# Patient Record
Sex: Male | Born: 1950 | Race: White | Hispanic: No | Marital: Married | Smoking: Current every day smoker
Health system: Southern US, Community
[De-identification: ages and names within clinical notes are randomized; demographics above are authoritative.]

## PROBLEM LIST (undated history)

## (undated) DIAGNOSIS — N289 Disorder of kidney and ureter, unspecified: Secondary | ICD-10-CM

## (undated) DIAGNOSIS — E079 Disorder of thyroid, unspecified: Secondary | ICD-10-CM

## (undated) DIAGNOSIS — C229 Malignant neoplasm of liver, not specified as primary or secondary: Secondary | ICD-10-CM

## (undated) DIAGNOSIS — I639 Cerebral infarction, unspecified: Secondary | ICD-10-CM

## (undated) DIAGNOSIS — I10 Essential (primary) hypertension: Secondary | ICD-10-CM

## (undated) DIAGNOSIS — E119 Type 2 diabetes mellitus without complications: Secondary | ICD-10-CM

## (undated) DIAGNOSIS — E785 Hyperlipidemia, unspecified: Secondary | ICD-10-CM

## (undated) HISTORY — PX: HAND TENDON SURGERY: SHX663

## (undated) HISTORY — DX: Cerebral infarction, unspecified: I63.9

## (undated) HISTORY — DX: Malignant neoplasm of liver, not specified as primary or secondary: C22.9

## (undated) HISTORY — DX: Disorder of kidney and ureter, unspecified: N28.9

## (undated) HISTORY — PX: CARDIAC CATHETERIZATION: SHX172

## (undated) HISTORY — PX: CARPAL TUNNEL RELEASE: SHX101

---

## 2001-07-02 DIAGNOSIS — M7710 Lateral epicondylitis, unspecified elbow: Secondary | ICD-10-CM | POA: Insufficient documentation

## 2003-01-09 DIAGNOSIS — I10 Essential (primary) hypertension: Secondary | ICD-10-CM | POA: Insufficient documentation

## 2004-08-03 ENCOUNTER — Emergency Department (HOSPITAL_COMMUNITY): Admission: EM | Admit: 2004-08-03 | Discharge: 2004-08-03 | Payer: Self-pay | Admitting: Emergency Medicine

## 2005-09-22 DIAGNOSIS — I447 Left bundle-branch block, unspecified: Secondary | ICD-10-CM | POA: Insufficient documentation

## 2006-03-14 ENCOUNTER — Emergency Department (HOSPITAL_COMMUNITY): Admission: EM | Admit: 2006-03-14 | Discharge: 2006-03-14 | Payer: Self-pay | Admitting: Emergency Medicine

## 2006-03-14 IMAGING — CR DG KNEE COMPLETE 4+V*L*
4 series · 4 of 4 positions shown · non-contrast
Comparison: none

CLINICAL DATA: Left knee injury, pain, limited range of motion

LEFT KNEE - 4 VIEW

[t knee lat left]
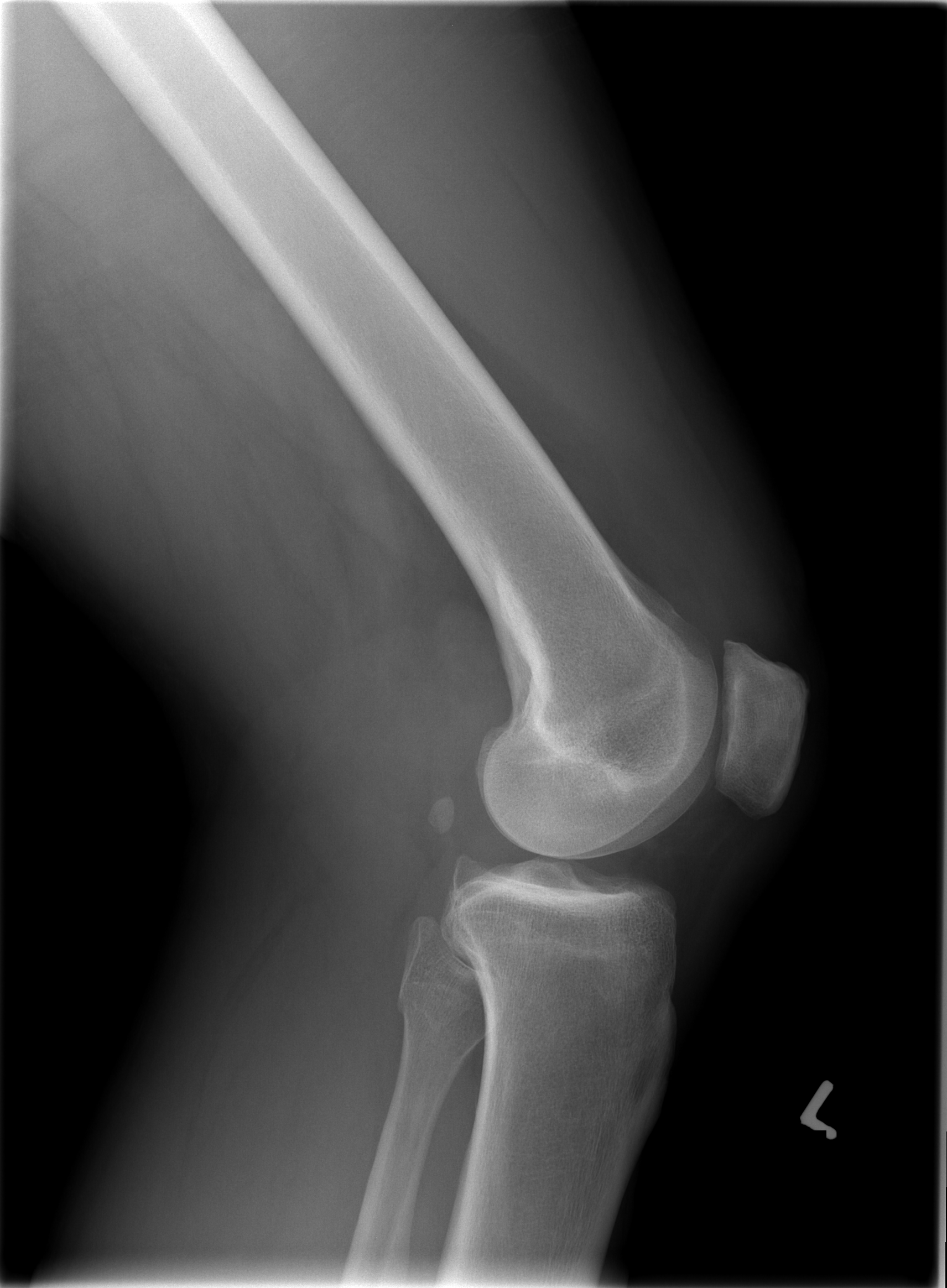

[t knee oblique left (1 of 2)]
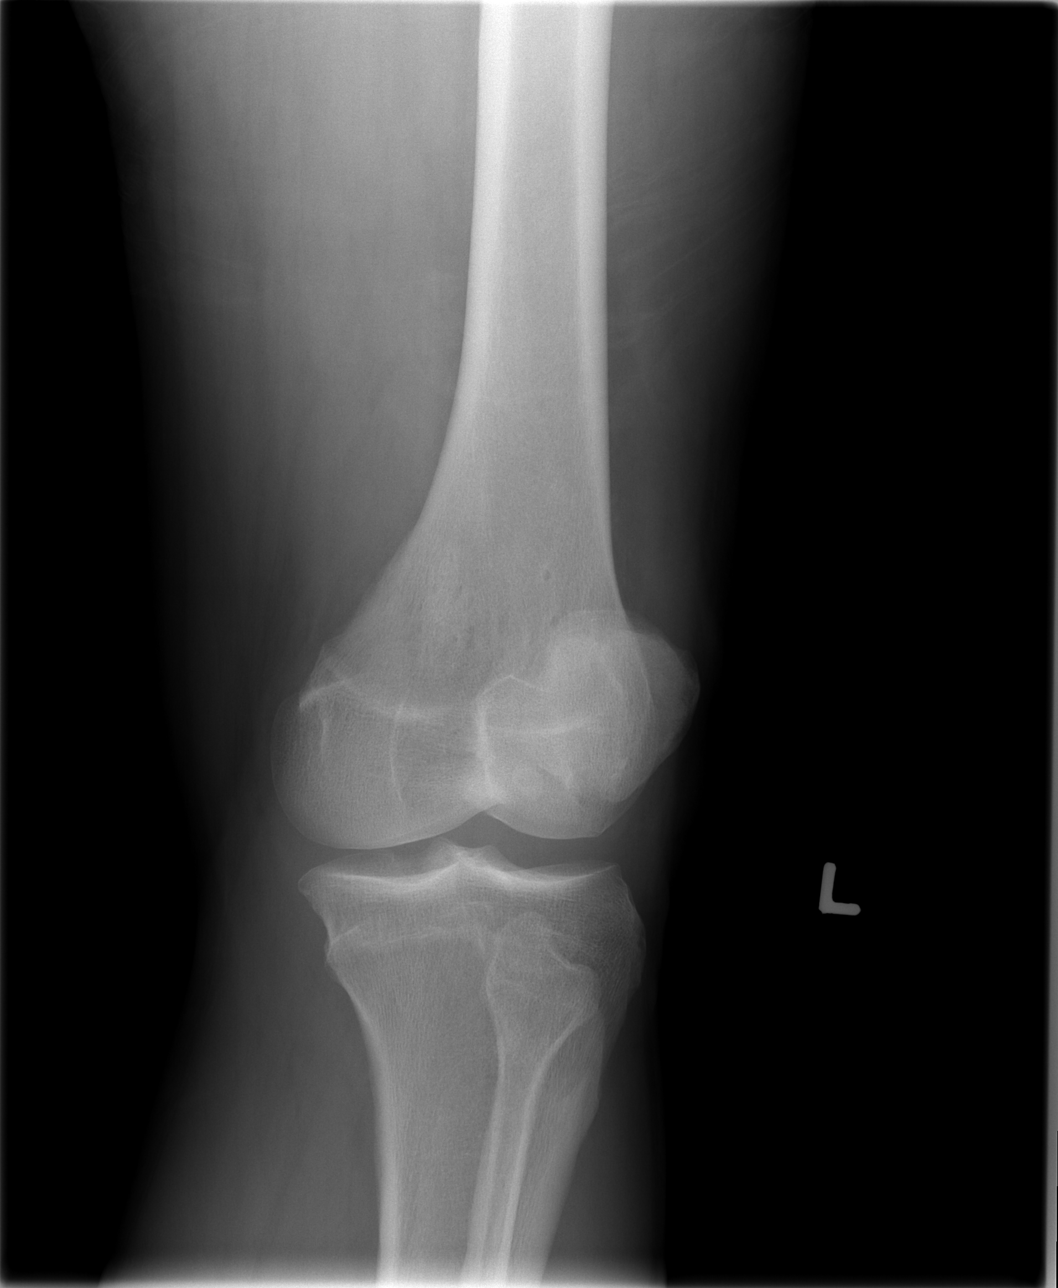

[t knee ap left]
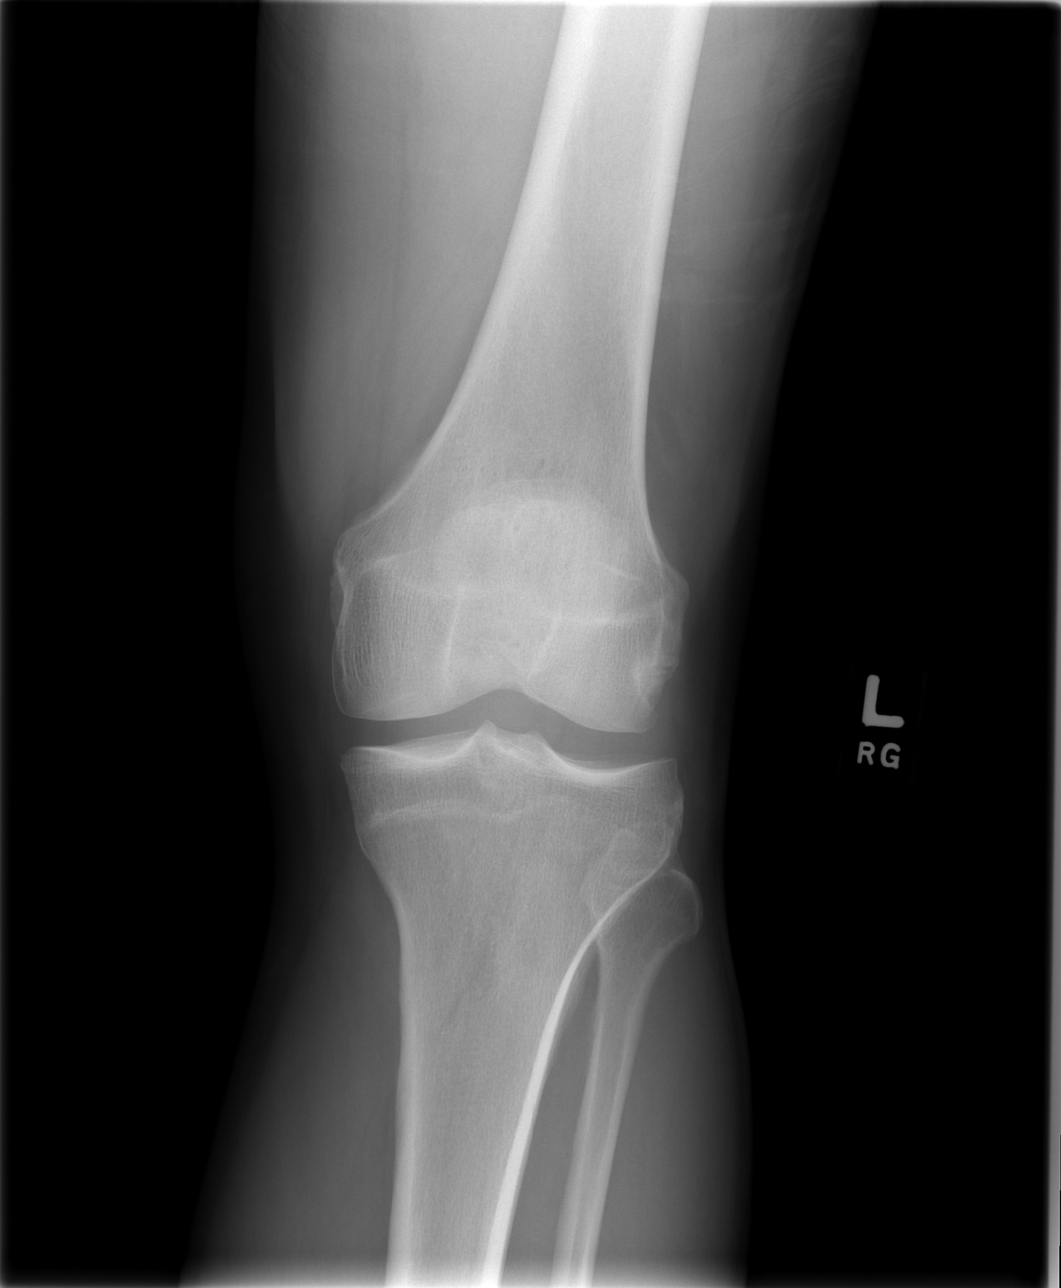

[t knee oblique left (2 of 2)]
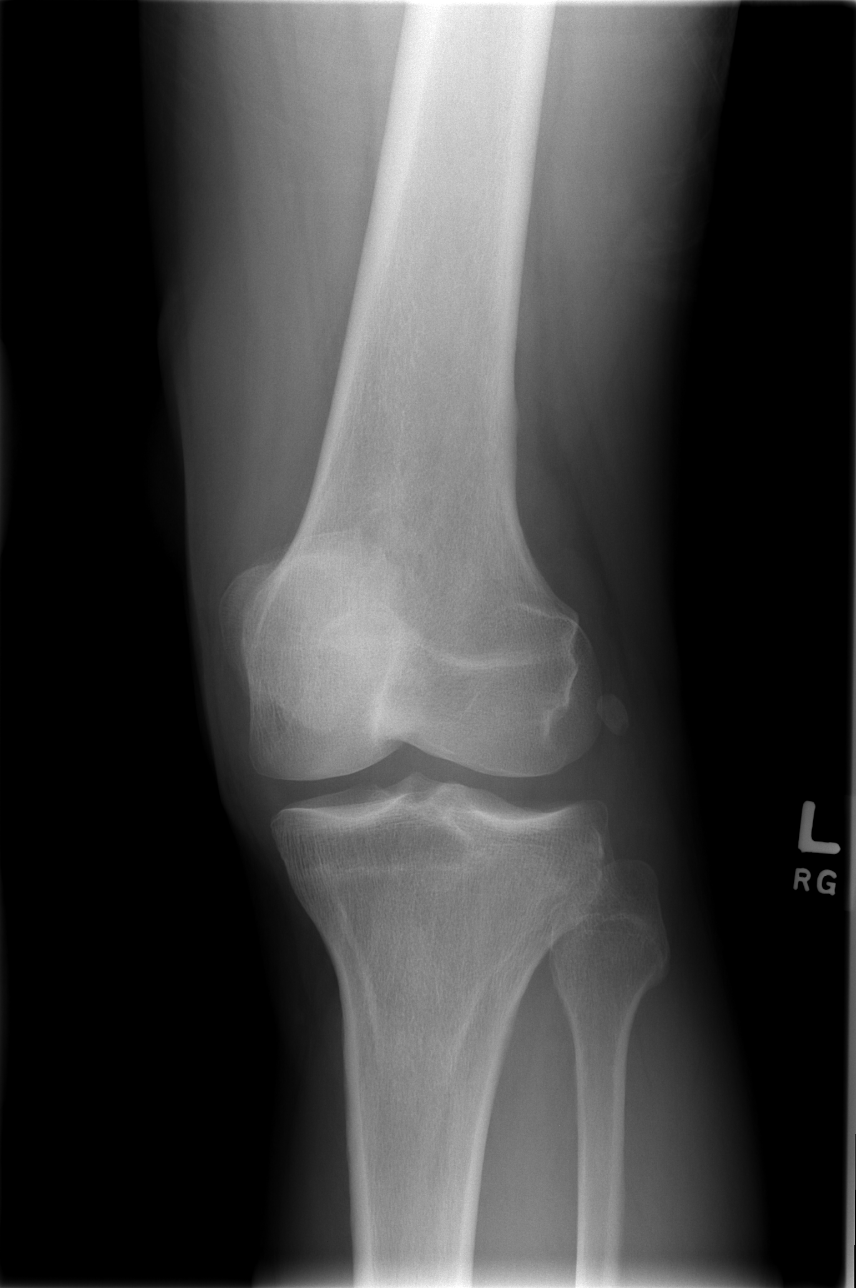

[4 of 4 positions shown; findings below may reference images not displayed]

FINDINGS: No acute bony abnormality. Specifically, no fracture, subluxation, or
dislocation. Soft tissues intact.

IMPRESSION

No acute bony abnormality.

## 2006-12-19 DIAGNOSIS — E669 Obesity, unspecified: Secondary | ICD-10-CM | POA: Insufficient documentation

## 2007-03-23 IMAGING — CR DG CHEST 1V PORT
1 series · 1 of 1 positions shown · non-contrast
Comparison: No priors for comparison.

CLINICAL DATA: Syncope and shortness of breath.
 PORTABLE CHEST - 1 VIEW:

[view not recorded]
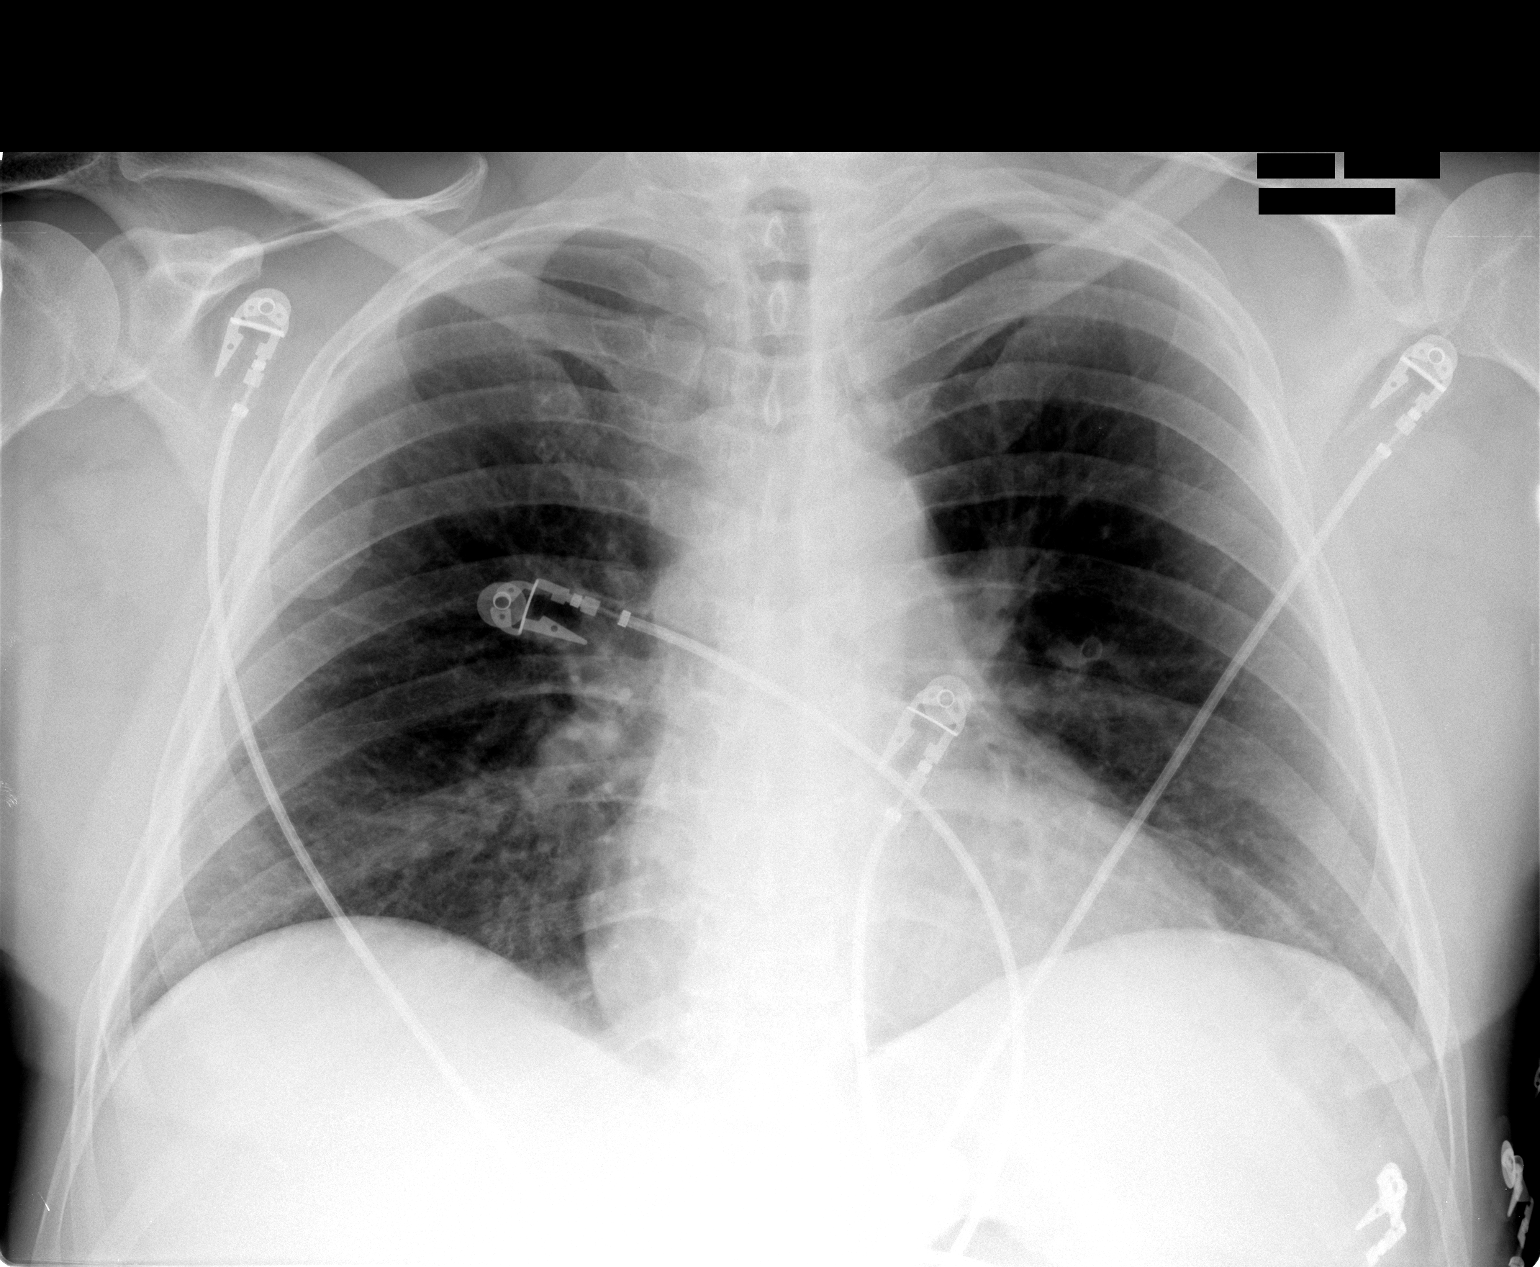

[1 of 1 positions shown; findings below may reference images not displayed]

FINDINGS: The lung volumes are slightly low.  Minimal linear atelectasis in the lingula.  Heart and mediastinal contours are within normal limits for portable technique.  No pneumothorax, focal airspace disease, edema, or effusion.
IMPRESSION: Low lung volumes with minimal atelectasis in the lingula.

## 2007-09-23 ENCOUNTER — Ambulatory Visit: Payer: Self-pay | Admitting: Internal Medicine

## 2007-12-16 ENCOUNTER — Emergency Department (HOSPITAL_COMMUNITY): Admission: EM | Admit: 2007-12-16 | Discharge: 2007-12-16 | Payer: Self-pay | Admitting: Emergency Medicine

## 2008-09-25 ENCOUNTER — Ambulatory Visit: Payer: Self-pay | Admitting: Gastroenterology

## 2008-10-19 ENCOUNTER — Ambulatory Visit: Payer: Self-pay | Admitting: Gastroenterology

## 2008-12-01 ENCOUNTER — Observation Stay (HOSPITAL_COMMUNITY): Admission: AD | Admit: 2008-12-01 | Discharge: 2008-12-02 | Payer: Self-pay | Admitting: Cardiology

## 2008-12-08 DIAGNOSIS — R945 Abnormal results of liver function studies: Secondary | ICD-10-CM | POA: Insufficient documentation

## 2008-12-10 DIAGNOSIS — E559 Vitamin D deficiency, unspecified: Secondary | ICD-10-CM | POA: Insufficient documentation

## 2009-04-16 ENCOUNTER — Encounter: Admission: RE | Admit: 2009-04-16 | Discharge: 2009-04-16 | Payer: Self-pay | Admitting: Orthopedic Surgery

## 2009-08-20 ENCOUNTER — Emergency Department (HOSPITAL_COMMUNITY): Admission: EM | Admit: 2009-08-20 | Discharge: 2009-08-20 | Payer: Self-pay | Admitting: Emergency Medicine

## 2009-08-20 IMAGING — CR DG CHEST 2V
2 series · 2 of 2 positions shown · non-contrast
Comparison: [DATE]

CLINICAL DATA: Hypertensive.  Diaphoretic.  Smoker.

CHEST - 2 VIEW

[w chest pa]
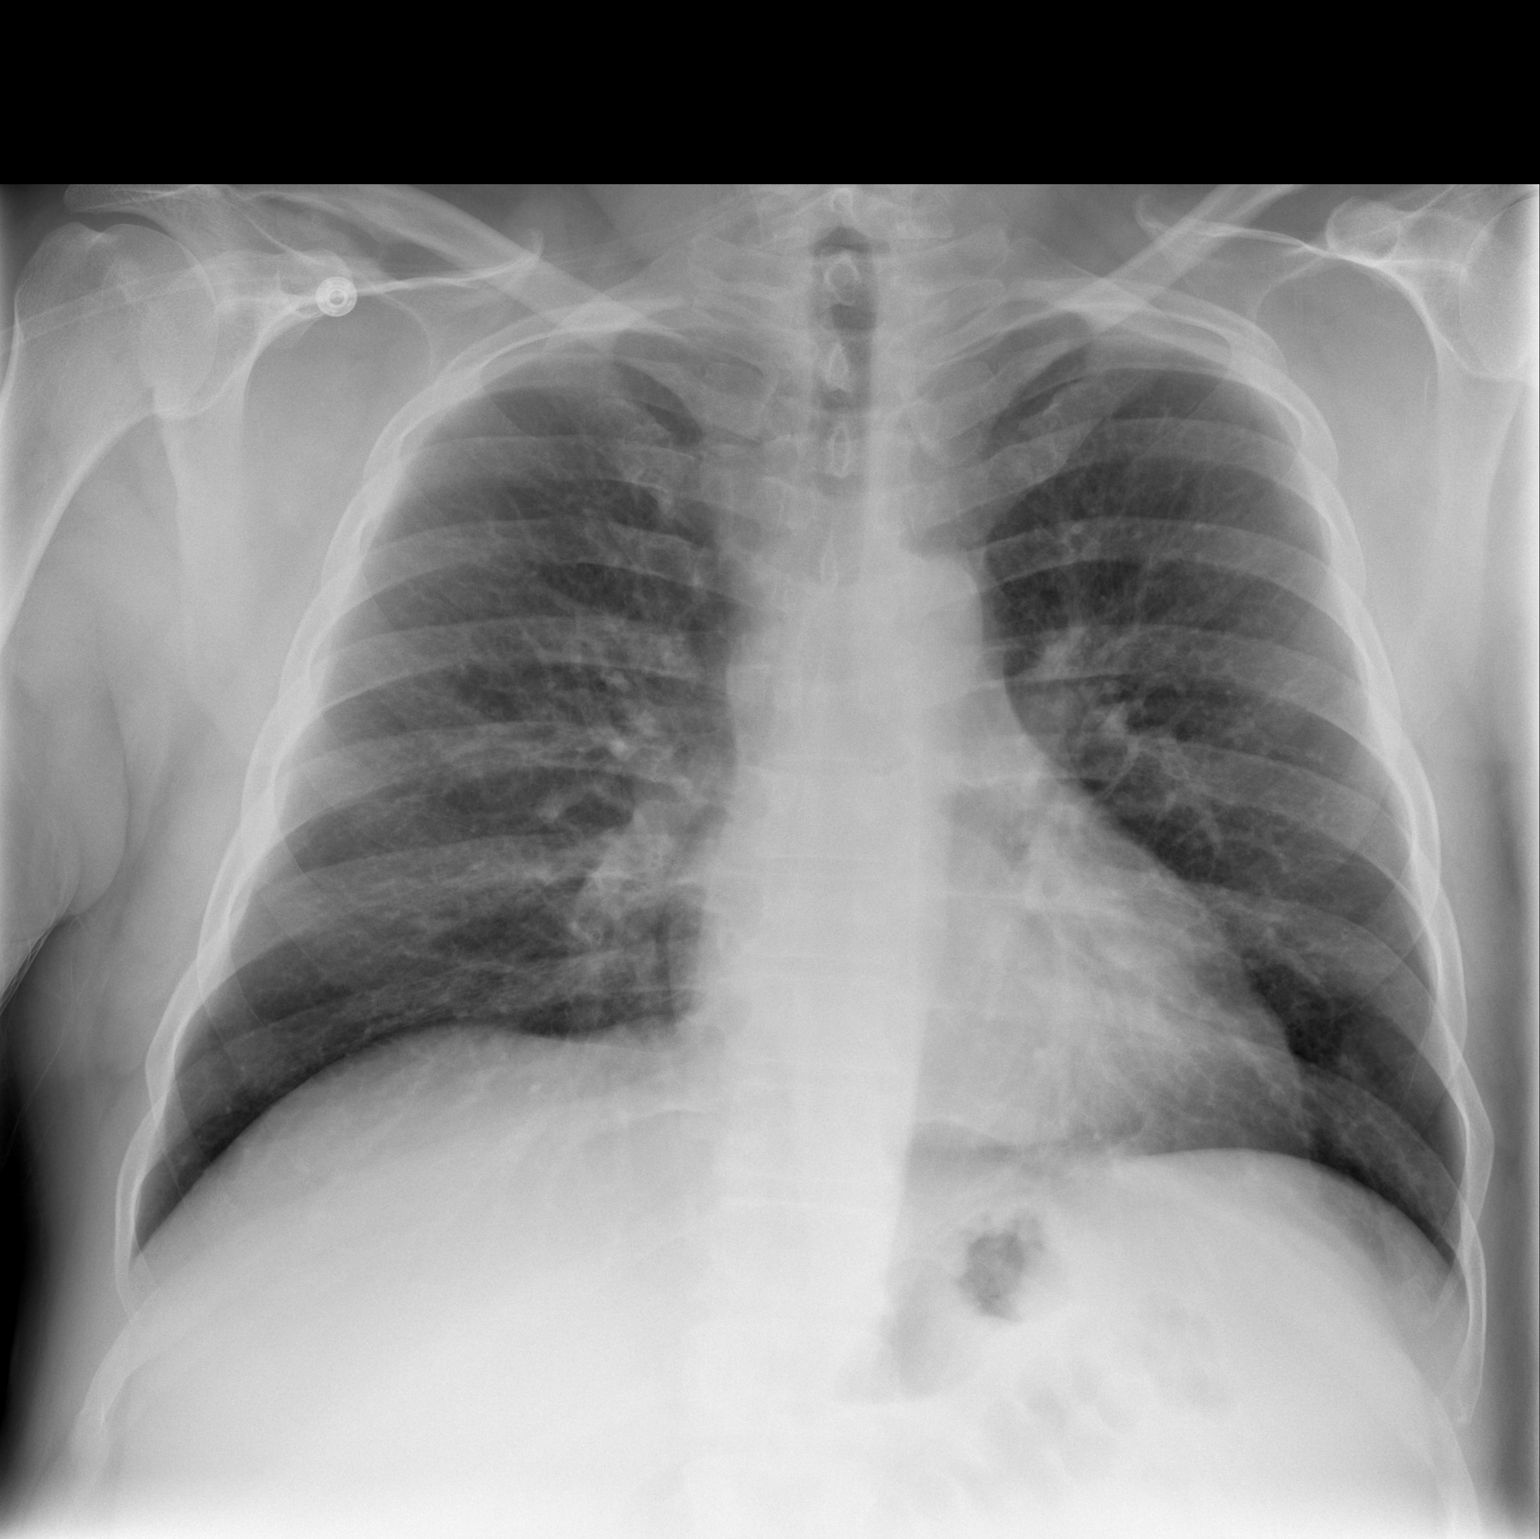

[w chest lat]
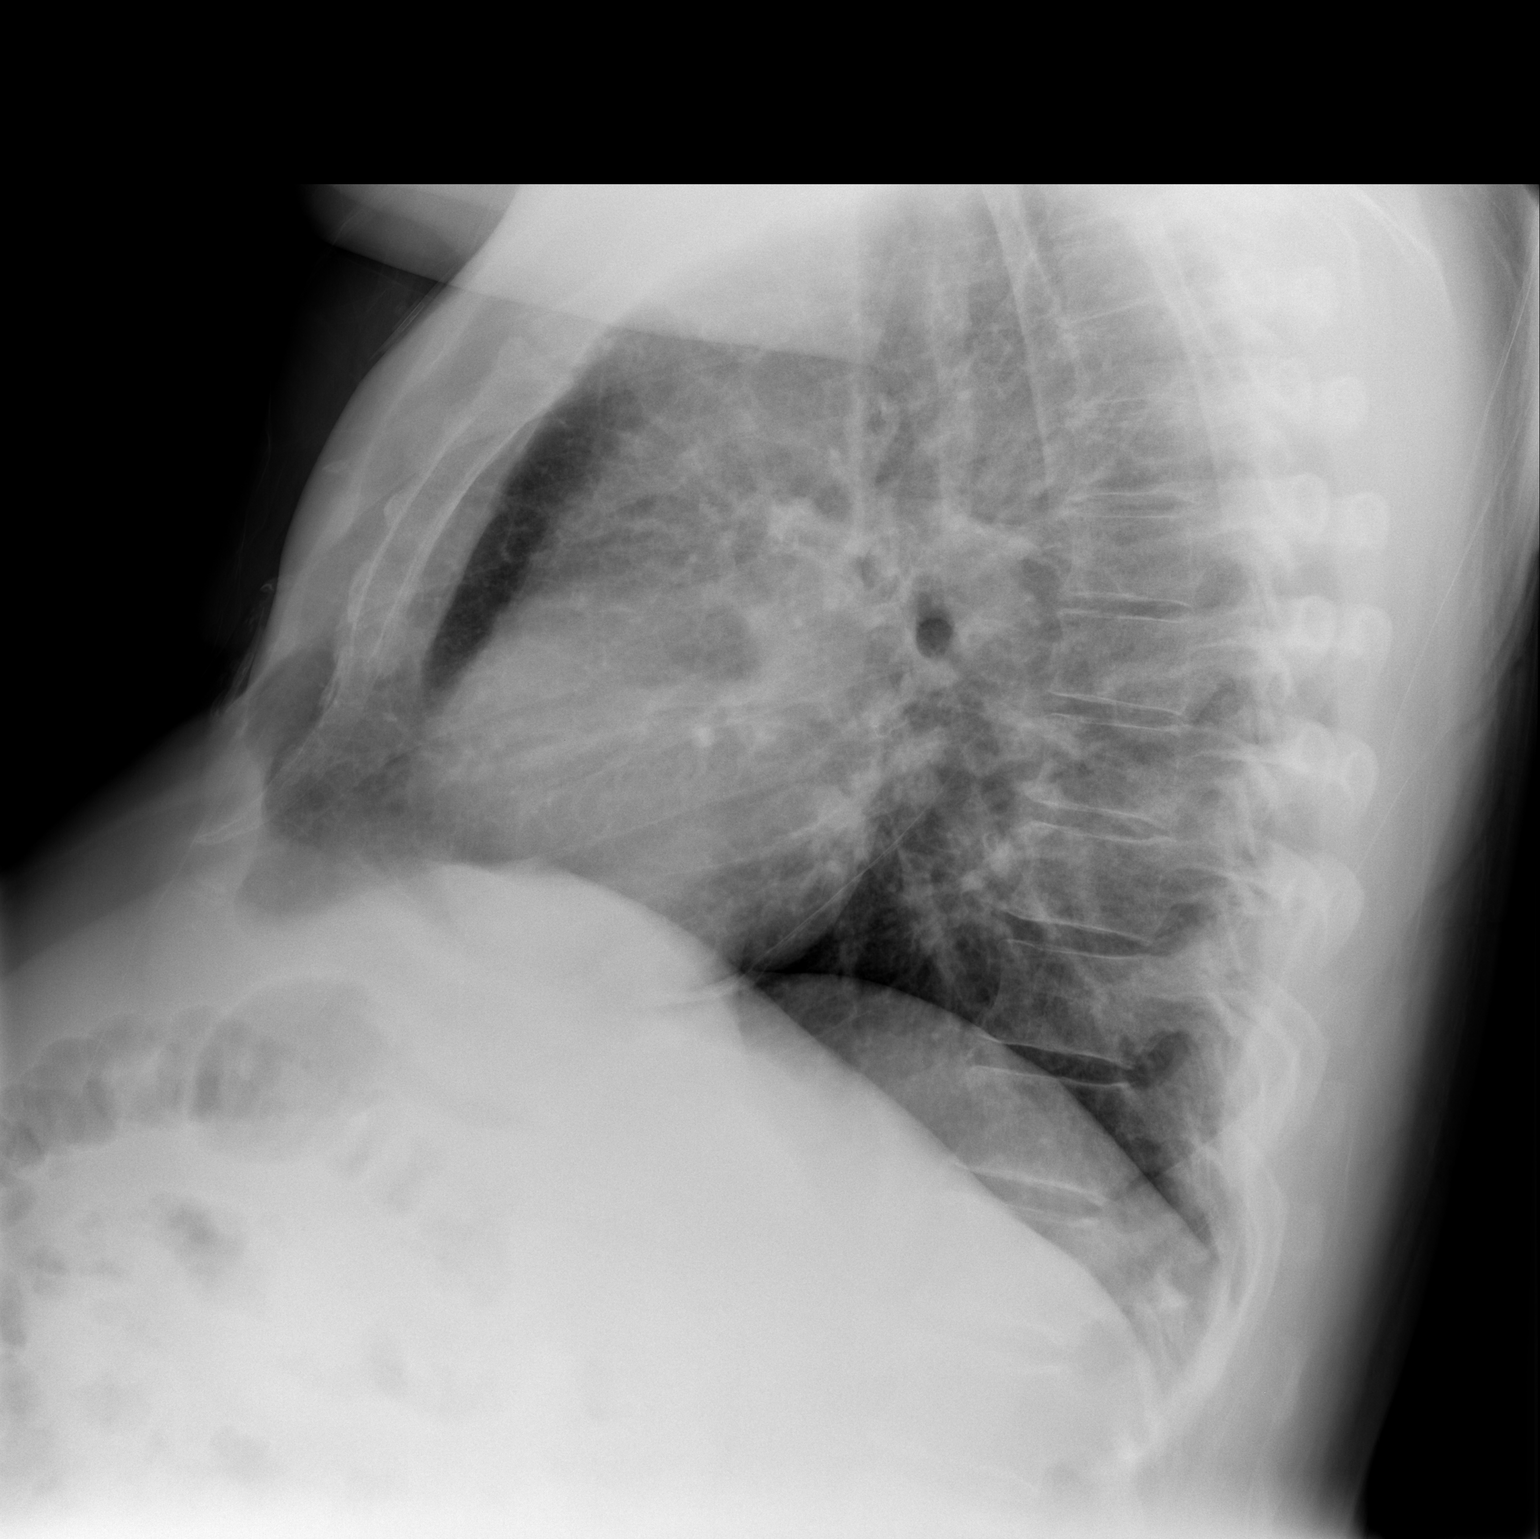

[2 of 2 positions shown; findings below may reference images not displayed]

FINDINGS: The heart size and mediastinal contours are within
normal limits.  Both lungs are clear.  The visualized skeletal
structures are unremarkable.
IMPRESSION: No active cardiopulmonary disease.

## 2011-03-03 LAB — POCT CARDIAC MARKERS
CKMB, poc: 1.4 ng/mL (ref 1.0–8.0)
Troponin i, poc: <0.05 ng/mL (ref 0.00–0.09)

## 2011-03-03 LAB — POCT I-STAT, CHEM 8
HCT: 39 % (ref 39.0–52.0)
Hemoglobin: 13.3 g/dL (ref 13.0–17.0)

## 2011-03-03 LAB — GLUCOSE, CAPILLARY: Glucose-Capillary: 118 mg/dL — ABNORMAL HIGH (ref 70–99)

## 2011-03-13 LAB — COMPREHENSIVE METABOLIC PANEL
AST: 45 U/L — ABNORMAL HIGH (ref 0–37)
AST: 53 U/L — ABNORMAL HIGH (ref 0–37)
Albumin: 3.8 g/dL (ref 3.5–5.2)
Alkaline Phosphatase: 47 U/L (ref 39–117)
Alkaline Phosphatase: 52 U/L (ref 39–117)
BUN: 14 mg/dL (ref 6–23)
BUN: 17 mg/dL (ref 6–23)
CO2: 24 mEq/L (ref 19–32)
CO2: 27 mEq/L (ref 19–32)
Calcium: 9.1 mg/dL (ref 8.4–10.5)
Chloride: 102 mEq/L (ref 96–112)
Creatinine, Ser: 0.82 mg/dL (ref 0.4–1.5)
GFR calc Af Amer: >60 mL/min (ref >60)
GFR calc non Af Amer: >60 mL/min (ref >60)
Glucose, Bld: 162 mg/dL — ABNORMAL HIGH (ref 70–99)
Potassium: 4.3 mEq/L (ref 3.5–5.1)
Sodium: 136 mEq/L (ref 135–145)
Total Protein: 6.5 g/dL (ref 6.0–8.3)
Total Protein: 6.6 g/dL (ref 6.0–8.3)

## 2011-03-13 LAB — DIFFERENTIAL
Basophils Absolute: 0 10*3/uL (ref 0.0–0.1)
Basophils Relative: 0 % (ref 0–1)
Lymphocytes Relative: 11 % — ABNORMAL LOW (ref 12–46)
Monocytes Absolute: 0.7 10*3/uL (ref 0.1–1.0)
Monocytes Relative: 7 % (ref 3–12)
Neutrophils Relative %: 82 % — ABNORMAL HIGH (ref 43–77)

## 2011-03-13 LAB — TSH: TSH: 1.606 u[IU]/mL (ref 0.350–4.500)

## 2011-03-13 LAB — GLUCOSE, CAPILLARY
Glucose-Capillary: 128 mg/dL — ABNORMAL HIGH (ref 70–99)
Glucose-Capillary: 161 mg/dL — ABNORMAL HIGH (ref 70–99)
Glucose-Capillary: 173 mg/dL — ABNORMAL HIGH (ref 70–99)

## 2011-03-13 LAB — URINALYSIS, ROUTINE W REFLEX MICROSCOPIC
Glucose, UA: NEGATIVE mg/dL
Ketones, ur: NEGATIVE mg/dL
Protein, ur: NEGATIVE mg/dL
Specific Gravity, Urine: >1.046 — ABNORMAL HIGH (ref 1.005–1.030)
pH: 5 (ref 5.0–8.0)

## 2011-03-13 LAB — CBC
MCHC: 35.1 g/dL (ref 30.0–36.0)
MCV: 91 fL (ref 78.0–100.0)
Platelets: 216 10*3/uL (ref 150–400)
RBC: 4.34 MIL/uL (ref 4.22–5.81)
RDW: 13 % (ref 11.5–15.5)

## 2011-03-13 LAB — MAGNESIUM: Magnesium: 1.6 mg/dL (ref 1.5–2.5)

## 2011-03-13 LAB — LIPID PANEL
HDL: 30 mg/dL — ABNORMAL LOW (ref >39)
Total CHOL/HDL Ratio: 5.3 RATIO

## 2011-04-11 NOTE — H&P (Signed)
NAMESHAE, AUGELLO                 ACCOUNT NO.:  [id number]   MEDICAL RECORD NO.:  [id number]          PATIENT TYPE:  OIB   LOCATION:  2028                         FACILITY:  [facility]   PHYSICIAN:  Brentyn Seehafer. Jacinto Halim, MD       DATE OF BIRTH:  27-Apr-1951   DATE OF ADMISSION:  12/01/2008  DATE OF DISCHARGE:                              HISTORY & PHYSICAL   [name] is a 60 year old who came in as an acute MI.  He  apparently had a lot of alcohol to drink last night including 6 Corona's  and whiskey.  At 6 a.m. he woke up and he felt nauseated.  He tried to  throw up, he felt weak like he might pass out.  He was diaphoretic.  He  had some chest discomfort.  He called EMS.  EMS did a 12-lead and a code  STEMI was called.  He got 81 mg of aspirin x4 and 3 sublingual  nitroglycerin.   PRIOR MEDICAL HISTORY:  1. Hypertension.  2. NIDDM.  3. Anxiety.  4. Hyperlipidemia.   MEDICATIONS:  1. Lipitor 20 mg everyday.  2. Glimepiride 2 mg b.i.d.  3. Lovaza 1 g two b.i.d.  4. Hydrochlorothiazide 25 mg everyday.  5. Metformin 1000 mg b.i.d.  6. Actos 30 mg everyday.  7. Diovan 160 mg everyday.  8. Aspirin 81 mg everyday.  9. Multivitamin everyday.   ALLERGIES:  NKDA.   FAMILY HISTORY:  Mother died at age 45 of colon cancer.  Father had an  MI at age 33 and died at age 51, one brother died of electrocution, one  brother  is alive and one sister alive with no CAD.   SOCIAL HISTORY:  He quit smoking 5 months ago.  He is married, has 3  children, 5 grandchildren.  He is starting a new job on Thursday.  He is  a truck Sales promotion account executive.  He has been walking everyday one to one  and a half miles everyday.  He moved to [place] about 4 years ago  from [place], [place].  He still sees his primary care doctor, Dr.  Sondra Barges in [place].   REVIEW OF SYSTEMS:  He denies any recent chest pain, shortness of  breath.  No syncope, no presyncope, no dizziness, no lightheadedness, no  indigestion or heartburn.  No black tarry stools.  No lower extremity  edema.  No unilateral weakness.  Other systems are negative.   PHYSICAL EXAMINATION:  VITAL SIGNS:  Blood pressure 130/70, heart rate  76.  GENERAL:  He is an obese male in no acute distress.  He was rather  nauseous.  HEENT:  Pupils equal and round.  NECK:  No thyromegaly, no adenopathy.  LUNGS:  Clear to auscultation with fair inspiratory effort.  CARDIAC:  Heart sounds regular, S1-S2 present.  No murmur or gallop is  noted.  GI:  Bowel sounds present x4.  No mass or tenderness.  LOWER EXTREMITIES:  No edema.  NEURO:  Alert and oriented x3.  MUSCULOSKELETAL:  Moves all extremities x4.  Lower extremities, no  edema.   EKG shows left bundle branch block.   ASSESSMENT:  1. New left bundle branch block.  2. Nausea, weakness and presyncope.  3. Hypertension.  4. Non-insulin-dependent diabetes mellitus.  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   PLAN:  He was seen and evaluated by Dr. Jacinto Halim prior to emergent cardiac  catheterization.  The risks and benefits explained including death, MI,  stroke or need for urgent cardiac catheterization.      [name], N.P.      Cristy Hilts. Jacinto Halim, MD  Electronically Signed    BB/MEDQ  D:  12/01/2008  T:  12/01/2008  Job:  [id number]   cc:   Sondra Barges, MD

## 2011-04-11 NOTE — Discharge Summary (Signed)
NAME:  Bryce Adams, Bryce Adams                 ACCOUNT NO.:  [id number]   MEDICAL RECORD NO.:  [id number]          PATIENT TYPE:  OIB   LOCATION:  2028                         FACILITY:  [facility]   PHYSICIAN:  Thereasa Solo. Little, M.D. DATE OF BIRTH:  05/09/1951   DATE OF ADMISSION:  12/01/2008  DATE OF DISCHARGE:  12/02/2008                               DISCHARGE SUMMARY   DISCHARGE DIAGNOSES:  1. Chest pain, not coronary artery ischemia with cardiac      catheterization done on December 01, 2008 with normal coronary      arteries, normal left ventricular function, ejection fraction of 60-      65%.  2. Hypertension.  3. Non-insulin-dependent diabetes mellitus.  4. Familial history of coronary artery disease.   LABORATORY DATA:  Sodium 136, potassium 4.3, chloride 102, CO2 27,  glucose 179, BUN 14, and creatinine 0.80.  Hemoglobin 13.6, hematocrit  38.7, WBCs 9.2, and platelets 234.  TSH 1.606.  Hemoglobin A1c 7.3.  Total cholesterol was 159, triglycerides 258, HDL 30, and LDL was 77.  Magnesium 1.6.  Alcohol level less than 5.  UA was negative.  Chest x-  ray not in computer at the time of this dictation.   HOSPITAL COURSE:  [name] is a 60 year old male patient who came  in by EMS as a code STEMI.  He had been doing well at home in good  health until he drank a lot of alcohol and in the morning he had some  chest discomfort with diaphoresis and a near syncopal episode.  Thus,  EMS was called to his house and he apparently had an EKG that showed  left bundle-branch block and because of his multiple cardiac risk  factors, a code STEMI was called.  He was brought to the Cardiac  Catheterization Lab, where he was seen by Dr. Jacinto Halim and a cardiac  catheterization was performed.  This showed normal coronary arteries and  EF of 60-65%.  He was hydrated overnight and ready for discharge on  December 02, 2008.  His blood pressure was 143/85, heart rate 86,  respirations 20, and temperature was 97.2.   His renal function was  normal and he was considered stable for discharge home.  He was seen by  Dr. Julieanne Manson on the day of discharge.   DISCHARGE MEDICATIONS:  1. Hydrochlorothiazide 25 mg daily.  2. Lovaza 1 gram twice a day.  3. Lipitor 20 mg daily.  4. Multivitamin 1 tablet daily.  5. Glyburide, he takes on a p.r.n. basis for his blood sugar.  6. Metformin 1000 mg twice a day.  He was told to hold this until      Thursday night.  7. Actos 30 mg a day.  8. Diovan 160 mg.  9. Aspirin 81 mg a day.   He was told he can take Tylenol for any groin discomfort.  If he has any  problems with his groin, he may call his office and number was given to  him.  He should follow up with his physician, Dr. Kathlee Nations in  [place],  [place].  He should do no strenuous activity until Sunday and he should  do no driving today.  He is to start a new job on Thursday, however, he  will be in orientation until Sunday.  He had a [employer] procedure and  he should not sit in a tub of water for 7 days.      [name], N.P.    ______________________________  Thereasa Solo. Little, M.D.    BB/MEDQ  D:  12/02/2008  T:  12/02/2008  Job:  [id number]   cc:   Kathlee Nations

## 2011-04-11 NOTE — Cardiovascular Report (Signed)
NAMEAVIV, LENGACHER                 ACCOUNT NO.:  [id number]   MEDICAL RECORD NO.:  [id number]          PATIENT TYPE:  OIB   LOCATION:  2028                         FACILITY:  [facility]   PHYSICIAN:  Keyondre Hepburn. Jacinto Halim, MD       DATE OF BIRTH:  1951/02/19   DATE OF PROCEDURE:  DATE OF DISCHARGE:                            CARDIAC CATHETERIZATION   PROCEDURE DATE:  December 01, 2008.   PROCEDURES PERFORMED:  1. Left ventriculography.  2. Selective right and left coronary arteriography.  3. Ascending aortogram.  4. Abdominal aortogram.   INDICATIONS:  Mr. Bala is a 60 year old obese white male, who lives  in [place], [place], with a history of hypertension, diabetes,  hyperlipidemia.  He was doing well until this morning when he woke up  feeling hung over from last night binge party.  He also had some mild  chest discomfort associated with diaphoresis and had near syncopal  episode.  He had activated the EMS, and the EMS found that he had a left  bundle-branch block.  Given his multiple cardiovascular risk factors, a  STEMI code was activated, and he was brought directly to the cardiac  catheterization lab as a STEMI.   A left heart catheterization is now being performed for definite  diagnosis of coronary disease.  Ascending aortogram was performed to  evaluate for ascending aortic aneurysm and aortic dissection.  Abdominal  aortogram for abdominal atherosclerosis.  He was also hypertensive on  admission with a diastolic blood pressure of 98 mmHg.   HEMODYNAMIC DATA:  The left ventricular pressure was 125/-2 with an end-  diastolic pressure of 4 mmHg.  Aortic pressure was 123/69 with a mean of  90 mmHg.  There was no pressure gradient across the aortic valve.   ANGIOGRAPHIC DATA:  Left ventricle:  Left ventricular systolic function  was normal with ejection fraction of 60-65%.  There was no significant  mitral regurgitation.   Right coronary artery:  Right coronary artery is a  large-caliber vessel  and dominant vessel.  It is smooth and normal.   Left main coronary artery:  Left main coronary artery is large-caliber  vessel.  Smooth and normal.   LAD:  LAD is a large-caliber vessel.  Gives origin to large diagonal 1.  It is smooth and normal and ends at the apex.   Circumflex coronary artery:  Circumflex coronary artery is a large-  caliber vessel.  Gives origin to moderate-sized obtuse marginal 1 and  large obtuse marginal 2.  The circumflex coronary artery is smooth and  normal.   Ascending aortogram:  Ascending aortogram revealed presence of 3 aortic  valve cusps.  There was no evidence of aortic regurgitation, no evidence  of aortic dissection.   Abdominal aortogram:  Abdominal aortogram revealed presence of 2 renal  arteries, one on either sides.  They were widely patent.  There was no  evidence of abdominal aortic aneurysm.  Aortoiliac bifurcation was  widely patent.   IMPRESSION/PLAN:  Mr. Dail Lerew is a 60 year old obese, diabetic,  hypertensive, hyperlipidemic male who presents with abnormal EKG with  a  left bundle-branch block.  I do not see any significant coronary artery  disease.  I suspect his chest discomfort, nausea, diaphoresis are  related to excessive binge alcohol intake that occurred last night.  This is not usual for him.   RECOMMENDATIONS:  The patient will be admitted to the hospital for an  overnight observation.  He will be hydrated and he probably will be  discharged home in the morning, and he will follow up with his primary  care physician, Dr. Kathlee Nations.   Right femoral arteriography was performed.  Arterial access was closed  with [employer] with excellent hemostasis.   A total of 110 mL of contrast was utilized for diagnostic angiography.   TECHNIQUE OF PROCEDURE:  Under usual sterile precautions, using a 6-  French right femoral arterial access, 6-French multipurpose B2 catheter  was advanced into the ascending  aorta, then into the left ventricle.  Left ventriculography was performed both in LAO and RAO projections.  Catheter pulled into the ascending aorta.  Right coronary artery was  selectively engaged and an angiography was performed.  Then left main  coronary artery was selectively engaged and angiography was performed.  Then ascending aortogram was performed in the LAO projection.  Abdominal  aortogram in AP projection.  Catheter pulled out of the body.  Right  femoral arteriography was performed through the arterial access sheath  and access was closed with [employer] with excellent hemostasis.  The  patient tolerated the procedure well.  There were no immediate  complications.      Cristy Hilts. Jacinto Halim, MD  Electronically Signed     JRG/MEDQ  D:  12/01/2008  T:  12/01/2008  Job:  [id number]   cc:   Kathlee Nations

## 2011-08-18 LAB — CBC
MCHC: 35.1
Platelets: 221
RDW: 12.8

## 2011-08-18 LAB — DIFFERENTIAL
Basophils Absolute: 0
Basophils Relative: 0
Eosinophils Relative: 2
Lymphocytes Relative: 16
Monocytes Absolute: 0.6
Neutro Abs: 7.6

## 2011-08-18 LAB — URINALYSIS, ROUTINE W REFLEX MICROSCOPIC
Hgb urine dipstick: NEGATIVE
Nitrite: NEGATIVE
Protein, ur: 100 — AB
Specific Gravity, Urine: 1.023
Urobilinogen, UA: 1
pH: 7

## 2011-08-18 LAB — URINE MICROSCOPIC-ADD ON

## 2011-08-18 LAB — POCT CARDIAC MARKERS
CKMB, poc: <1 — ABNORMAL LOW
Operator id: 285841
Troponin i, poc: <0.05

## 2011-08-18 LAB — I-STAT 8, (EC8 V) (CONVERTED LAB)
Acid-Base Excess: 1
Chloride: 101
Glucose, Bld: 145 — ABNORMAL HIGH
Potassium: 4.5
pCO2, Ven: 46.4
pH, Ven: 7.37 — ABNORMAL HIGH

## 2011-08-18 LAB — POCT I-STAT CREATININE: Operator id: 285841

## 2011-08-29 LAB — GLUCOSE, CAPILLARY
Glucose-Capillary: 149 — ABNORMAL HIGH
Glucose-Capillary: 150 — ABNORMAL HIGH

## 2012-02-02 ENCOUNTER — Ambulatory Visit (INDEPENDENT_AMBULATORY_CARE_PROVIDER_SITE_OTHER): Payer: BC Managed Care – PPO | Admitting: Ophthalmology

## 2012-02-02 DIAGNOSIS — E11319 Type 2 diabetes mellitus with unspecified diabetic retinopathy without macular edema: Secondary | ICD-10-CM

## 2012-02-02 DIAGNOSIS — E1139 Type 2 diabetes mellitus with other diabetic ophthalmic complication: Secondary | ICD-10-CM

## 2012-02-02 DIAGNOSIS — H3530 Unspecified macular degeneration: Secondary | ICD-10-CM

## 2012-02-02 DIAGNOSIS — H2510 Age-related nuclear cataract, unspecified eye: Secondary | ICD-10-CM

## 2012-02-02 DIAGNOSIS — H43819 Vitreous degeneration, unspecified eye: Secondary | ICD-10-CM

## 2013-02-07 ENCOUNTER — Ambulatory Visit (INDEPENDENT_AMBULATORY_CARE_PROVIDER_SITE_OTHER): Payer: BC Managed Care – PPO | Admitting: Ophthalmology

## 2013-02-13 ENCOUNTER — Emergency Department (HOSPITAL_COMMUNITY)
Admission: EM | Admit: 2013-02-13 | Discharge: 2013-02-13 | Disposition: A | Discharge status: Home / Self Care | Payer: BC Managed Care – PPO | Attending: Emergency Medicine | Admitting: Emergency Medicine

## 2013-02-13 ENCOUNTER — Emergency Department (HOSPITAL_COMMUNITY): Payer: BC Managed Care – PPO

## 2013-02-13 ENCOUNTER — Other Ambulatory Visit: Payer: Self-pay

## 2013-02-13 ENCOUNTER — Encounter (HOSPITAL_COMMUNITY): Payer: Self-pay | Admitting: Vascular Surgery

## 2013-02-13 DIAGNOSIS — E119 Type 2 diabetes mellitus without complications: Secondary | ICD-10-CM | POA: Insufficient documentation

## 2013-02-13 DIAGNOSIS — E079 Disorder of thyroid, unspecified: Secondary | ICD-10-CM | POA: Insufficient documentation

## 2013-02-13 DIAGNOSIS — I10 Essential (primary) hypertension: Secondary | ICD-10-CM | POA: Insufficient documentation

## 2013-02-13 DIAGNOSIS — Z7982 Long term (current) use of aspirin: Secondary | ICD-10-CM | POA: Insufficient documentation

## 2013-02-13 DIAGNOSIS — R55 Syncope and collapse: Secondary | ICD-10-CM

## 2013-02-13 DIAGNOSIS — E785 Hyperlipidemia, unspecified: Secondary | ICD-10-CM | POA: Insufficient documentation

## 2013-02-13 DIAGNOSIS — R42 Dizziness and giddiness: Secondary | ICD-10-CM | POA: Insufficient documentation

## 2013-02-13 DIAGNOSIS — F17200 Nicotine dependence, unspecified, uncomplicated: Secondary | ICD-10-CM | POA: Insufficient documentation

## 2013-02-13 DIAGNOSIS — Z79899 Other long term (current) drug therapy: Secondary | ICD-10-CM | POA: Insufficient documentation

## 2013-02-13 HISTORY — DX: Disorder of thyroid, unspecified: E07.9

## 2013-02-13 HISTORY — DX: Type 2 diabetes mellitus without complications: E11.9

## 2013-02-13 HISTORY — DX: Essential (primary) hypertension: I10

## 2013-02-13 HISTORY — DX: Hyperlipidemia, unspecified: E78.5

## 2013-02-13 LAB — POCT I-STAT, CHEM 8
Chloride: 103 mEq/L (ref 96–112)
Glucose, Bld: 159 mg/dL — ABNORMAL HIGH (ref 70–99)
HCT: 39 % (ref 39.0–52.0)
Hemoglobin: 13.3 g/dL (ref 13.0–17.0)
Potassium: 4.5 mEq/L (ref 3.5–5.1)
Sodium: 137 mEq/L (ref 135–145)

## 2013-02-13 LAB — POCT I-STAT TROPONIN I: Troponin i, poc: 0 ng/mL (ref 0.00–0.08)

## 2013-02-13 LAB — CBC WITH DIFFERENTIAL/PLATELET
Basophils Absolute: 0.1 10*3/uL (ref 0.0–0.1)
Basophils Relative: 0 % (ref 0–1)
Eosinophils Absolute: 0.1 10*3/uL (ref 0.0–0.7)
MCH: 32.2 pg (ref 26.0–34.0)
MCHC: 35.8 g/dL (ref 30.0–36.0)
Neutro Abs: 14 10*3/uL — ABNORMAL HIGH (ref 1.7–7.7)
Neutrophils Relative %: 83 % — ABNORMAL HIGH (ref 43–77)
Platelets: 191 10*3/uL (ref 150–400)
RDW: 12.7 % (ref 11.5–15.5)

## 2013-02-13 LAB — GLUCOSE, CAPILLARY: Glucose-Capillary: 167 mg/dL — ABNORMAL HIGH (ref 70–99)

## 2013-02-13 IMAGING — CR DG CHEST 2V
2 series · 2 of 2 positions shown · non-contrast
Comparison: [DATE]

CLINICAL DATA: Syncope

CHEST - 2 VIEW

[w chest pa]
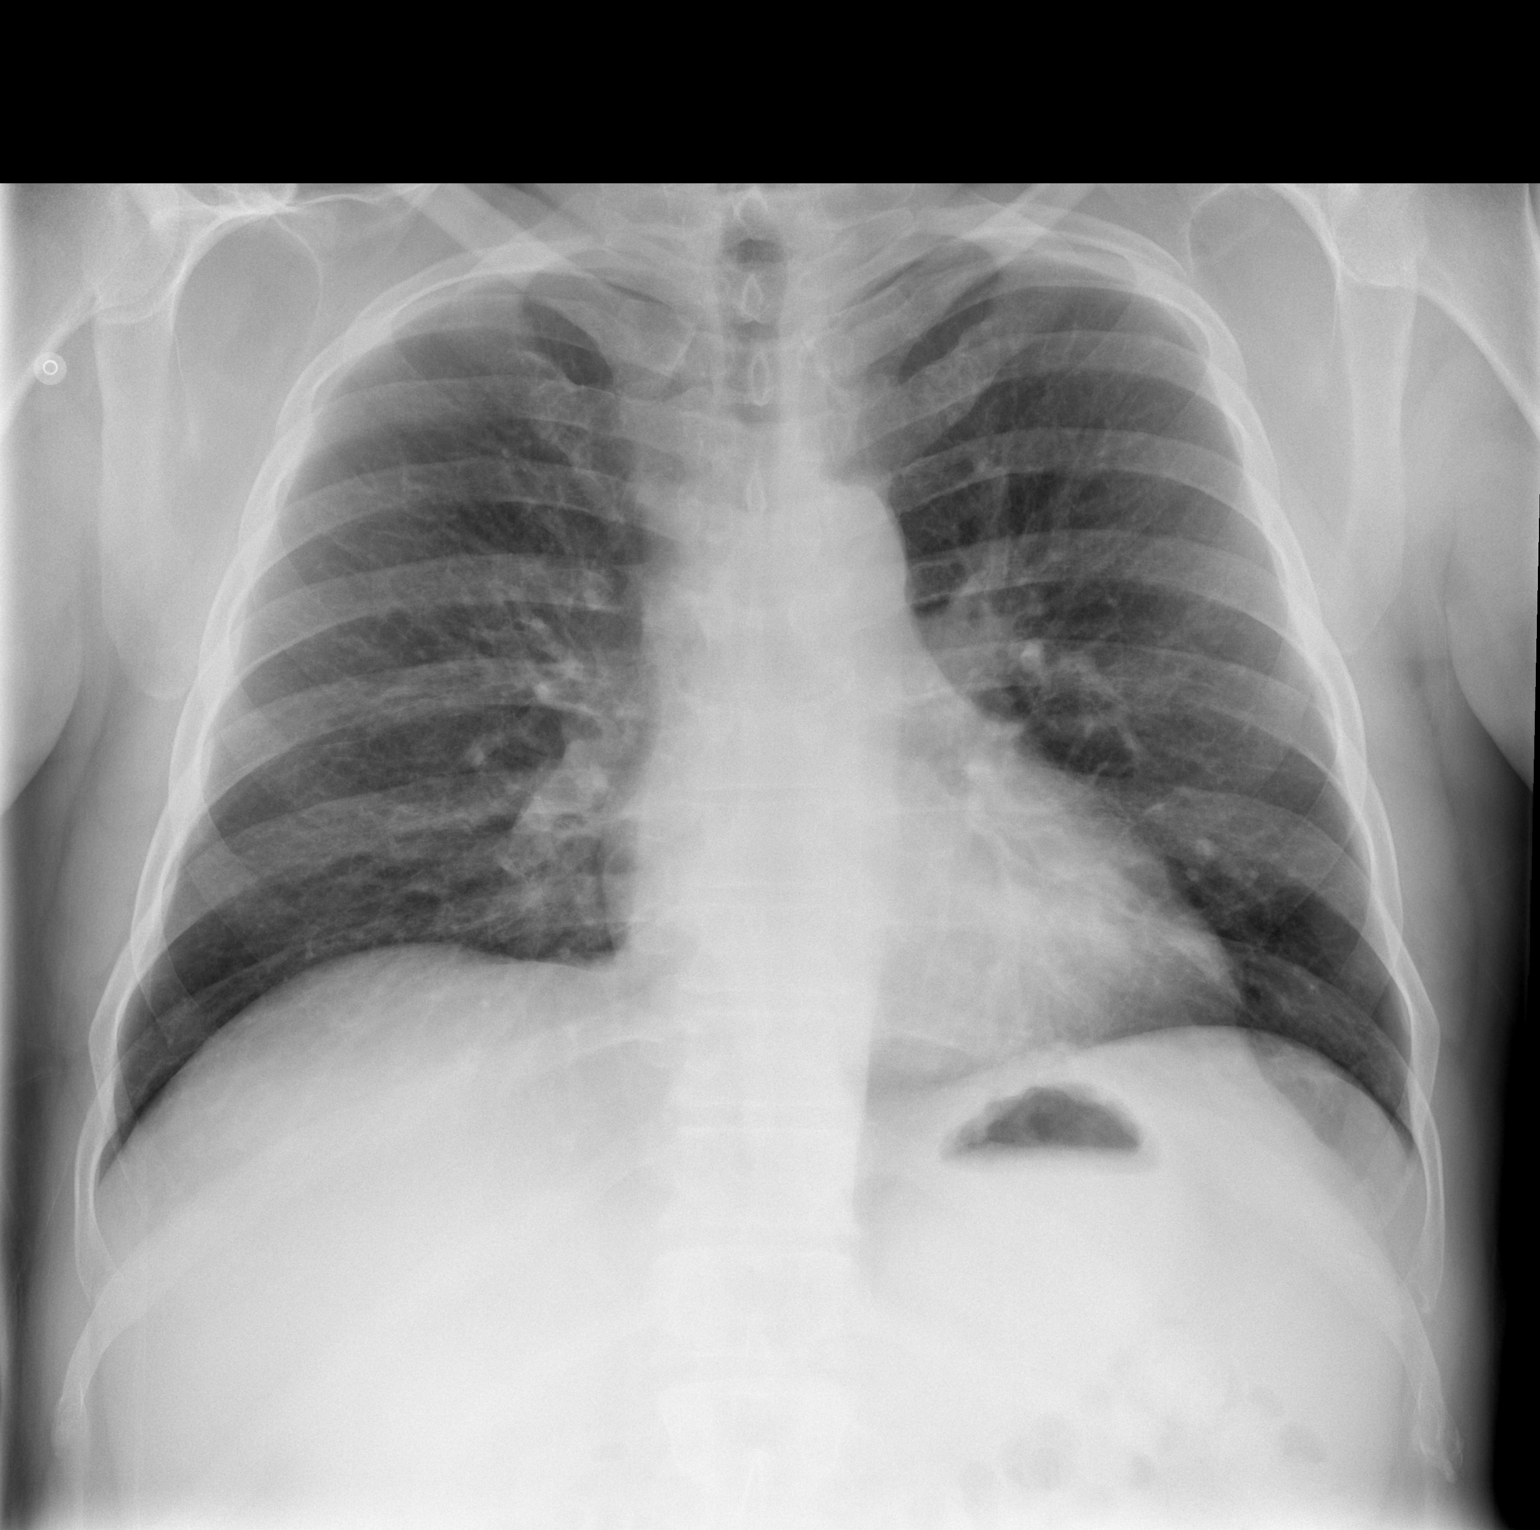

[w chest lat]
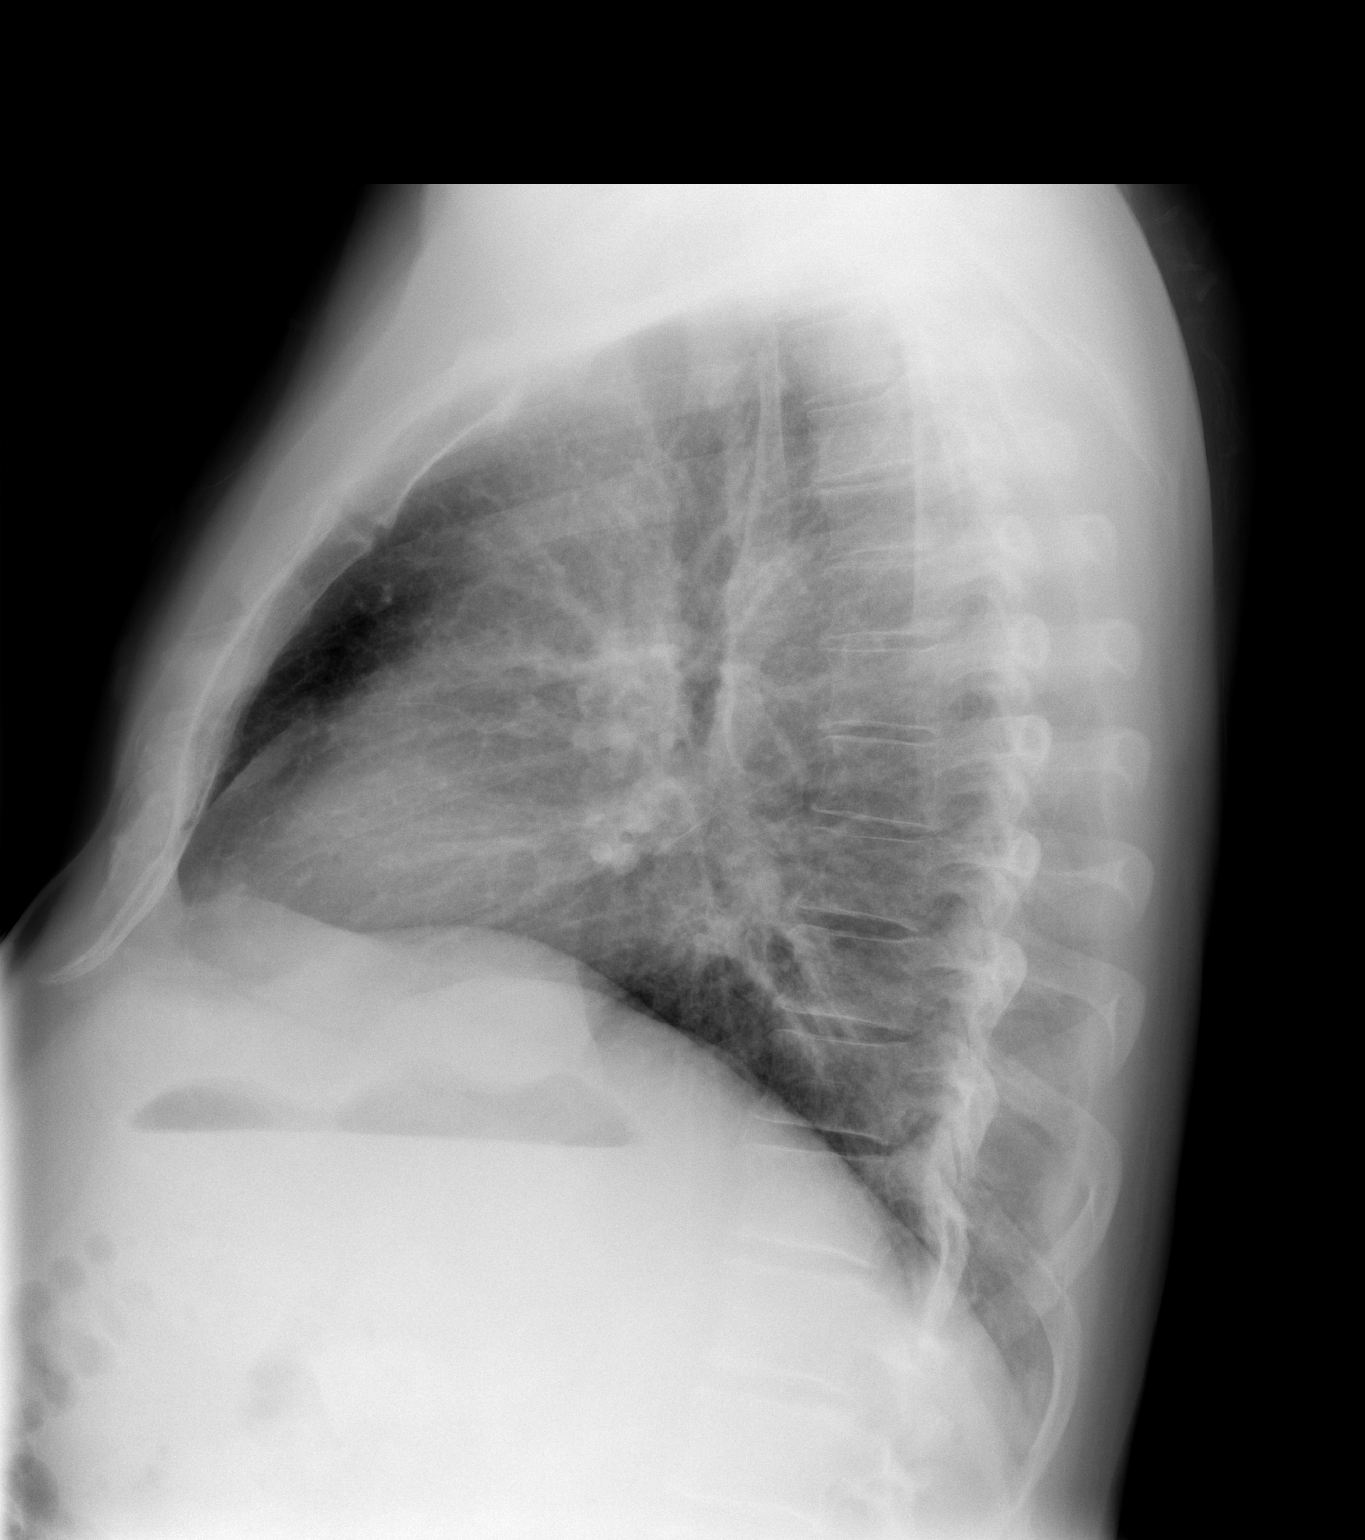

[2 of 2 positions shown; findings below may reference images not displayed]

FINDINGS: The heart size and pulmonary vascularity are normal. The
lungs appear clear and expanded without focal air space disease or
consolidation. No blunting of the costophrenic angles.  No
pneumothorax.  Mediastinal contours appear intact.  Mild
degenerative changes in the spine.  No significant change since
previous study.
IMPRESSION: No evidence of active pulmonary disease.

## 2013-02-13 MED ORDER — SODIUM CHLORIDE 0.9 % IV BOLUS (SEPSIS)
1000.0000 mL | Freq: Once | INTRAVENOUS | Status: AC
  Administered 2013-02-13: 1000 mL via INTRAVENOUS

## 2013-02-13 NOTE — ED Notes (Signed)
Pt reports to the ED via [facility] from [facility]. Pt did not eat and had ordered a beer but did not get to drink it. Bystanders report his eyes rolled back in his head.Pt has hx of dehydration problems in the past where he was orthostatic. Pt was no orthostatic en route but complains of being thirsty. Pt has hx of DM. CBG obtained en route reveals pt is not hypoglycemic. Pt denies any CP, SOB, and is A&Ox 4 at this time. Pt denies any lightheadedness or dizziness at this time but does report some shaking.

## 2013-02-13 NOTE — ED Provider Notes (Signed)
History     CSN: [id number]  Arrival date & time 02/13/13  2009   First MD Initiated Contact with Patient 02/13/13 2116      Chief Complaint  Patient presents with  . Near Syncope    (Consider location/radiation/quality/duration/timing/severity/associated sxs/prior treatment) The history is provided by the patient.   Patient presents with an episode of near-syncope. He has not eaten much today. He states he feels dehydrated. He states he's had numerous episodes of similar symptoms when he gets dehydrated. He was at the restaurant and ordered the urine his eyes rolled back in her head and he felt like passing out. He feels somewhat better now. His pressures have been low. No chest pain. No abdominal pain. No headache. No confusion. Past Medical History  Diagnosis Date  . Diabetes mellitus without complication   . Hypertension   . Hyperlipemia   . Thyroid disease     Past Surgical History  Procedure Laterality Date  . Cardiac catheterization    . Carpal tunnel release    . Hand tendon surgery      History reviewed. No pertinent family history.  History  Substance Use Topics  . Smoking status: Current Every Day Smoker -- 2.00 packs/day  . Smokeless tobacco: Not on file  . Alcohol Use: 0.6 oz/week    1 Cans of beer per week     Comment: occasionally       Review of Systems  Constitutional: Negative for activity change and appetite change.  HENT: Negative for neck stiffness.   Eyes: Negative for pain.  Respiratory: Negative for chest tightness and shortness of breath.   Cardiovascular: Negative for chest pain and leg swelling.  Gastrointestinal: Negative for nausea, vomiting, abdominal pain and diarrhea.  Genitourinary: Negative for flank pain.  Musculoskeletal: Negative for back pain.  Skin: Negative for rash.  Neurological: Positive for light-headedness. Negative for weakness, numbness and headaches.  Psychiatric/Behavioral: Negative for behavioral problems.     Allergies  Review of patient's allergies indicates no known allergies.  Home Medications   Current Outpatient Rx  Name  Route  Sig  Dispense  Refill  . aspirin EC 81 MG tablet   Oral   Take 81 mg by mouth daily.         Marland Kitchen atorvastatin (LIPITOR) 20 MG tablet   Oral   Take 20 mg by mouth every morning.         Marland Kitchen glimepiride (AMARYL) 4 MG tablet   Oral   Take 4 mg by mouth 2 (two) times daily.         . hydrochlorothiazide (HYDRODIURIL) 25 MG tablet   Oral   Take 25 mg by mouth every morning.         Marland Kitchen levothyroxine (SYNTHROID, LEVOTHROID) 50 MCG tablet   Oral   Take 50 mcg by mouth every morning.         . metFORMIN (GLUCOPHAGE) 1000 MG tablet   Oral   Take 1,000 mg by mouth 2 (two) times daily with a meal.         . Multiple Vitamin (MULTIVITAMIN WITH MINERALS) TABS   Oral   Take 1 tablet by mouth daily.         Marland Kitchen omega-3 acid ethyl esters (LOVAZA) 1 G capsule   Oral   Take 1 g by mouth 2 (two) times daily.         . sitaGLIPtin (JANUVIA) 100 MG tablet   Oral   Take 100 mg  by mouth daily.         . valsartan (DIOVAN) 160 MG tablet   Oral   Take 160 mg by mouth daily.         . Vitamin D, Ergocalciferol, (DRISDOL) 50000 UNITS CAPS   Oral   Take 50,000 Units by mouth every 7 (seven) days. fridays           BP 136/94  Pulse 83  Temp(Src) 97.7 F (36.5 C) (Oral)  Resp 18  SpO2 98%  Physical Exam  Nursing note and vitals reviewed. Constitutional: He is oriented to person, place, and time. He appears well-developed and well-nourished.  HENT:  Head: Normocephalic and atraumatic.  Eyes: EOM are normal. Pupils are equal, round, and reactive to light.  Neck: Normal range of motion. Neck supple.  Cardiovascular: Normal rate, regular rhythm and normal heart sounds.   No murmur heard. Pulmonary/Chest: Effort normal and breath sounds normal.  Abdominal: Soft. Bowel sounds are normal. He exhibits no distension and no mass. There is no  tenderness. There is no rebound and no guarding.  Musculoskeletal: Normal range of motion. He exhibits no edema.  Neurological: He is alert and oriented to person, place, and time. No cranial nerve deficit.  Skin: Skin is warm and dry.  Psychiatric: He has a normal mood and affect.    ED Course  Procedures (including critical care time)  Labs Reviewed  GLUCOSE, CAPILLARY - Abnormal; Notable for the following:    Glucose-Capillary 167 (*)    All other components within normal limits  CBC WITH DIFFERENTIAL - Abnormal; Notable for the following:    WBC 17.0 (*)    HCT 38.8 (*)    Neutrophils Relative 83 (*)    Neutro Abs 14.0 (*)    Lymphocytes Relative 10 (*)    Monocytes Absolute 1.2 (*)    All other components within normal limits  POCT I-STAT, CHEM 8 - Abnormal; Notable for the following:    Glucose, Bld 159 (*)    All other components within normal limits  POCT I-STAT TROPONIN I   Dg Chest 2 View  02/13/2013  *RADIOLOGY REPORT*  Clinical Data: Syncope  CHEST - 2 VIEW  Comparison: 08/20/2009  Findings: The heart size and pulmonary vascularity are normal. The lungs appear clear and expanded without focal air space disease or consolidation. No blunting of the costophrenic angles.  No pneumothorax.  Mediastinal contours appear intact.  Mild degenerative changes in the spine.  No significant change since previous study.  IMPRESSION: No evidence of active pulmonary disease.   Original Report Authenticated By: Burman Nieves, M.D.      1. Near syncope      Date: 02/14/2013  Rate: 82  Rhythm: normal sinus rhythm  QRS Axis: normal  Intervals: normal  ST/T Wave abnormalities: nonspecific ST/T changes  Conduction Disutrbances:left bundle branch block  Narrative Interpretation: Unable to see old EKG, however report from previous heart cath states that he has a known left bundle-branch block.  Old EKG Reviewed: unchanged    MDM  Patient with near syncope. Previous episodes and  dehydration. Patient feels much better after treatment. EKG showed an old left bundle branch block. He had a clean cath 4 years ago. He will be discharged home        Juliet Rude. Rubin Payor, MD 02/14/13 [place] [facility]

## 2013-02-14 ENCOUNTER — Ambulatory Visit (INDEPENDENT_AMBULATORY_CARE_PROVIDER_SITE_OTHER): Payer: BC Managed Care – PPO | Admitting: Ophthalmology

## 2013-03-07 ENCOUNTER — Ambulatory Visit (INDEPENDENT_AMBULATORY_CARE_PROVIDER_SITE_OTHER): Payer: Self-pay | Admitting: Ophthalmology

## 2013-04-04 ENCOUNTER — Ambulatory Visit (INDEPENDENT_AMBULATORY_CARE_PROVIDER_SITE_OTHER): Payer: Self-pay | Admitting: Ophthalmology

## 2013-07-18 ENCOUNTER — Encounter: Payer: Self-pay | Admitting: Gastroenterology

## 2014-06-15 DIAGNOSIS — Z79899 Other long term (current) drug therapy: Secondary | ICD-10-CM | POA: Insufficient documentation

## 2015-01-01 DIAGNOSIS — E039 Hypothyroidism, unspecified: Secondary | ICD-10-CM | POA: Diagnosis present

## 2015-01-01 DIAGNOSIS — E119 Type 2 diabetes mellitus without complications: Secondary | ICD-10-CM

## 2017-01-15 DIAGNOSIS — Z800 Family history of malignant neoplasm of digestive organs: Secondary | ICD-10-CM | POA: Insufficient documentation

## 2017-01-16 ENCOUNTER — Encounter: Payer: Self-pay | Admitting: Gastroenterology

## 2017-03-02 ENCOUNTER — Ambulatory Visit: Payer: Self-pay | Admitting: Gastroenterology

## 2017-03-02 ENCOUNTER — Telehealth: Payer: Self-pay

## 2017-03-02 NOTE — Telephone Encounter (Signed)
No, don't charge him. Did she not want to r/s?

## 2017-03-08 NOTE — Telephone Encounter (Signed)
Bryce Adams will call back to reschedule later.

## 2017-03-17 ENCOUNTER — Ambulatory Visit (INDEPENDENT_AMBULATORY_CARE_PROVIDER_SITE_OTHER): Payer: Self-pay | Admitting: Family Medicine

## 2017-03-17 VITALS — BP 138/68 | HR 87 | Temp 98.3°F | Resp 16 | Ht 69.0 in | Wt 194.0 lb

## 2017-03-17 DIAGNOSIS — Z024 Encounter for examination for driving license: Secondary | ICD-10-CM

## 2017-03-17 NOTE — Progress Notes (Signed)
Subjective:  By signing my name below, I, Bryce Adams, attest that this documentation has been prepared under the direction and in the presence of Bryce Ray, MD. Electronically Signed: Moises Adams, [place]. 03/17/2017 , 2:06 PM .  Patient was seen in Room 10 .   Patient ID: Bryce Adams, male    DOB: Jun 23, 1951, 66 y.o.   MRN: [id number] Chief Complaint  Patient presents with  . Employment Physical    dot   HPI Bryce Adams is a 66 y.o. male Here for DOT physical. He has a history of hyperlipidemia, HTN, DM and thyroid disease. He denies any chronic pain. He requires glasses to drive.   DM He is followed by Dr. Brynda Greathouse, last visit on Feb 19th. His most recent A1C was on Feb 2nd, measured at 5.9, improved from 11.3 back in Nov 2017. He informs that he's lost about 25 lbs. He's taking oral medications and injectable Victoza; he denies being on insulin. He denies any hypoglycemia. He denies history of heart disease.   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 He denies pain or loss of feeling in his feet.   HTN He takes oral medications without complications. He denies missing any doses of his medication. He informs his BP usually doesn't run this high. He denies history of sleep apnea, daytime somnolence, or snoring. He is a smoker.   Hyperlipidemia He takes lipitor 20mg  QD without complications.   There are no active problems to display for this patient.  Past Medical History:  Diagnosis Date  . Diabetes mellitus without complication ([employer])   . Hyperlipemia   . Hypertension   . Thyroid disease    Past Surgical History:  Procedure Laterality Date  . CARDIAC CATHETERIZATION    . CARPAL TUNNEL RELEASE    . HAND TENDON SURGERY     No Known Allergies Prior to Admission medications   Medication Sig Start Date End Date Taking? Authorizing Provider  aspirin EC 81 MG tablet Take 81 mg by mouth daily.   Yes Historical Provider, MD  atorvastatin  (LIPITOR) 20 MG tablet Take 20 mg by mouth every morning.   Yes Historical Provider, MD  gabapentin (NEURONTIN) 300 MG capsule TK 1 C PO BID 01/15/17  Yes Historical Provider, MD  glimepiride (AMARYL) 4 MG tablet Take 4 mg by mouth 2 (two) times daily.   Yes Historical Provider, MD  hydrochlorothiazide (HYDRODIURIL) 25 MG tablet Take 25 mg by mouth every morning.   Yes Historical Provider, MD  levothyroxine (SYNTHROID, LEVOTHROID) 50 MCG tablet Take 50 mcg by mouth every morning.   Yes Historical Provider, MD  metFORMIN (GLUCOPHAGE) 1000 MG tablet Take 1,000 mg by mouth 2 (two) times daily with a meal.   Yes Historical Provider, MD  Multiple Vitamin (MULTIVITAMIN WITH MINERALS) TABS Take 1 tablet by mouth daily.   Yes Historical Provider, MD  omega-3 acid ethyl esters (LOVAZA) 1 G capsule Take 1 g by mouth 2 (two) times daily.   Yes Historical Provider, MD  sitaGLIPtin (JANUVIA) 100 MG tablet Take 100 mg by mouth daily.   Yes Historical Provider, MD  valsartan (DIOVAN) 160 MG tablet Take 160 mg by mouth daily.   Yes Historical Provider, MD  VICTOZA 18 MG/3ML SOPN  02/27/17  Yes Historical Provider, MD  Vitamin D, Ergocalciferol, (DRISDOL) 50000 UNITS CAPS Take 50,000 Units by mouth every 7 (seven) days. fridays   Yes Historical Provider, MD   Social History   Social  History  . Marital status: Married    Spouse name: N/A  . Number of children: N/A  . Years of education: N/A   Occupational History  . Not on file.   Social History Main Topics  . Smoking status: Current Every Day Smoker    Packs/day: 2.00  . Smokeless tobacco: Never Used  . Alcohol use 0.6 oz/week    1 Cans of beer per week     Comment: occasionally   . Drug use: No  . Sexual activity: Not on file   Other Topics Concern  . Not on file   Social History Narrative  . No narrative on file   Review of Systems  All other systems reviewed and are negative.      Objective:   Physical Exam  Constitutional: He is  oriented to person, place, and time. He appears well-developed and well-nourished.  HENT:  Head: Normocephalic and atraumatic.  Right Ear: External ear normal.  Left Ear: External ear normal.  Mouth/Throat: Oropharynx is clear and moist.  Eyes: Conjunctivae and EOM are normal. Pupils are equal, round, and reactive to light.  Neck: Normal range of motion. Neck supple. No thyromegaly present.  Cardiovascular: Normal rate, regular rhythm, normal heart sounds and intact distal pulses.   Pulmonary/Chest: Effort normal and breath sounds normal. No respiratory distress. He has no wheezes.  Abdominal: Soft. He exhibits no distension. There is no tenderness. Hernia confirmed negative in the right inguinal area and confirmed negative in the left inguinal area.  Musculoskeletal: Normal range of motion. He exhibits no edema or tenderness.  Lymphadenopathy:    He has no cervical adenopathy.  Neurological: He is alert and oriented to person, place, and time. He has normal reflexes.  Skin: Skin is warm and dry.  Psychiatric: He has a normal mood and affect. His behavior is normal.  Vitals reviewed.   Vitals:   03/17/17 1340 03/17/17 1407  BP: (!) 148/76 138/68  Pulse: 87   Resp: 16   Temp: 98.3 F (36.8 C)   TempSrc: Oral   SpO2: 98%   Weight: 194 lb (88 kg)   Height: 5\' 9"  (1.753 m)     Visual Acuity Screening   Right eye Left eye Both eyes  Without correction:     With correction: 20/15 20/13 -2 20/15 -1  Comments: Titmus 85% Colors 6/6  Hearing Screening Comments: Whisper 9 ft      Assessment & Plan:    [name] is a 66 y.o. male Encounter for commercial driver medical examination (CDME)  -History of diabetes with diabetic neuropathy, recent A1c control. Sensation and strength intact at lower extremities, no concerning findings on exam. One year card provided, see DOT paperwork   Patient Instructions    One year card given for DOT with history of high Adams pressure and  diabetes. Follow-up with your primary care provider as planned.     IF you received an x-Adams today, you will receive an invoice from [facility]. Please contact [facility] at [phone] with questions or concerns regarding your invoice.   IF you received labwork today, you will receive an invoice from [place]. Please contact [facility] at [phone] with questions or concerns regarding your invoice.   Our billing staff will not be able to assist you with questions regarding bills from these companies.  You will be contacted with the lab results as soon as they are available. The fastest way to get your results is to activate your My  Chart account. Instructions are located on the last page of this paperwork. If you have not heard from [place] regarding the results in 2 weeks, please contact this office.      I personally performed the services described in this documentation, which was scribed in my presence. The recorded information has been reviewed and considered for accuracy and completeness, addended by me as needed, and agree with information above.  Signed,   Bryce Ray, MD [facility] at [place].  03/18/17 10:35 PM

## 2017-03-17 NOTE — Patient Instructions (Addendum)
  One year card given for DOT with history of high blood pressure and diabetes. Follow-up with your primary care provider as planned.     IF you received an x-ray today, you will receive an invoice from [facility]. Please contact [facility] at [phone] with questions or concerns regarding your invoice.   IF you received labwork today, you will receive an invoice from [place]. Please contact [facility] at [phone] with questions or concerns regarding your invoice.   Our billing staff will not be able to assist you with questions regarding bills from these companies.  You will be contacted with the lab results as soon as they are available. The fastest way to get your results is to activate your My Chart account. Instructions are located on the last page of this paperwork. If you have not heard from [place] regarding the results in 2 weeks, please contact this office.

## 2018-02-13 ENCOUNTER — Encounter: Payer: Self-pay | Admitting: Physician Assistant

## 2018-02-15 ENCOUNTER — Encounter: Payer: Self-pay | Admitting: Physician Assistant

## 2018-02-28 ENCOUNTER — Other Ambulatory Visit: Payer: Self-pay

## 2018-02-28 ENCOUNTER — Encounter: Payer: Self-pay | Admitting: Family Medicine

## 2018-02-28 ENCOUNTER — Ambulatory Visit: Payer: Self-pay | Admitting: Family Medicine

## 2018-02-28 VITALS — BP 120/70 | HR 88 | Temp 97.6°F | Ht 69.0 in | Wt 198.0 lb

## 2018-02-28 DIAGNOSIS — Z024 Encounter for examination for driving license: Secondary | ICD-10-CM

## 2018-02-28 NOTE — Progress Notes (Signed)
By signing my name below, I, Theresia Bough, attest that this documentation has been prepared under the direction and in the presence of Carlota Raspberry Ranell Patrick, MD.  Electronically Signed: Theresia Bough, Medical Scribe 02/28/18 at 4:46 PM  Subjective:    Patient ID: Bryce Adams, male    DOB: November 09, 1951, 67 y.o.   MRN: [id number] Chief Complaint  Patient presents with  . Employment Physical    DOT    HPI Bryce Adams is a 67 y.o. male who presents to Primary Care at [facility] for his DOT physical. He has a Hx of DM, HTN, HLD and Hypothyroidism. His last DOT physical was by me on 03/17/17.   DM A1c 6.9 in Oct 2018. He states he does not have problems with his medications or low blood sugar. He has intermittent pain in his feet that he takes Gabapentin for. He denies weakness, numbness or tingling in his feet.   HTN BP is controlled today on medication. He denies CP with activity, exertion or exercise. He denies any hx of CAD and is unsure if he has had a stress test.   HLD He takes Lipitor 20 mg daily and Omega 3 supplement.   Hypothyroidism  He takes Synthroid 50 mcg daily. He had a normal TSH Oct 2018.  Tobacco use He smokes 0.5-0.75 packs per day. He denies wheezing or cough syncope.   Cancer Screening Colon CA: He had an Endoscopy with Dr. Sharlett Iles in 2009 that was normal. He is due in Dec 2019  Vision  Visual Acuity Screening   Right eye Left eye Both eyes  Without correction:     With correction: 20/20 20/20 20/20   Comments: Peripheral Vision: Right eye 85 degrees. Left eye 85 degrees.  The patient can  distinguish the colors red, amber and green.  Hearing Screening Comments: The patient was able to hear a forced whisper from 10 feet.   There are no active problems to display for this patient.  Past Medical History:  Diagnosis Date  . Diabetes mellitus without complication ([place])   . Hyperlipemia   . Hypertension   . Thyroid disease    Past Surgical History:    Procedure Laterality Date  . CARDIAC CATHETERIZATION    . CARPAL TUNNEL RELEASE    . HAND TENDON SURGERY     No Known Allergies Prior to Admission medications   Medication Sig Start Date End Date Taking? Authorizing Provider  aspirin EC 81 MG tablet Take 81 mg by mouth daily.    [provider]  atorvastatin (LIPITOR) 20 MG tablet Take 20 mg by mouth every morning.    [provider]  gabapentin (NEURONTIN) 300 MG capsule TK 1 C PO BID 01/15/17   [provider]  glimepiride (AMARYL) 4 MG tablet Take 4 mg by mouth 2 (two) times daily.    [provider]  hydrochlorothiazide (HYDRODIURIL) 25 MG tablet Take 25 mg by mouth every morning.    [provider]  levothyroxine (SYNTHROID, LEVOTHROID) 50 MCG tablet Take 50 mcg by mouth every morning.    [provider]  metFORMIN (GLUCOPHAGE) 1000 MG tablet Take 1,000 mg by mouth 2 (two) times daily with a meal.    [provider]  Multiple Vitamin (MULTIVITAMIN WITH MINERALS) TABS Take 1 tablet by mouth daily.    [provider]  omega-3 acid ethyl esters (LOVAZA) 1 G capsule Take 1 g by mouth 2 (two) times daily.    [provider]  sitaGLIPtin Celesta Gentile)  100 MG tablet Take 100 mg by mouth daily.    [provider]  valsartan (DIOVAN) 160 MG tablet Take 160 mg by mouth daily.    [provider]  VICTOZA 18 MG/3ML SOPN  02/27/17   [provider]  Vitamin D, Ergocalciferol, (DRISDOL) 50000 UNITS CAPS Take 50,000 Units by mouth every 7 (seven) days. fridays    [provider]   Social History   Socioeconomic History  . Marital status: Married    Spouse name: Not on file  . Number of children: Not on file  . Years of education: Not on file  . Highest education level: Not on file  Occupational History  . Not on file  Social Needs  . Financial resource strain: Not on file  . Food insecurity:    Worry: Not on file    Inability: Not  on file  . Transportation needs:    Medical: Not on file    Non-medical: Not on file  Tobacco Use  . Smoking status: Current Every Day Smoker    Packs/day: 2.00  . Smokeless tobacco: Never Used  Substance and Sexual Activity  . Alcohol use: Yes    Alcohol/week: 0.6 oz    Types: 1 Cans of beer per week    Comment: occasionally   . Drug use: No  . Sexual activity: Not on file  Lifestyle  . Physical activity:    Days per week: Not on file    Minutes per session: Not on file  . Stress: Not on file  Relationships  . Social connections:    Talks on phone: Not on file    Gets together: Not on file    Attends religious service: Not on file    Active member of club or organization: Not on file    Attends meetings of clubs or organizations: Not on file    Relationship status: Not on file  . Intimate partner violence:    Fear of current or ex partner: Not on file    Emotionally abused: Not on file    Physically abused: Not on file    Forced sexual activity: Not on file  Other Topics Concern  . Not on file  Social History Narrative  . Not on file    Review of Systems  ROS by driver survey.     Objective:   Physical Exam  Constitutional: He is oriented to person, place, and time. He appears well-developed and well-nourished.  HENT:  Head: Normocephalic and atraumatic.  Right Ear: External ear normal.  Left Ear: External ear normal.  Mouth/Throat: Oropharynx is clear and moist.  Eyes: Pupils are equal, round, and reactive to light. Conjunctivae and EOM are normal.  Neck: Normal range of motion. Neck supple. No thyromegaly present.  Cardiovascular: Normal rate, regular rhythm, normal heart sounds and intact distal pulses.  Pulmonary/Chest: Effort normal and breath sounds normal. No respiratory distress. He has no wheezes.  Abdominal: Soft. He exhibits no distension. There is no tenderness. Hernia confirmed negative in the right inguinal area and confirmed negative in the left  inguinal area.  Musculoskeletal: Normal range of motion. He exhibits no edema or tenderness.  Strength intact feet bilaterally  Lymphadenopathy:    He has no cervical adenopathy.  Neurological: He is alert and oriented to person, place, and time. He has normal reflexes.  Romberg negative. NVI distally  Skin: Skin is warm and dry.  Psychiatric: He has a normal mood and affect. His behavior is  normal.  Vitals reviewed. sensation and strength intact into hands and feet bilaterally.   Vitals:   02/28/18 1558  BP: 120/70  Pulse: 88  Temp: 97.6 F (36.4 C)  TempSrc: Oral  SpO2: 97%  Weight: 198 lb (89.8 kg)  Height: 5\' 9"  (1.753 m)       Assessment & Plan:  Bryce Adams is a 67 y.o. 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  Overall fair control on previous testing.  Denies side effects of medications, hypoglycemia.  Sensation and strength intact into extremities.  Taking gabapentin for pain in the past, tolerating without any side effects.  No concerns on exam or history, one year card given with history of diabetes - see paperwork.  Proteinuria noted on urinalysis, plan to follow-up with primary care provider.  No orders of the defined types were placed in this encounter.  Patient Instructions    1 year dot card. Keep follow up with primary care provider. Protein noted on urine test today - that can be checked with primary provider. Thanks for coming in today.    IF you received an x-ray today, you will receive an invoice from [facility]. Please contact [facility] at [phone] with questions or concerns regarding your invoice.   IF you received labwork today, you will receive an invoice from [place]. Please contact [facility] at [phone] with questions or concerns regarding your invoice.   Our billing staff will not be able to assist you with questions regarding bills from these  companies.  You will be contacted with the lab results as soon as they are available. The fastest way to get your results is to activate your My Chart account. Instructions are located on the last page of this paperwork. If you have not heard from [place] regarding the results in 2 weeks, please contact this office.       I personally performed the services described in this documentation, which was scribed in my presence. The recorded information has been reviewed and considered for accuracy and completeness, addended by me as needed, and agree with information above.  Signed,   [name], MD Primary [facility] at [place].  03/02/18 10:43 PM

## 2018-02-28 NOTE — Patient Instructions (Addendum)
  1 year dot card. Keep follow up with primary care provider. Protein noted on urine test today - that can be checked with primary provider. Thanks for coming in today.    IF you received an x-ray today, you will receive an invoice from [facility]. Please contact [facility] at [phone] with questions or concerns regarding your invoice.   IF you received labwork today, you will receive an invoice from [place]. Please contact [facility] at [phone] with questions or concerns regarding your invoice.   Our billing staff will not be able to assist you with questions regarding bills from these companies.  You will be contacted with the lab results as soon as they are available. The fastest way to get your results is to activate your My Chart account. Instructions are located on the last page of this paperwork. If you have not heard from [place] regarding the results in 2 weeks, please contact this office.

## 2018-11-17 ENCOUNTER — Encounter: Payer: Self-pay | Admitting: Gastroenterology

## 2019-03-27 DIAGNOSIS — M25561 Pain in right knee: Secondary | ICD-10-CM | POA: Insufficient documentation

## 2019-04-18 DIAGNOSIS — N1831 Chronic kidney disease, stage 3a: Secondary | ICD-10-CM | POA: Insufficient documentation

## 2019-08-19 ENCOUNTER — Encounter: Payer: Self-pay | Admitting: Gastroenterology

## 2019-10-03 ENCOUNTER — Ambulatory Visit (AMBULATORY_SURGERY_CENTER): Payer: Self-pay

## 2019-10-03 ENCOUNTER — Encounter: Payer: Self-pay | Admitting: Gastroenterology

## 2019-10-03 ENCOUNTER — Other Ambulatory Visit: Payer: Self-pay

## 2019-10-03 VITALS — Ht 69.0 in | Wt 184.0 lb

## 2019-10-03 DIAGNOSIS — Z1211 Encounter for screening for malignant neoplasm of colon: Secondary | ICD-10-CM

## 2019-10-03 DIAGNOSIS — Z800 Family history of malignant neoplasm of digestive organs: Secondary | ICD-10-CM

## 2019-10-03 MED ORDER — NA SULFATE-K SULFATE-MG SULF 17.5-3.13-1.6 GM/177ML PO SOLN: 1.0000 | Freq: Once | ORAL | 0 refills | Status: AC

## 2019-10-03 NOTE — Progress Notes (Signed)
Denies allergies to eggs or soy products. Denies complication of anesthesia or sedation. Denies use of weight loss medication. Denies use of O2.   Emmi instructions given for colonoscopy.  Pre-Visit was conducted by phone due to the patients work schedule. Instructions were reviewed and mailed to patients confirmed home address. Patient was encouraged to call if he had any questions regarding instructions.   Covid screening is scheduled for Wednesday 10/15/19 @ 3:35 Pm.

## 2019-10-13 ENCOUNTER — Telehealth: Payer: Self-pay | Admitting: Gastroenterology

## 2019-10-14 NOTE — Telephone Encounter (Signed)
Spoke with pt- he states he never received his prep instructions and his procedure is on Friday, 10-17-19.  He has picked up his Suprep.  Reviewed instructions over the phone and instructed him regarding his diabetic medicine.  Understanding voiced

## 2019-10-15 ENCOUNTER — Ambulatory Visit (INDEPENDENT_AMBULATORY_CARE_PROVIDER_SITE_OTHER): Payer: BC Managed Care – PPO

## 2019-10-15 ENCOUNTER — Other Ambulatory Visit: Payer: Self-pay | Admitting: Gastroenterology

## 2019-10-15 DIAGNOSIS — Z1159 Encounter for screening for other viral diseases: Secondary | ICD-10-CM

## 2019-10-16 LAB — SARS CORONAVIRUS 2 (TAT 6-24 HRS): SARS Coronavirus 2: NEGATIVE

## 2019-10-17 ENCOUNTER — Encounter: Payer: Self-pay | Admitting: Gastroenterology

## 2019-10-17 ENCOUNTER — Other Ambulatory Visit: Payer: Self-pay

## 2019-10-17 ENCOUNTER — Ambulatory Visit (AMBULATORY_SURGERY_CENTER): Payer: BC Managed Care – PPO | Admitting: Gastroenterology

## 2019-10-17 VITALS — BP 117/75 | HR 70 | Temp 97.9°F | Resp 17 | Ht 69.0 in | Wt 184.0 lb

## 2019-10-17 DIAGNOSIS — D123 Benign neoplasm of transverse colon: Secondary | ICD-10-CM

## 2019-10-17 DIAGNOSIS — D127 Benign neoplasm of rectosigmoid junction: Secondary | ICD-10-CM | POA: Diagnosis not present

## 2019-10-17 DIAGNOSIS — Z1211 Encounter for screening for malignant neoplasm of colon: Secondary | ICD-10-CM | POA: Diagnosis present

## 2019-10-17 DIAGNOSIS — D122 Benign neoplasm of ascending colon: Secondary | ICD-10-CM

## 2019-10-17 MED ORDER — SODIUM CHLORIDE 0.9 % IV SOLN: 500.0000 mL | INTRAVENOUS | Status: DC

## 2019-10-17 NOTE — Progress Notes (Signed)
Temp JB  v/s cw

## 2019-10-17 NOTE — Op Note (Addendum)
[facility] Patient Name: Bryce Adams Procedure Date: 10/17/2019 9:45 AM MRN: [id number] Endoscopist: Remo Lipps P. Havery Moros , MD Age: 68 Referring MD:  Date of Birth: Jun 12, 1951 Gender: Male Account #: [id number] Procedure:                Colonoscopy Indications:              Screening for colorectal malignant neoplasm Medicines:                Monitored Anesthesia Care Procedure:                Pre-Anesthesia Assessment:                           - Prior to the procedure, a History and Physical                            was performed, and patient medications and                            allergies were reviewed. The patient's tolerance of                            previous anesthesia was also reviewed. The risks                            and benefits of the procedure and the sedation                            options and risks were discussed with the patient.                            All questions were answered, and informed consent                            was obtained. Prior Anticoagulants: The patient has                            taken no previous anticoagulant or antiplatelet                            agents. ASA Grade Assessment: II - A patient with                            mild systemic disease. After reviewing the risks                            and benefits, the patient was deemed in                            satisfactory condition to undergo the procedure.                           After obtaining informed consent, the colonoscope  was passed under direct vision. Throughout the                            procedure, the patient's blood pressure, pulse, and                            oxygen saturations were monitored continuously. The                            Colonoscope was introduced through the anus and                            advanced to the the cecum, identified by                            appendiceal orifice and  ileocecal valve. The                            colonoscopy was performed without difficulty. The                            patient tolerated the procedure well. The quality                            of the bowel preparation was adequate. The                            ileocecal valve, appendiceal orifice, and rectum                            were photographed. Scope In: [id number] Scope Out: 10:13:29 AM Scope Withdrawal Time: 0 hours 23 minutes 23 seconds  Total Procedure Duration: 0 hours 26 minutes 30 seconds  Findings:                 The perianal and digital rectal examinations were                            normal.                           A 4 mm polyp was found in the ascending colon. The                            polyp was sessile. The polyp was removed with a                            cold snare. Resection and retrieval were complete.                           Two sessile polyps were found in the transverse                            colon. The polyps were 3 to 4 mm in size. These  polyps were removed with a cold snare. Resection                            and retrieval were complete.                           Three sessile polyps were found in the                            recto-sigmoid colon. The polyps were 3 to 4 mm in                            size. These polyps were removed with a cold snare.                            Resection and retrieval were complete.                           A few small-mouthed diverticula were found in the                            sigmoid colon.                           Internal hemorrhoids were found during retroflexion.                           The exam was otherwise without abnormality. Complications:            No immediate complications. Estimated blood loss:                            Minimal. Estimated Blood Loss:     Estimated blood loss was minimal. Impression:               - One 4 mm polyp in the  ascending colon, removed                            with a cold snare. Resected and retrieved.                           - Two 3 to 4 mm polyps in the transverse colon,                            removed with a cold snare. Resected and retrieved.                           - Three 3 to 4 mm polyps at the recto-sigmoid                            colon, removed with a cold snare. Resected and                            retrieved.                           -  Diverticulosis in the sigmoid colon.                           - Internal hemorrhoids.                           - The examination was otherwise normal. Recommendation:           - Patient has a contact number available for                            emergencies. The signs and symptoms of potential                            delayed complications were discussed with the                            patient. Return to normal activities tomorrow.                            Written discharge instructions were provided to the                            patient.                           - Resume previous diet.                           - Continue present medications.                           - Await pathology results. Remo Lipps P. Armbruster, MD 10/17/2019 10:17:26 AM This report has been signed electronically.

## 2019-10-17 NOTE — Patient Instructions (Signed)
HANDOUTS INCLUDE: POLYPS, DIVERTICULOSIS AND HEMORRHOIDS    YOU HAD AN ENDOSCOPIC PROCEDURE TODAY AT [place]:   Refer to the procedure report that was given to you for any specific questions about what was found during the examination.  If the procedure report does not answer your questions, please call your gastroenterologist to clarify.  If you requested that your care partner not be given the details of your procedure findings, then the procedure report has been included in a sealed envelope for you to review at your convenience later.  YOU SHOULD EXPECT: Some feelings of bloating in the abdomen. Passage of more gas than usual.  Walking can help get rid of the air that was put into your GI tract during the procedure and reduce the bloating. If you had a lower endoscopy (such as a colonoscopy or flexible sigmoidoscopy) you may notice spotting of blood in your stool or on the toilet paper. If you underwent a bowel prep for your procedure, you may not have a normal bowel movement for a few days.  Please Note:  You might notice some irritation and congestion in your nose or some drainage.  This is from the oxygen used during your procedure.  There is no need for concern and it should clear up in a day or so.  SYMPTOMS TO REPORT IMMEDIATELY:   Following lower endoscopy (colonoscopy or flexible sigmoidoscopy):  Excessive amounts of blood in the stool  Significant tenderness or worsening of abdominal pains  Swelling of the abdomen that is new, acute  Fever of 100F or higher   For urgent or emergent issues, a gastroenterologist can be reached at any hour by calling [phone].   DIET:  We do recommend a small meal at first, but then you may proceed to your regular diet.  Drink plenty of fluids but you should avoid alcoholic beverages for 24 hours.  ACTIVITY:  You should plan to take it easy for the rest of today and you should NOT DRIVE or use heavy machinery until  tomorrow (because of the sedation medicines used during the test).    FOLLOW UP: Our staff will call the number listed on your records 48-72 hours following your procedure to check on you and address any questions or concerns that you may have regarding the information given to you following your procedure. If we do not reach you, we will leave a message.  We will attempt to reach you two times.  During this call, we will ask if you have developed any symptoms of COVID 19. If you develop any symptoms (ie: fever, flu-like symptoms, shortness of breath, cough etc.) before then, please call [phone].  If you test positive for Covid 19 in the 2 weeks post procedure, please call and report this information to [place].    If any biopsies were taken you will be contacted by phone or by letter within the next 1-3 weeks.  Please call us at [phone] if you have not heard about the biopsies in 3 weeks.    SIGNATURES/CONFIDENTIALITY: You and/or your care partner have signed paperwork which will be entered into your electronic medical record.  These signatures attest to the fact that that the information above on your After Visit Summary has been reviewed and is understood.  Full responsibility of the confidentiality of this discharge information lies with you and/or your care-partner.

## 2019-10-17 NOTE — Progress Notes (Signed)
Called to room to assist during endoscopic procedure.  Patient ID and intended procedure confirmed with present staff. Received instructions for my participation in the procedure from the performing physician.  

## 2019-10-17 NOTE — Progress Notes (Signed)
PT taken to PACU. Monitors in place. VSS. Report given to RN. 

## 2019-10-20 DIAGNOSIS — D123 Benign neoplasm of transverse colon: Secondary | ICD-10-CM | POA: Insufficient documentation

## 2019-10-20 DIAGNOSIS — K5730 Diverticulosis of large intestine without perforation or abscess without bleeding: Secondary | ICD-10-CM | POA: Insufficient documentation

## 2019-10-21 ENCOUNTER — Telehealth: Payer: Self-pay

## 2019-10-21 NOTE — Telephone Encounter (Signed)
  Follow up Call-  Call back number 10/17/2019  Post procedure Call Back phone  # [phone]  Permission to leave phone message Yes  Some recent data might be hidden     Patient questions:  Do you have a fever, pain , or abdominal swelling? No. Pain Score  0 *  Have you tolerated food without any problems? Yes.    Have you been able to return to your normal activities? Yes.    Do you have any questions about your discharge instructions: Diet   No. Medications  No. Follow up visit  No.  Do you have questions or concerns about your Care? No.  Actions: * If pain score is 4 or above: No action needed, pain <4.  1. Have you developed a fever since your procedure? no  2.   Have you had an respiratory symptoms (SOB or cough) since your procedure? no  3.   Have you tested positive for COVID 19 since your procedure no  4.   Have you had any family members/close contacts diagnosed with the COVID 19 since your procedure?  no   If yes to any of these questions please route to Joylene John, RN and Alphonsa Gin, Therapist, sports.

## 2019-10-25 ENCOUNTER — Encounter: Payer: Self-pay | Admitting: Gastroenterology

## 2021-09-09 DIAGNOSIS — D539 Nutritional anemia, unspecified: Secondary | ICD-10-CM | POA: Insufficient documentation

## 2021-09-09 DIAGNOSIS — R790 Abnormal level of blood mineral: Secondary | ICD-10-CM | POA: Insufficient documentation

## 2021-09-12 ENCOUNTER — Observation Stay (HOSPITAL_COMMUNITY)
Admission: EM | Admit: 2021-09-12 | Discharge: 2021-09-14 | Disposition: A | Discharge status: Home / Self Care | Payer: No Typology Code available for payment source | Attending: Internal Medicine | Admitting: Internal Medicine

## 2021-09-12 ENCOUNTER — Emergency Department (HOSPITAL_COMMUNITY): Payer: No Typology Code available for payment source

## 2021-09-12 ENCOUNTER — Other Ambulatory Visit: Payer: Self-pay

## 2021-09-12 ENCOUNTER — Encounter (HOSPITAL_COMMUNITY): Payer: Self-pay | Admitting: *Deleted

## 2021-09-12 DIAGNOSIS — Z23 Encounter for immunization: Secondary | ICD-10-CM | POA: Diagnosis not present

## 2021-09-12 DIAGNOSIS — Z7982 Long term (current) use of aspirin: Secondary | ICD-10-CM | POA: Diagnosis not present

## 2021-09-12 DIAGNOSIS — E119 Type 2 diabetes mellitus without complications: Secondary | ICD-10-CM | POA: Diagnosis not present

## 2021-09-12 DIAGNOSIS — E039 Hypothyroidism, unspecified: Secondary | ICD-10-CM | POA: Diagnosis not present

## 2021-09-12 DIAGNOSIS — I6350 Cerebral infarction due to unspecified occlusion or stenosis of unspecified cerebral artery: Secondary | ICD-10-CM | POA: Diagnosis present

## 2021-09-12 DIAGNOSIS — Z20822 Contact with and (suspected) exposure to covid-19: Secondary | ICD-10-CM | POA: Diagnosis not present

## 2021-09-12 DIAGNOSIS — E782 Mixed hyperlipidemia: Secondary | ICD-10-CM | POA: Insufficient documentation

## 2021-09-12 DIAGNOSIS — J342 Deviated nasal septum: Secondary | ICD-10-CM | POA: Insufficient documentation

## 2021-09-12 DIAGNOSIS — I639 Cerebral infarction, unspecified: Principal | ICD-10-CM

## 2021-09-12 DIAGNOSIS — I10 Essential (primary) hypertension: Secondary | ICD-10-CM | POA: Diagnosis not present

## 2021-09-12 DIAGNOSIS — F17210 Nicotine dependence, cigarettes, uncomplicated: Secondary | ICD-10-CM | POA: Diagnosis not present

## 2021-09-12 DIAGNOSIS — Z79899 Other long term (current) drug therapy: Secondary | ICD-10-CM | POA: Diagnosis not present

## 2021-09-12 DIAGNOSIS — F17200 Nicotine dependence, unspecified, uncomplicated: Secondary | ICD-10-CM | POA: Diagnosis present

## 2021-09-12 DIAGNOSIS — Z7984 Long term (current) use of oral hypoglycemic drugs: Secondary | ICD-10-CM | POA: Insufficient documentation

## 2021-09-12 DIAGNOSIS — R531 Weakness: Secondary | ICD-10-CM | POA: Diagnosis present

## 2021-09-12 HISTORY — DX: Disorder of kidney and ureter, unspecified: N28.9

## 2021-09-12 LAB — DIFFERENTIAL
Abs Immature Granulocytes: 0.15 10*3/uL — ABNORMAL HIGH (ref 0.00–0.07)
Basophils Absolute: 0.1 10*3/uL (ref 0.0–0.1)
Basophils Relative: 1 %
Eosinophils Absolute: 0.3 10*3/uL (ref 0.0–0.5)
Eosinophils Relative: 3 %
Immature Granulocytes: 1 %
Lymphocytes Relative: 16 %
Lymphs Abs: 1.8 10*3/uL (ref 0.7–4.0)
Monocytes Absolute: 0.9 10*3/uL (ref 0.1–1.0)
Monocytes Relative: 9 %
Neutro Abs: 7.7 10*3/uL (ref 1.7–7.7)
Neutrophils Relative %: 70 %

## 2021-09-12 LAB — URINALYSIS, ROUTINE W REFLEX MICROSCOPIC
Bacteria, UA: NONE SEEN
Bilirubin Urine: NEGATIVE
Glucose, UA: >=500 mg/dL — AB
Hgb urine dipstick: NEGATIVE
Ketones, ur: 5 mg/dL — AB
Leukocytes,Ua: NEGATIVE
Nitrite: NEGATIVE
Protein, ur: 100 mg/dL — AB
Specific Gravity, Urine: 1.018 (ref 1.005–1.030)
pH: 7 (ref 5.0–8.0)

## 2021-09-12 LAB — CBC
HCT: 39.8 % (ref 39.0–52.0)
Hemoglobin: 13.8 g/dL (ref 13.0–17.0)
MCH: 32.7 pg (ref 26.0–34.0)
MCHC: 34.7 g/dL (ref 30.0–36.0)
MCV: 94.3 fL (ref 80.0–100.0)
Platelets: 246 10*3/uL (ref 150–400)
RBC: 4.22 MIL/uL (ref 4.22–5.81)
RDW: 12.6 % (ref 11.5–15.5)
WBC: 11 10*3/uL — ABNORMAL HIGH (ref 4.0–10.5)
nRBC: 0 % (ref 0.0–0.2)

## 2021-09-12 LAB — RESP PANEL BY RT-PCR (FLU A&B, COVID) ARPGX2
Influenza A by PCR: NEGATIVE
Influenza B by PCR: NEGATIVE
SARS Coronavirus 2 by RT PCR: NEGATIVE

## 2021-09-12 LAB — COMPREHENSIVE METABOLIC PANEL
ALT: 44 U/L (ref 0–44)
AST: 34 U/L (ref 15–41)
Albumin: 3.9 g/dL (ref 3.5–5.0)
Alkaline Phosphatase: 88 U/L (ref 38–126)
Anion gap: 13 (ref 5–15)
BUN: 33 mg/dL — ABNORMAL HIGH (ref 8–23)
CO2: 20 mmol/L — ABNORMAL LOW (ref 22–32)
Calcium: 9.2 mg/dL (ref 8.9–10.3)
Chloride: 100 mmol/L (ref 98–111)
Creatinine, Ser: 1.46 mg/dL — ABNORMAL HIGH (ref 0.61–1.24)
GFR, Estimated: 52 mL/min — ABNORMAL LOW (ref >60)
Glucose, Bld: 186 mg/dL — ABNORMAL HIGH (ref 70–99)
Potassium: 4.8 mmol/L (ref 3.5–5.1)
Sodium: 133 mmol/L — ABNORMAL LOW (ref 135–145)
Total Bilirubin: 0.2 mg/dL — ABNORMAL LOW (ref 0.3–1.2)
Total Protein: 7.1 g/dL (ref 6.5–8.1)

## 2021-09-12 LAB — CBG MONITORING, ED: Glucose-Capillary: 186 mg/dL — ABNORMAL HIGH (ref 70–99)

## 2021-09-12 LAB — PROTIME-INR
INR: 0.9 (ref 0.8–1.2)
Prothrombin Time: 12.5 seconds (ref 11.4–15.2)

## 2021-09-12 LAB — APTT: aPTT: 27 seconds (ref 24–36)

## 2021-09-12 MED ORDER — ATORVASTATIN CALCIUM 20 MG PO TABS
20.0000 mg | ORAL_TABLET | Freq: Every morning | ORAL | Status: DC
  Administered 2021-09-13 – 2021-09-14 (×2): 20 mg via ORAL
  Filled 2021-09-12 (×2): qty 1
  Filled 2021-09-12: qty 2

## 2021-09-12 MED ORDER — LEVOTHYROXINE SODIUM 50 MCG PO TABS: 50.0000 ug | ORAL_TABLET | Freq: Every morning | ORAL | Status: DC

## 2021-09-12 MED ORDER — LEVOTHYROXINE SODIUM 50 MCG PO TABS
50.0000 ug | ORAL_TABLET | Freq: Every morning | ORAL | Status: DC
  Administered 2021-09-13 – 2021-09-14 (×2): 50 ug via ORAL
  Filled 2021-09-12 (×2): qty 1

## 2021-09-12 MED ORDER — ACETAMINOPHEN 160 MG/5ML PO SOLN: 650.0000 mg | ORAL | Status: DC | PRN

## 2021-09-12 MED ORDER — ACETAMINOPHEN 650 MG RE SUPP: 650.0000 mg | RECTAL | Status: DC | PRN

## 2021-09-12 MED ORDER — IRBESARTAN 150 MG PO TABS: 300.0000 mg | ORAL_TABLET | Freq: Every day | ORAL | Status: DC

## 2021-09-12 MED ORDER — FAMOTIDINE 20 MG PO TABS
20.0000 mg | ORAL_TABLET | Freq: Every day | ORAL | Status: DC
  Administered 2021-09-14: 20 mg via ORAL
  Filled 2021-09-12 (×3): qty 1

## 2021-09-12 MED ORDER — LORAZEPAM 2 MG/ML IJ SOLN
1.0000 mg | Freq: Once | INTRAMUSCULAR | Status: DC
  Filled 2021-09-12: qty 1

## 2021-09-12 MED ORDER — HYDROCHLOROTHIAZIDE 25 MG PO TABS: 25.0000 mg | ORAL_TABLET | Freq: Every morning | ORAL | Status: DC

## 2021-09-12 MED ORDER — ACETAMINOPHEN 325 MG PO TABS: 650.0000 mg | ORAL_TABLET | ORAL | Status: DC | PRN

## 2021-09-12 MED ORDER — HEPARIN SODIUM (PORCINE) 5000 UNIT/ML IJ SOLN
5000.0000 [IU] | Freq: Three times a day (TID) | INTRAMUSCULAR | Status: DC
  Administered 2021-09-12 – 2021-09-14 (×5): 5000 [IU] via SUBCUTANEOUS
  Filled 2021-09-12 (×5): qty 1

## 2021-09-12 MED ORDER — ASPIRIN EC 81 MG PO TBEC
81.0000 mg | DELAYED_RELEASE_TABLET | Freq: Every day | ORAL | Status: DC
  Administered 2021-09-13 – 2021-09-14 (×2): 81 mg via ORAL
  Filled 2021-09-12 (×3): qty 1

## 2021-09-12 MED ORDER — SODIUM CHLORIDE 0.9 % IV BOLUS
1000.0000 mL | Freq: Once | INTRAVENOUS | Status: AC
  Administered 2021-09-12: 1000 mL via INTRAVENOUS

## 2021-09-12 MED ORDER — ASPIRIN EC 81 MG PO TBEC: 81.0000 mg | DELAYED_RELEASE_TABLET | Freq: Every day | ORAL | Status: DC

## 2021-09-12 MED ORDER — INSULIN ASPART 100 UNIT/ML IJ SOLN
0.0000 [IU] | Freq: Three times a day (TID) | INTRAMUSCULAR | Status: DC
  Administered 2021-09-13: 5 [IU] via SUBCUTANEOUS
  Administered 2021-09-13: 15 [IU] via SUBCUTANEOUS
  Administered 2021-09-13 – 2021-09-14 (×2): 8 [IU] via SUBCUTANEOUS

## 2021-09-12 MED ORDER — INSULIN ASPART 100 UNIT/ML IJ SOLN
0.0000 [IU] | Freq: Every day | INTRAMUSCULAR | Status: DC
  Administered 2021-09-13: 2 [IU] via SUBCUTANEOUS

## 2021-09-12 MED ORDER — STROKE: EARLY STAGES OF RECOVERY BOOK
Freq: Once | Status: AC
  Filled 2021-09-12: qty 1

## 2021-09-12 NOTE — ED Triage Notes (Signed)
Weakness in left side onset 09/05/21 worse the pat 3 days. Recently started taking Jardience. Advised to come in by  PCP

## 2021-09-12 NOTE — ED Provider Notes (Signed)
[facility] EMERGENCY DEPARTMENT Provider Note   CSN: [id number] Arrival date & time: 09/12/21  1554     History Chief Complaint  Patient presents with   Extremity Weakness    Bryce Adams is a 70 y.o. male.  HPI    70 year old male comes in with chief complaint of weakness.  Patient has history of diabetes, hypertension, hyperlipidemia and smokes about a pack and a half a day.  Patient states that last week he started having some left-sided weakness.  The weakness was primarily in his upper and lower extremities.  Although the weakness is improved, he still not back to baseline.  Patient subsequently saw his PCP.  An MRI has been ordered and patient was started on Jardiance and Keflex for diabetes and UTI.  Today, the wife reports that patient had an episode of slurred speech and his face appeared droopy.  Past Medical History:  Diagnosis Date   Diabetes mellitus without complication ([place])    Hyperlipemia    Hypertension    Renal disorder    Thyroid disease     There are no problems to display for this patient.   Past Surgical History:  Procedure Laterality Date   CARDIAC CATHETERIZATION     CARPAL TUNNEL RELEASE     HAND TENDON SURGERY         Family History  Problem Relation Age of Onset   Colon polyps Mother    Colon cancer Mother    Esophageal cancer Neg Hx    Rectal cancer Neg Hx    Stomach cancer Neg Hx     Social History   Tobacco Use   Smoking status: Every Day    Packs/day: 2.00    Types: Cigarettes   Smokeless tobacco: Never  Substance Use Topics   Alcohol use: Yes    Alcohol/week: 1.0 standard drink    Types: 1 Cans of beer per week    Comment: occasionally    Drug use: No    Home Medications Prior to Admission medications   Medication Sig Start Date End Date Taking? Authorizing Provider  aspirin EC 81 MG tablet Take 81 mg by mouth daily.    [provider]  atorvastatin (LIPITOR) 20 MG tablet Take 20 mg by mouth every  morning.    [provider]  gabapentin (NEURONTIN) 300 MG capsule TK 1 C PO BID 01/15/17   [provider]  hydrochlorothiazide (HYDRODIURIL) 25 MG tablet Take 25 mg by mouth every morning.    [provider]  levothyroxine (SYNTHROID, LEVOTHROID) 50 MCG tablet Take 50 mcg by mouth every morning.    [provider]  metFORMIN (GLUCOPHAGE) 1000 MG tablet Take 1,000 mg by mouth 2 (two) times daily with a meal.    [provider]  Multiple Vitamin (MULTIVITAMIN WITH MINERALS) TABS Take 1 tablet by mouth daily.    [provider]  omega-3 acid ethyl esters (LOVAZA) 1 G capsule Take 1 g by mouth 2 (two) times daily.    [provider]  sitaGLIPtin (JANUVIA) 100 MG tablet Take 100 mg by mouth daily.    [provider]  valsartan (DIOVAN) 160 MG tablet Take 160 mg by mouth daily.    [provider]  VICTOZA 18 MG/3ML SOPN  02/27/17   [provider]  Vitamin D, Ergocalciferol, (DRISDOL) 50000 UNITS CAPS Take 50,000 Units by mouth every 7 (seven) days. fridays    [provider]    Allergies    Patient  has no known allergies.  Review of Systems   Review of Systems  Constitutional:  Positive for activity change.  Eyes:  Negative for visual disturbance.  Respiratory:  Negative for shortness of breath.   Cardiovascular:  Negative for chest pain.  Neurological:  Positive for speech difficulty and weakness.  All other systems reviewed and are negative.  Physical Exam Updated Vital Signs BP (!) 148/79 (BP Location: Right Arm)   Pulse 85   Temp 98.6 F (37 C) (Oral)   Resp 19   Ht 5\' 9"  (1.753 m)   Wt 87.1 kg   SpO2 94%   BMI 28.35 kg/m   Physical Exam Vitals reviewed.  Constitutional:      Appearance: He is well-developed.  HENT:     Head: Normocephalic and atraumatic.  Eyes:     Pupils: Pupils are equal, round, and reactive to light.  Neck:     Vascular: No JVD.  Cardiovascular:      Rate and Rhythm: Normal rate and regular rhythm.  Pulmonary:     Effort: Pulmonary effort is normal.     Breath sounds: Normal breath sounds.  Abdominal:     General: Bowel sounds are normal.     Palpations: Abdomen is soft.     Tenderness: There is no abdominal tenderness.  Musculoskeletal:     Cervical back: Normal range of motion and neck supple.  Skin:    General: Skin is warm and dry.  Neurological:     Mental Status: He is alert and oriented to person, place, and time.     Cranial Nerves: No cranial nerve deficit.     Sensory: No sensory deficit.     Motor: Weakness present.     Coordination: Coordination normal.     Comments: Left upper extremity has a drift, left lower extremity has 4 out of 5 strength compared to right side which is 4+ out of 5.    ED Results / Procedures / Treatments   Labs (all labs ordered are listed, but only abnormal results are displayed) Labs Reviewed  CBC - Abnormal; Notable for the following components:      Result Value   WBC 11.0 (*)    All other components within normal limits  DIFFERENTIAL - Abnormal; Notable for the following components:   Abs Immature Granulocytes 0.15 (*)    All other components within normal limits  RESP PANEL BY RT-PCR (FLU A&B, COVID) ARPGX2  PROTIME-INR  APTT  COMPREHENSIVE METABOLIC PANEL  URINALYSIS, ROUTINE W REFLEX MICROSCOPIC    EKG EKG Interpretation  Date/Time:  Monday September 12 2021 18:16:51 EDT Ventricular Rate:  81 PR Interval:  163 QRS Duration: 147 QT Interval:  422 QTC Calculation: 490 R Axis:   56 Text Interpretation: Sinus rhythm IVCD, consider atypical LBBB No acute changes TWI in lateral lead is not new No significant change since last tracing Confirmed by Varney Biles [phone]) on 09/12/2021 6:59:45 PM  Radiology No results found.  Procedures Procedures   Medications Ordered in ED Medications - No data to display  ED Course  I have reviewed the triage vital signs and the  nursing notes.  Pertinent labs & imaging results that were available during my care of the patient were reviewed by me and considered in my medical decision making (see chart for details).    MDM Rules/Calculators/A&P  70 year old comes in with chief complaint of left-sided weakness that has been present for a week. Today he had an episode of slurred speech and facial droop.  Clinically, it appears that he likely had a subacute stroke.  Unsure what to make of the waxing and waning symptoms.  Possibly an embolic stroke?  Vasospasm?  I think will be best to admit the patient to the hospital for neuro has been consulted.  Final Clinical Impression(s) / ED Diagnoses Final diagnoses:  Acute ischemic stroke [facility])    Rx / DC Orders ED Discharge Orders     None        Varney Biles, MD 09/12/21 1901

## 2021-09-12 NOTE — ED Notes (Signed)
Pt transported to CT ?

## 2021-09-12 NOTE — H&P (Signed)
TRH H&P    Patient Demographics:    Bryce Adams, is a 70 y.o. male  MRN: [id number]  DOB - 1951-10-21  Admit Date - 09/12/2021  Referring MD/NP/PA: Kathrynn Humble  Outpatient Primary MD for the patient is Eber Hong, MD  Patient coming from: Home  Chief complaint- left sided weakness   HPI:    Bryce Adams  is a 70 y.o. male, with history of DMII, HLD, HTN, Thyroid disease, tobacco use disorder, renal disorder and more presents to the ED with a chief complaint of left sided weakness.  Patient is quite irritable during my exam.  Wife reports that that is due to him nicotine cravings as he is a very heavy smoker. Answer when asked, he smokes.  I offered a nicotine patch to him but advised that I would have to know how many packs a day he smokes to calculate the correct amount of nicotine.  Patient abruptly declined with some involvement of profanity as well.  He refused to give any history.  He told me to take a history from his wife.  He did report that he is not in any pain right now  Wife reports that patient has had left-sided weakness for about a week.  They went to the PCP and got blood work done.  He had hypomagnesemia, and then was found to have CKD, CHF, UTI according to the wife.  He was started on Jardiance and antibiotic as well as magnesium supplement.  3 days ago his weakness started worsening.  Wife thought it might be due to the new medication so she held them, but it did not give any relief.  Patient complained of near syncope today, slurred speech, and wife thinks she saw left facial droop as well.  That started between 1130 and 12 PM, just before lunch.  They called PCP office who advised him to come into the ED.  Patient reports he has nothing to add to this history.  He still will not say, she smokes.  Patient threatening to leave AMA 5 continue asking questions, so the medical interview portion of the exam  was abruptly cut short.  In the ED Temp 98.6, heart rate 76-85, respiratory rate 18-27, blood pressure 149/83, satting at 97% Leukocytosis 11, hemoglobin stable 13.8 Chemistry panel shows a BUN of 33, creatinine 1.46 CT head showed no acute intracranial abnormality EKG showed a heart rate of 81, sinus rhythm, QTC 490 Patient was kept n.p.o., UA is pending, normal saline 1 L bolus given Neurology was consulted because there was a change in his symptoms today compared over to the last week.  They advised bring him in for neuro consult, and stroke work-up.  Review of systems:    Review of systems could not be obtained secondary to patient's refusal to participate in questioning    Past History of the following :    Past Medical History:  Diagnosis Date   Diabetes mellitus without complication ([place])    Hyperlipemia    Hypertension    Renal disorder  Thyroid disease       Past Surgical History:  Procedure Laterality Date   CARDIAC CATHETERIZATION     CARPAL TUNNEL RELEASE     HAND TENDON SURGERY        Social History:      Social History   Tobacco Use   Smoking status: Every Day    Packs/day: 2.00    Types: Cigarettes   Smokeless tobacco: Never  Substance Use Topics   Alcohol use: Yes    Alcohol/week: 1.0 standard drink    Types: 1 Cans of beer per week    Comment: occasionally        Family History :     Family History  Problem Relation Age of Onset   Colon polyps Mother    Colon cancer Mother    Esophageal cancer Neg Hx    Rectal cancer Neg Hx    Stomach cancer Neg Hx       Home Medications:   Prior to Admission medications   Medication Sig Start Date End Date Taking? Authorizing Provider  aspirin EC 81 MG tablet Take 81 mg by mouth daily.   Yes [provider]  atorvastatin (LIPITOR) 20 MG tablet Take 20 mg by mouth every morning.   Yes [provider]  cefdinir (OMNICEF) 300 MG capsule Take 300 mg by mouth 2 (two) times  daily. 09/09/21  Yes [provider]  famotidine (PEPCID) 20 MG tablet Take 20 mg by mouth daily.   Yes [provider]  hydrochlorothiazide (HYDRODIURIL) 25 MG tablet Take 25 mg by mouth every morning.   Yes [provider]  irbesartan (AVAPRO) 300 MG tablet Take 1 tablet by mouth daily. 02/11/21  Yes [provider]  levothyroxine (SYNTHROID, LEVOTHROID) 50 MCG tablet Take 50 mcg by mouth every morning.   Yes [provider]  Magnesium Oxide 400 MG CAPS Take 1 capsule by mouth 2 (two) times daily. 09/09/21  Yes [provider]  metFORMIN (GLUCOPHAGE) 1000 MG tablet Take 1,000 mg by mouth 2 (two) times daily with a meal.   Yes [provider]  Multiple Vitamin (MULTIVITAMIN WITH MINERALS) TABS Take 1 tablet by mouth daily.   Yes [provider]  omega-3 acid ethyl esters (LOVAZA) 1 G capsule Take 2 g by mouth 2 (two) times daily.   Yes [provider]  sitaGLIPtin (JANUVIA) 100 MG tablet Take 100 mg by mouth daily.   Yes [provider]  valsartan (DIOVAN) 160 MG tablet Take 160 mg by mouth daily.   Yes [provider]  VICTOZA 18 MG/3ML SOPN Inject 1.8 mg into the skin every evening. 02/27/17  Yes [provider]  empagliflozin (JARDIANCE) 10 MG TABS tablet Take by mouth. 09/09/21   [provider]  gabapentin (NEURONTIN) 300 MG capsule TK 1 C PO BID Patient not taking: No sig reported 01/15/17   [provider]  Vitamin D, Ergocalciferol, (DRISDOL) 50000 UNITS CAPS Take 50,000 Units by mouth every 7 (seven) days. fridays Patient not taking: No sig reported    [provider]     Allergies:    No Known Allergies   Physical Exam:   Vitals  Blood pressure (!) 164/95, pulse 77, temperature 98.6 F (37 C), temperature source Oral, resp. rate 16, height 5\' 9"  (1.753 m), weight 87.1 kg, SpO2 95 %.  1.  General: Patient lying supine in bed,  no acute distress    2. Psychiatric: Alert and oriented x 3,  mood and behavior normal for situation, pleasant and cooperative with exam   3. Neurologic: Speech and language are normal, face is symmetric, moves all 4 extremities voluntarily, no pronator drift, patient does get up on his own to sit on the side of the bed, at baseline without acute deficits on limited exam   4. HEENMT:  Head is atraumatic, normocephalic, pupils reactive to light, neck is supple, trachea is midline, mucous membranes are moist   5. Respiratory : Lungs are clear to auscultation bilaterally without wheezing, rhonchi, rales, no cyanosis, no increase in work of breathing or accessory muscle use   6. Cardiovascular : Heart rate normal, rhythm is regular, no murmurs, rubs or gallops, no peripheral edema, peripheral pulses palpated   7. Gastrointestinal:  Abdomen is soft, nondistended, nontender to palpation bowel sounds active, no masses or organomegaly palpated   8. Skin:  Skin is warm, dry and intact without rashes, acute lesions, or ulcers on limited exam   9.Musculoskeletal:  No acute deformities or trauma, no asymmetry in tone, no peripheral edema, peripheral pulses palpated, no tenderness to palpation in the extremities     Data Review:    CBC Recent Labs  Lab 09/12/21 1751  WBC 11.0*  HGB 13.8  HCT 39.8  PLT 246  MCV 94.3  MCH 32.7  MCHC 34.7  RDW 12.6  LYMPHSABS 1.8  MONOABS 0.9  EOSABS 0.3  BASOSABS 0.1   ------------------------------------------------------------------------------------------------------------------  Results for orders placed or performed during the hospital encounter of 09/12/21 (from the past 48 hour(s))  Protime-INR     Status: None   Collection Time: 09/12/21  5:51 PM  Result Value Ref Range   Prothrombin Time 12.5 11.4 - 15.2 seconds   INR 0.9 0.8 - 1.2    Comment: (NOTE) INR goal varies based on device and disease states. Performed at [facility], [address]  [place], [place] [address]   APTT     Status: None   Collection Time: 09/12/21  5:51 PM  Result Value Ref Range   aPTT 27 24 - 36 seconds    Comment: Performed at [facility], [address], [place], [place] [address]  CBC     Status: Abnormal   Collection Time: 09/12/21  5:51 PM  Result Value Ref Range   WBC 11.0 (H) 4.0 - 10.5 K/uL   RBC 4.22 4.22 - 5.81 MIL/uL   Hemoglobin 13.8 13.0 - 17.0 g/dL   HCT 39.8 39.0 - 52.0 %   MCV 94.3 80.0 - 100.0 fL   MCH 32.7 26.0 - 34.0 pg   MCHC 34.7 30.0 - 36.0 g/dL   RDW 12.6 11.5 - 15.5 %   Platelets 246 150 - 400 K/uL   nRBC 0.0 0.0 - 0.2 %    Comment: Performed at [facility], [address] [place], [place] [address]  Differential     Status: Abnormal   Collection Time: 09/12/21  5:51 PM  Result Value Ref Range   Neutrophils Relative % 70 %   Neutro Abs 7.7 1.7 - 7.7 K/uL   Lymphocytes Relative 16 %   Lymphs Abs 1.8 0.7 - 4.0 K/uL   Monocytes Relative 9 %   Monocytes Absolute 0.9 0.1 - 1.0 K/uL   Eosinophils Relative 3 %   Eosinophils Absolute 0.3 0.0 - 0.5 K/uL   Basophils Relative 1 %   Basophils Absolute 0.1 0.0 - 0.1 K/uL   Immature Granulocytes 1 %   Abs Immature Granulocytes 0.15 (H)  0.00 - 0.07 K/uL    Comment: Performed at [facility], [address] [place], [place] [address]  Comprehensive metabolic panel     Status: Abnormal   Collection Time: 09/12/21  5:51 PM  Result Value Ref Range   Sodium 133 (L) 135 - 145 mmol/L   Potassium 4.8 3.5 - 5.1 mmol/L   Chloride 100 98 - 111 mmol/L   CO2 20 (L) 22 - 32 mmol/L   Glucose, Bld 186 (H) 70 - 99 mg/dL    Comment: Glucose reference range applies only to samples taken after fasting for at least 8 hours.   BUN 33 (H) 8 - 23 mg/dL   Creatinine, Ser 1.46 (H) 0.61 - 1.24 mg/dL   Calcium 9.2 8.9 - 10.3 mg/dL   Total Protein 7.1 6.5 - 8.1 g/dL   Albumin 3.9 3.5 - 5.0 g/dL   AST 34 15 - 41 U/L   ALT 44 0 - 44 U/L   Alkaline Phosphatase 88 38 - 126 U/L   Total Bilirubin 0.2 (L)  0.3 - 1.2 mg/dL   GFR, Estimated 52 (L) >60 mL/min    Comment: (NOTE) Calculated using the CKD-EPI Creatinine Equation (2021)    Anion gap 13 5 - 15    Comment: Performed at [facility], [address], [place], [place] [address]  Resp Panel by RT-PCR (Flu A&B, Covid) Nasopharyngeal Swab     Status: None   Collection Time: 09/12/21  7:37 PM   Specimen: Nasopharyngeal Swab; Nasopharyngeal(NP) swabs in vial transport medium  Result Value Ref Range   SARS Coronavirus 2 by RT PCR NEGATIVE NEGATIVE    Comment: (NOTE) SARS-CoV-2 target nucleic acids are NOT DETECTED.  The SARS-CoV-2 RNA is generally detectable in upper respiratory specimens during the acute phase of infection. The lowest concentration of SARS-CoV-2 viral copies this assay can detect is 138 copies/mL. A negative result does not preclude SARS-Cov-2 infection and should not be used as the sole basis for treatment or other patient management decisions. A negative result may occur with  improper specimen collection/handling, submission of specimen other than nasopharyngeal swab, presence of viral mutation(s) within the areas targeted by this assay, and inadequate number of viral copies(<138 copies/mL). A negative result must be combined with clinical observations, patient history, and epidemiological information. The expected result is Negative.  Fact Sheet for Patients:  EntrepreneurPulse.com.au  Fact Sheet for Healthcare Providers:  IncredibleEmployment.be  This test is no t yet approved or cleared by the [place] FDA and  has been authorized for detection and/or diagnosis of SARS-CoV-2 by FDA under an Emergency Use Authorization (EUA). This EUA will remain  in effect (meaning this test can be used) for the duration of the COVID-19 declaration under Section 564(b)(1) of the Act, 21 U.S.C.section 360bbb-3(b)(1), unless the authorization is terminated  or revoked sooner.        Influenza A by PCR NEGATIVE NEGATIVE   Influenza B by PCR NEGATIVE NEGATIVE    Comment: (NOTE) The Xpert Xpress SARS-CoV-2/FLU/RSV plus assay is intended as an aid in the diagnosis of influenza from Nasopharyngeal swab specimens and should not be used as a sole basis for treatment. Nasal washings and aspirates are unacceptable for Xpert Xpress SARS-CoV-2/FLU/RSV testing.  Fact Sheet for Patients: EntrepreneurPulse.com.au  Fact Sheet for Healthcare Providers: IncredibleEmployment.be  This test is not yet approved or cleared by the [place] FDA and has been authorized for detection and/or diagnosis of SARS-CoV-2 by FDA under an Emergency Use Authorization (EUA).  This EUA will remain in effect (meaning this test can be used) for the duration of the COVID-19 declaration under Section 564(b)(1) of the Act, 21 U.S.C. section 360bbb-3(b)(1), unless the authorization is terminated or revoked.  Performed at [facility], [address], [place], [place] [address]   Urinalysis, Routine w reflex microscopic Urine, Clean Catch     Status: Abnormal   Collection Time: 09/12/21  8:41 PM  Result Value Ref Range   Color, Urine STRAW (A) YELLOW   APPearance CLEAR CLEAR   Specific Gravity, Urine 1.018 1.005 - 1.030   pH 7.0 5.0 - 8.0   Glucose, UA >=500 (A) NEGATIVE mg/dL   Hgb urine dipstick NEGATIVE NEGATIVE   Bilirubin Urine NEGATIVE NEGATIVE   Ketones, ur 5 (A) NEGATIVE mg/dL   Protein, ur 100 (A) NEGATIVE mg/dL   Nitrite NEGATIVE NEGATIVE   Leukocytes,Ua NEGATIVE NEGATIVE   RBC / HPF 0-5 0 - 5 RBC/hpf   WBC, UA 0-5 0 - 5 WBC/hpf   Bacteria, UA NONE SEEN NONE SEEN    Comment: Performed at [facility], [address], [place], [place] [address]  CBG monitoring, ED     Status: Abnormal   Collection Time: 09/12/21 10:18 PM  Result Value Ref Range   Glucose-Capillary 186 (H) 70 - 99 mg/dL    Comment: Glucose reference range applies only to samples  taken after fasting for at least 8 hours.    Chemistries  Recent Labs  Lab 09/12/21 1751  NA 133*  K 4.8  CL 100  CO2 20*  GLUCOSE 186*  BUN 33*  CREATININE 1.46*  CALCIUM 9.2  AST 34  ALT 44  ALKPHOS 88  BILITOT 0.2*   ------------------------------------------------------------------------------------------------------------------  ------------------------------------------------------------------------------------------------------------------ GFR: Estimated Creatinine Clearance: 52.9 mL/min (A) (by C-G formula based on SCr of 1.46 mg/dL (H)). Liver Function Tests: Recent Labs  Lab 09/12/21 1751  AST 34  ALT 44  ALKPHOS 88  BILITOT 0.2*  PROT 7.1  ALBUMIN 3.9   No results for input(s): LIPASE, AMYLASE in the last 168 hours. No results for input(s): AMMONIA in the last 168 hours. Coagulation Profile: Recent Labs  Lab 09/12/21 1751  INR 0.9   Cardiac Enzymes: No results for input(s): CKTOTAL, CKMB, CKMBINDEX, TROPONINI in the last 168 hours. BNP (last 3 results) No results for input(s): PROBNP in the last 8760 hours. HbA1C: No results for input(s): HGBA1C in the last 72 hours. CBG: Recent Labs  Lab 09/12/21 2218  GLUCAP 186*   Lipid Profile: No results for input(s): CHOL, HDL, LDLCALC, TRIG, CHOLHDL, LDLDIRECT in the last 72 hours. Thyroid Function Tests: No results for input(s): TSH, T4TOTAL, FREET4, T3FREE, THYROIDAB in the last 72 hours. Anemia Panel: No results for input(s): VITAMINB12, FOLATE, FERRITIN, TIBC, IRON, RETICCTPCT in the last 72 hours.  --------------------------------------------------------------------------------------------------------------- Urine analysis:    Component Value Date/Time   COLORURINE STRAW (A) 09/12/2021 2041   APPEARANCEUR CLEAR 09/12/2021 2041   LABSPEC 1.018 09/12/2021 2041   PHURINE 7.0 09/12/2021 2041   GLUCOSEU >=500 (A) 09/12/2021 2041   HGBUR NEGATIVE 09/12/2021 2041   BILIRUBINUR NEGATIVE  09/12/2021 2041   KETONESUR 5 (A) 09/12/2021 2041   PROTEINUR 100 (A) 09/12/2021 2041   UROBILINOGEN 0.2 12/01/2008 1100   NITRITE NEGATIVE 09/12/2021 2041   LEUKOCYTESUR NEGATIVE 09/12/2021 2041      Imaging Results:    CT HEAD WO CONTRAST  Result Date: 09/12/2021 CLINICAL DATA:  Neuro deficit, acute, stroke suspected EXAM: CT HEAD WITHOUT CONTRAST TECHNIQUE: Contiguous  axial images were obtained from the base of the skull through the vertex without intravenous contrast. COMPARISON:  None. FINDINGS: Brain: No evidence of acute intracranial hemorrhage or extra-axial collection.No evidence of mass lesion/concern mass effect.The ventricles are normal in size. Vascular: No hyperdense vessel or unexpected calcification. Skull: Normal. Negative for fracture or focal lesion. 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 Sinuses/Orbits: No acute finding. Other: None. IMPRESSION: No acute intracranial abnormality. Electronically Signed   By: Maurine Simmering M.D.   On: 09/12/2021 18:37       Assessment & Plan:    Active Problems:   Stroke [facility])   Acquired hypothyroidism   DMII (diabetes mellitus, type 2) (HCC)   Tobacco use disorder   CVA MRI brain in the AM Echo in the AM US carotids in the AM PT/OT/ST Continue statin and aspirin AKI Creatinine increased from 0.8-1.46 The patient has not been feeling well for a week, reasonable to assume this could be due to poor p.o. intake Hold ARB and hydrochlorothiazide Continue to encourage p.o. fluid intake Trend in a.m. DMII Hgb A1C pending Sliding scale coverage Tobacco use disorder Refuses nicotine patch Hypothyroidism Continue Synthroid HTN Continue ARB, HCTZ No permissive HTN - given duration of symptoms   DVT Prophylaxis-   Heparin- SCDs   AM Labs Ordered, also please review Full Orders  Family Communication: Admission, patients condition and plan of care including tests being ordered  have been discussed with the patient and wife who indicate understanding and agree with the plan and Code Status.   Admission status: Observation Time spent in minutes : [place]

## 2021-09-12 NOTE — ED Notes (Signed)
Pt placed on cardiac monitor with BP to set cycle every 120 minutes. Continuous pulse oximeter applied.

## 2021-09-13 ENCOUNTER — Encounter (HOSPITAL_COMMUNITY): Payer: Self-pay | Admitting: Family Medicine

## 2021-09-13 ENCOUNTER — Observation Stay (HOSPITAL_BASED_OUTPATIENT_CLINIC_OR_DEPARTMENT_OTHER): Payer: No Typology Code available for payment source

## 2021-09-13 ENCOUNTER — Observation Stay (HOSPITAL_COMMUNITY): Payer: No Typology Code available for payment source

## 2021-09-13 DIAGNOSIS — I6389 Other cerebral infarction: Secondary | ICD-10-CM | POA: Diagnosis not present

## 2021-09-13 DIAGNOSIS — I639 Cerebral infarction, unspecified: Secondary | ICD-10-CM | POA: Diagnosis not present

## 2021-09-13 LAB — CBC
HCT: 38 % — ABNORMAL LOW (ref 39.0–52.0)
Hemoglobin: 13.3 g/dL (ref 13.0–17.0)
MCH: 32.4 pg (ref 26.0–34.0)
MCHC: 35 g/dL (ref 30.0–36.0)
MCV: 92.5 fL (ref 80.0–100.0)
Platelets: 242 10*3/uL (ref 150–400)
RBC: 4.11 MIL/uL — ABNORMAL LOW (ref 4.22–5.81)
RDW: 12.3 % (ref 11.5–15.5)
WBC: 10.4 10*3/uL (ref 4.0–10.5)
nRBC: 0 % (ref 0.0–0.2)

## 2021-09-13 LAB — COMPREHENSIVE METABOLIC PANEL
ALT: 38 U/L (ref 0–44)
AST: 24 U/L (ref 15–41)
Albumin: 3.7 g/dL (ref 3.5–5.0)
Alkaline Phosphatase: 85 U/L (ref 38–126)
Anion gap: 7 (ref 5–15)
BUN: 33 mg/dL — ABNORMAL HIGH (ref 8–23)
CO2: 24 mmol/L (ref 22–32)
Calcium: 9.4 mg/dL (ref 8.9–10.3)
Chloride: 101 mmol/L (ref 98–111)
Creatinine, Ser: 1.05 mg/dL (ref 0.61–1.24)
GFR, Estimated: >60 mL/min (ref >60)
Glucose, Bld: 231 mg/dL — ABNORMAL HIGH (ref 70–99)
Potassium: 4.2 mmol/L (ref 3.5–5.1)
Sodium: 132 mmol/L — ABNORMAL LOW (ref 135–145)
Total Bilirubin: 0.3 mg/dL (ref 0.3–1.2)
Total Protein: 6.6 g/dL (ref 6.5–8.1)

## 2021-09-13 LAB — GLUCOSE, CAPILLARY
Glucose-Capillary: 240 mg/dL — ABNORMAL HIGH (ref 70–99)
Glucose-Capillary: 287 mg/dL — ABNORMAL HIGH (ref 70–99)
Glucose-Capillary: 362 mg/dL — ABNORMAL HIGH (ref 70–99)
Glucose-Capillary: 223 mg/dL — ABNORMAL HIGH (ref 70–99)

## 2021-09-13 LAB — HEMOGLOBIN A1C
Hgb A1c MFr Bld: 9.4 % — ABNORMAL HIGH (ref 4.8–5.6)
Mean Plasma Glucose: 223.08 mg/dL

## 2021-09-13 LAB — ECHOCARDIOGRAM COMPLETE
AR max vel: 1.82 cm2
AV Area VTI: 1.79 cm2
AV Area mean vel: 1.89 cm2
AV Mean grad: 9 mmHg
AV Peak grad: 18 mmHg
Ao pk vel: 2.12 m/s
Area-P 1/2: 2.66 cm2
Height: 69 in
S' Lateral: 2.5 cm
Weight: 3028.8 oz

## 2021-09-13 LAB — LIPID PANEL
Cholesterol: 218 mg/dL — ABNORMAL HIGH (ref 0–200)
Triglycerides: 1349 mg/dL — ABNORMAL HIGH (ref <150)
VLDL: UNDETERMINED mg/dL (ref 0–40)

## 2021-09-13 LAB — HIV ANTIBODY (ROUTINE TESTING W REFLEX): HIV Screen 4th Generation wRfx: NONREACTIVE

## 2021-09-13 IMAGING — MR MR HEAD W/O CM
9 of 10 series · 39 of 48 positions shown · non-contrast
Comparison: CT head without contrast [DATE]

CLINICAL DATA: Near syncope. Slurred speech. Possible left facial
droop. Symptoms began yesterday.

EXAM:
MRI HEAD WITHOUT CONTRAST
MRA HEAD WITHOUT CONTRAST
TECHNIQUE: Multiplanar, multi-echo pulse sequences of the brain and surrounding
structures were acquired without intravenous contrast. Angiographic
images of the Circle of Willis were acquired using MRA technique
without intravenous contrast.

[Series 5: DWI · axial · 4.0mm · 0.88mm/px · z∈[-73,+66]mm · 5 of 36 slices shown (1 of 4)]
[im 1/36]
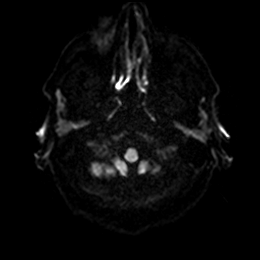
[im 9/36]
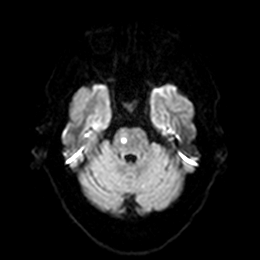
[im 18/36]
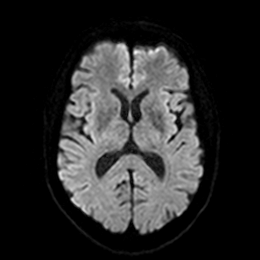
[im 27/36]
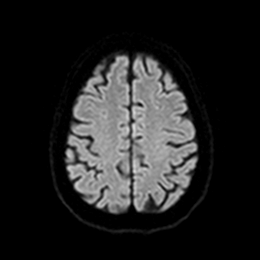
[im 36/36]
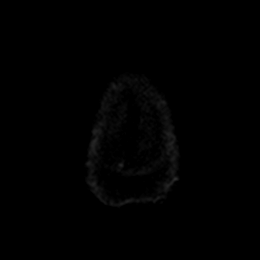

[Series 6: DWI · axial · 4.0mm · 0.88mm/px · z∈[-73,+66]mm · 5 of 36 slices shown (2 of 4)]
[im 1/36]
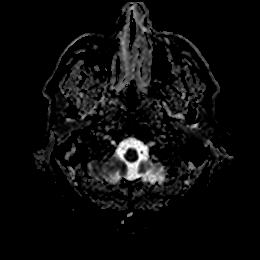
[im 9/36]
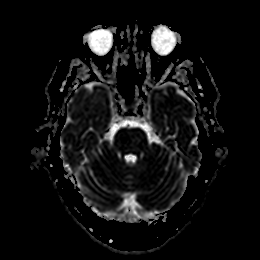
[im 18/36]
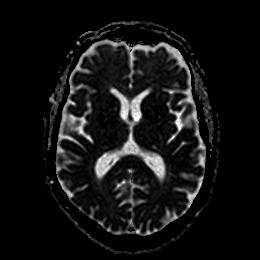
[im 27/36]
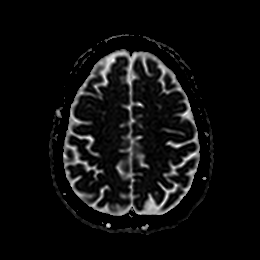
[im 36/36]
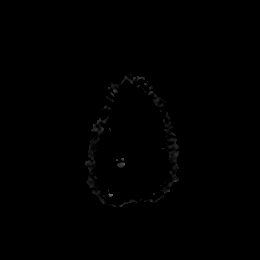

[Series 7: DWI · coronal · 4.0mm · 0.88mm/px · 5 of 32 slices shown (3 of 4)]
[im 1/32]
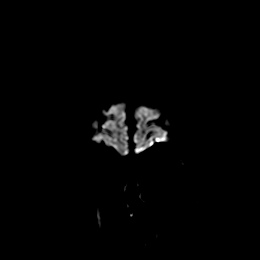
[im 8/32]
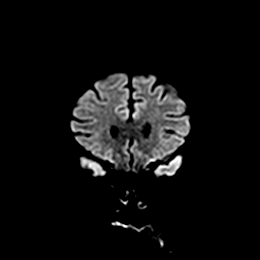
[im 16/32]
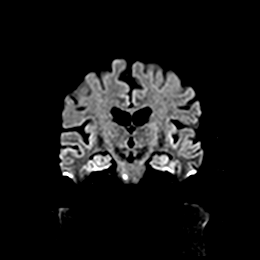
[im 24/32]
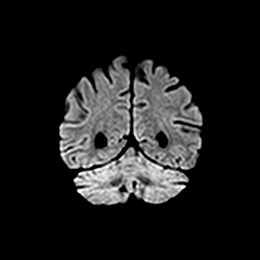
[im 32/32]
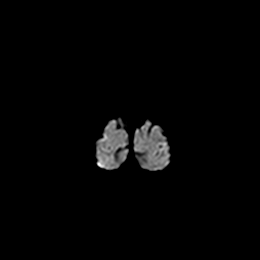

[Series 8: DWI · coronal · 4.0mm · 0.88mm/px · 5 of 32 slices shown (4 of 4)]
[im 1/32]
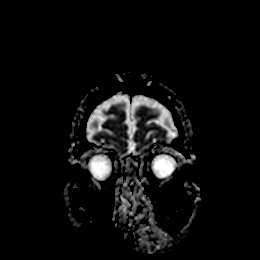
[im 8/32]
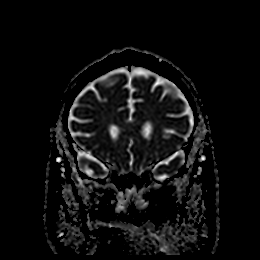
[im 16/32]
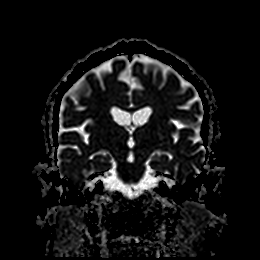
[im 24/32]
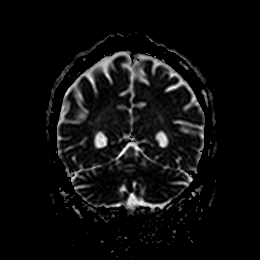
[im 32/32]
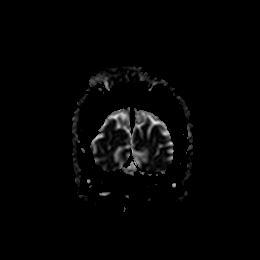

[Series 14: T1 · sagittal · 5.0mm · 0.83mm/px · 3 of 23 slices shown]
[im 1/23]
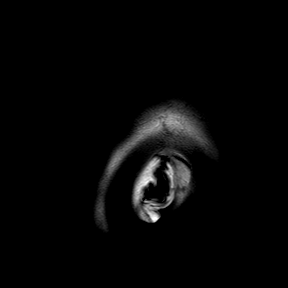
[im 12/23]
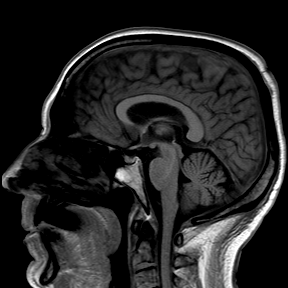
[im 23/23]
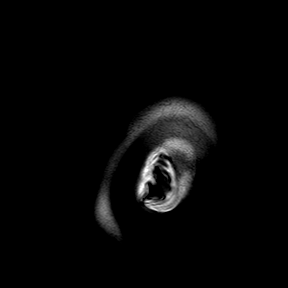

[Series 15: T2 · axial · 5.0mm · 0.72mm/px · z∈[-80,+73]mm · 3 of 23 slices shown (1 of 2)]
[im 1/23]
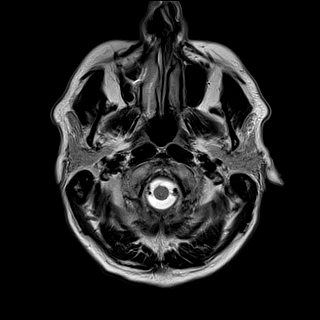
[im 12/23]
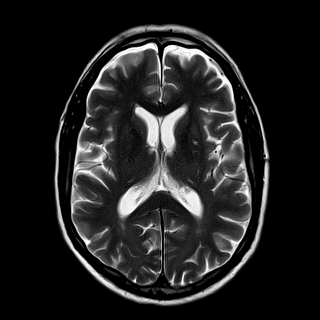
[im 23/23]
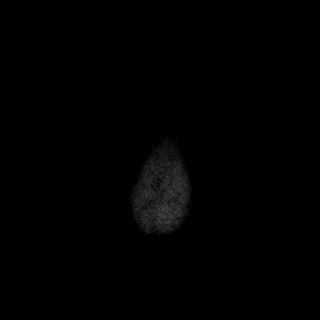

[Series 16: ax hemo · axial · 5.0mm · 0.86mm/px · z∈[-75,+69]mm · 4 of 25 slices shown]
[im 1/25]
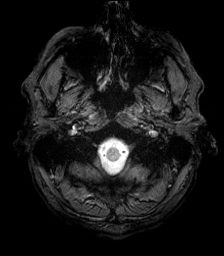
[im 9/25]
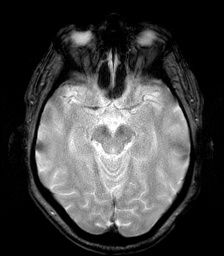
[im 17/25]
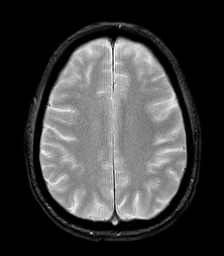
[im 25/25]
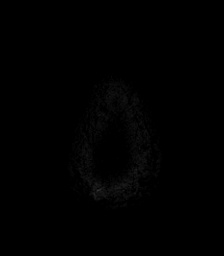

[Series 17: FLAIR · axial · 4.0mm · 0.43mm/px · z∈[-77,+71]mm · 5 of 38 slices shown]
[im 1/38]
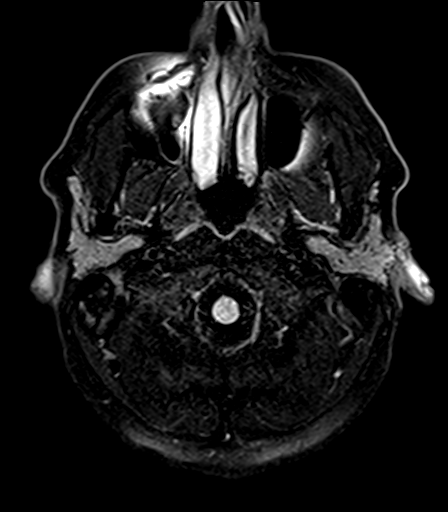
[im 10/38]
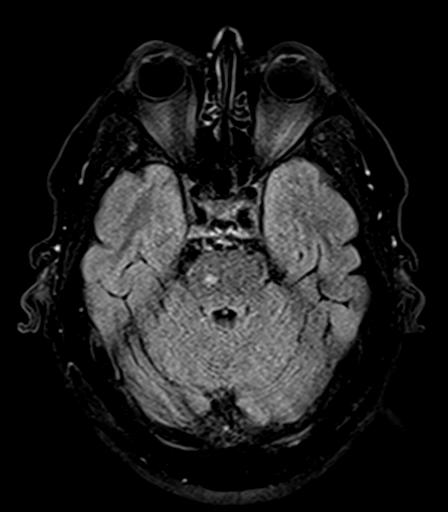
[im 19/38]
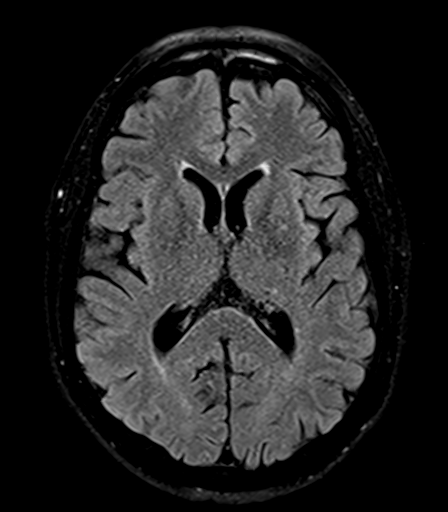
[im 28/38]
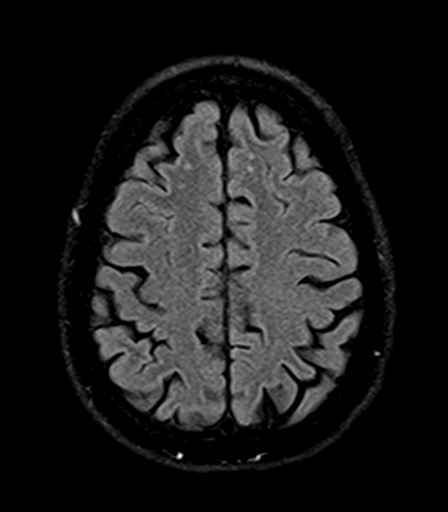
[im 38/38]
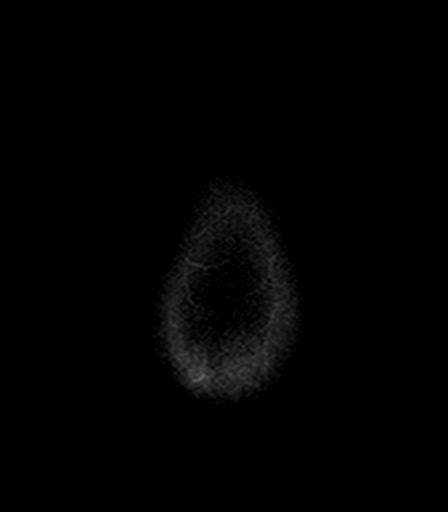

[Series 19: T2 · coronal · 5.0mm · 0.72mm/px · 4 of 28 slices shown (2 of 2)]
[im 1/28]
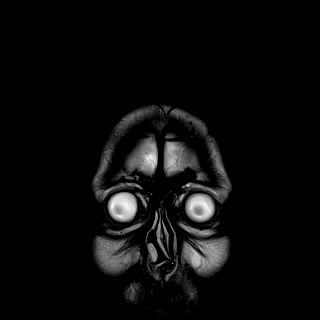
[im 10/28]
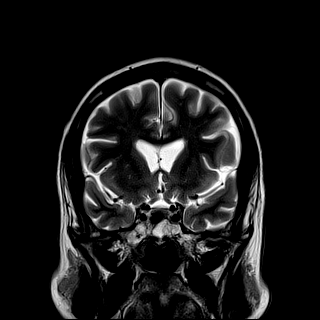
[im 19/28]
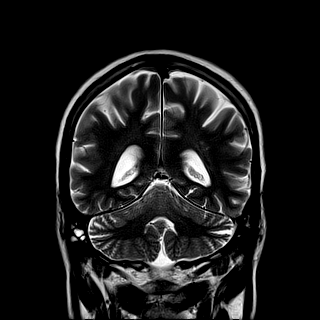
[im 28/28]
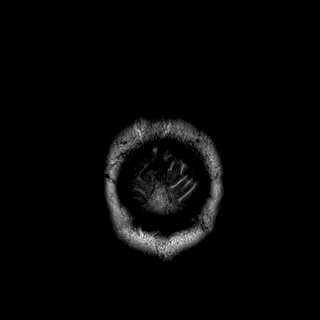

[39 of 48 positions shown; findings below may reference images not displayed]

FINDINGS: MRI HEAD FINDINGS

Brain: Acute nonhemorrhagic infarct is present in the right
paramedian pons measuring up to 10 mm. T2 hyperintensities are
associated with the acute/subacute infarct. No acute hemorrhage is
present. Minimal white matter changes are otherwise within normal
limits for age. The ventricles are of normal size. No significant
extraaxial fluid collection is present.

Basal ganglia are within normal limits. Cerebellum is unremarkable.
The internal auditory canals are within normal limits.

Vascular: Flow is present in the major intracranial arteries.

Skull and upper cervical spine: The craniocervical junction is
normal. Upper cervical spine is within normal limits. Marrow signal
is unremarkable.

Sinuses/Orbits: Some fluid is present in the inferior right mastoid
air cells. No obstructing nasopharyngeal lesion is present. The
globes and orbits are within normal limits.

MRA HEAD FINDINGS

Anterior circulation: The internal carotid arteries are within
normal limits from the high cervical segments through the ICA
termini. The A1 and M1 segments are normal. The anterior
communicating artery is patent. ACA and MCA branch vessels are
within normal limits.

Posterior circulation: The vertebral arteries are codominant. PICA
origins are just below the field of view. PICA arteries appear to be
within normal limits. Basilar artery is normal. The right posterior
cerebral artery originates from basilar tip. The left posterior
cerebral artery is of fetal type. PCA branch vessels are within
normal limits.

Anatomic variants: None
IMPRESSION: 1. Acute/subacute nonhemorrhagic infarct of the right paramedian
pons.
2. Normal MRA circle-of-Willis without significant proximal
stenosis, aneurysm, or branch vessel occlusion.

These results were called by telephone at the time of interpretation
acknowledged these results.

## 2021-09-13 IMAGING — US US CAROTID DUPLEX BILAT
1 series · 13 of 24 positions shown · non-contrast
Comparison: None.

CLINICAL DATA: Left-sided weakness

CVA
Hypertension
Stroke
Hyperlipidemia
Diabetes
Smoker
EXAM:
BILATERAL CAROTID DUPLEX ULTRASOUND
TECHNIQUE: Gray scale imaging, color Doppler and duplex ultrasound were
performed of bilateral carotid and vertebral arteries in the neck.

[Series 1: us carotid bilateral · 13 of 67 slices shown]
[im 1/67]
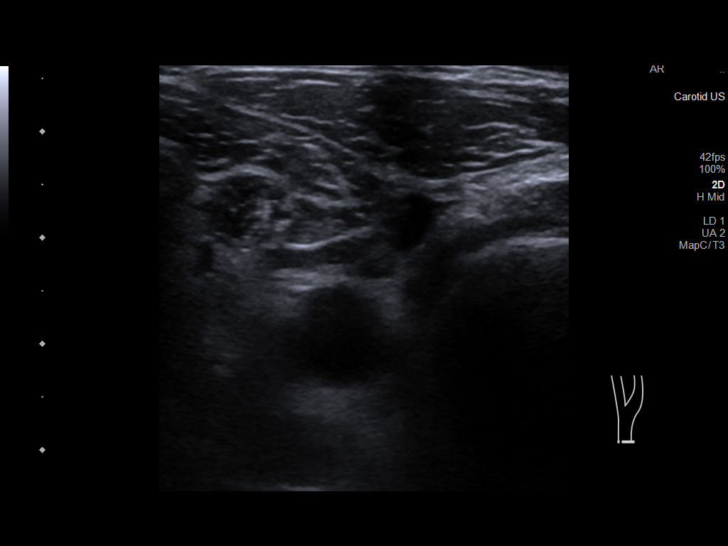
[im 6/67]
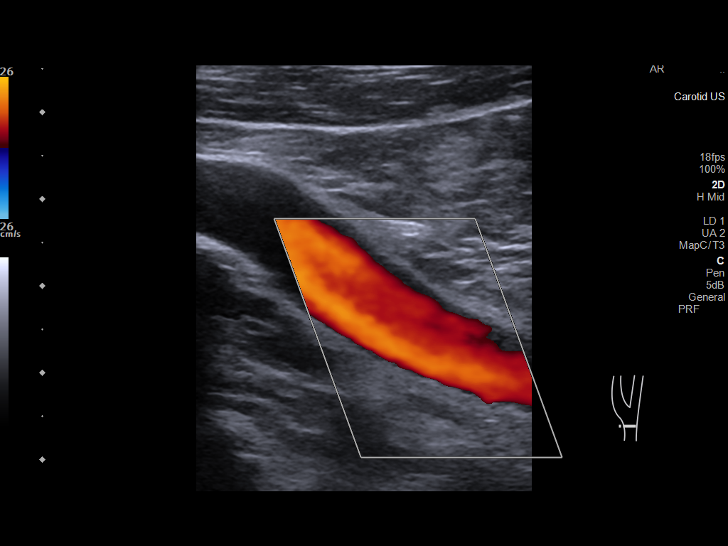
[im 12/67]
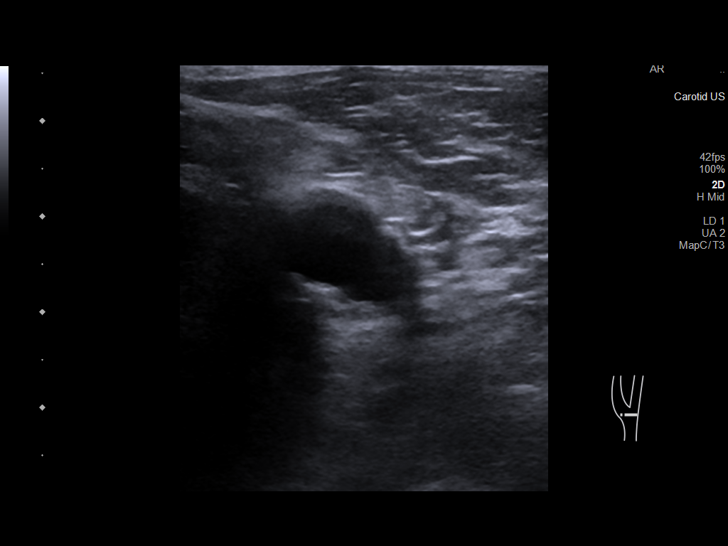
[im 18/67]
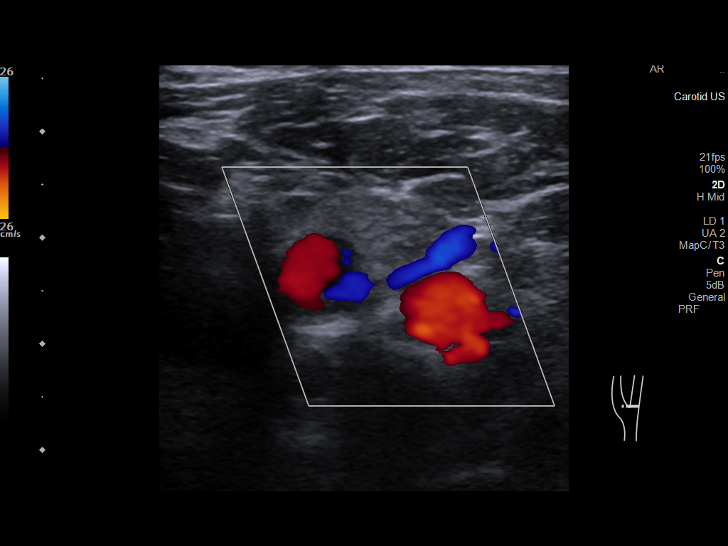
[im 23/67]
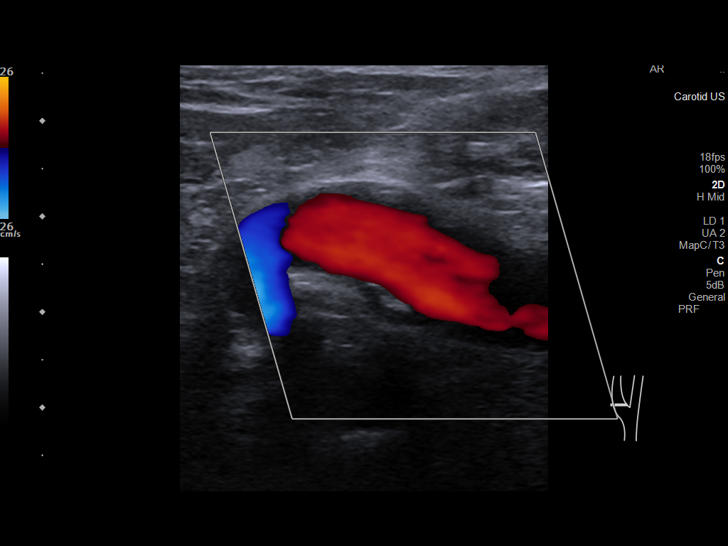
[im 29/67]
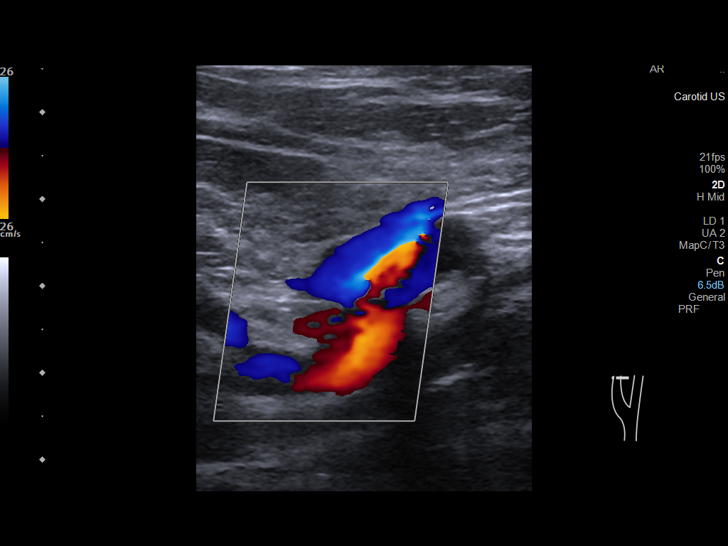
[im 35/67]
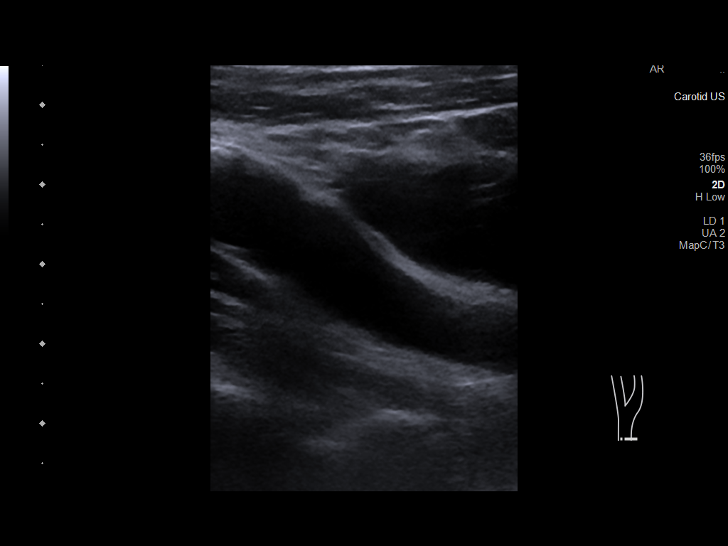
[im 38/67]
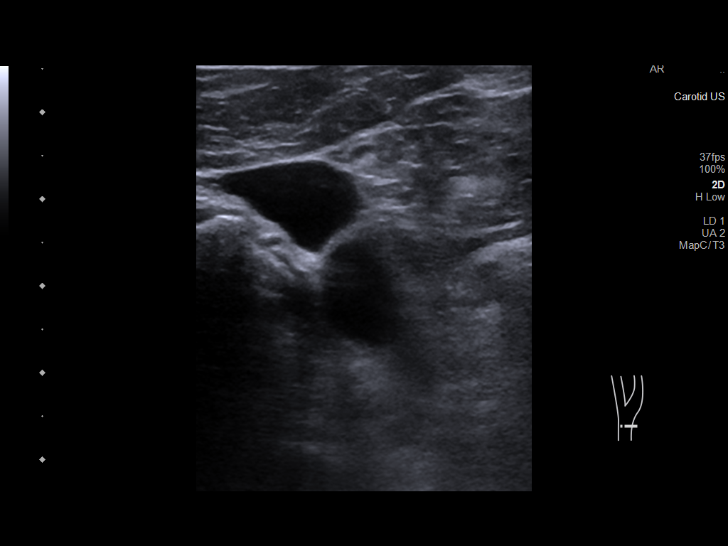
[im 44/67]
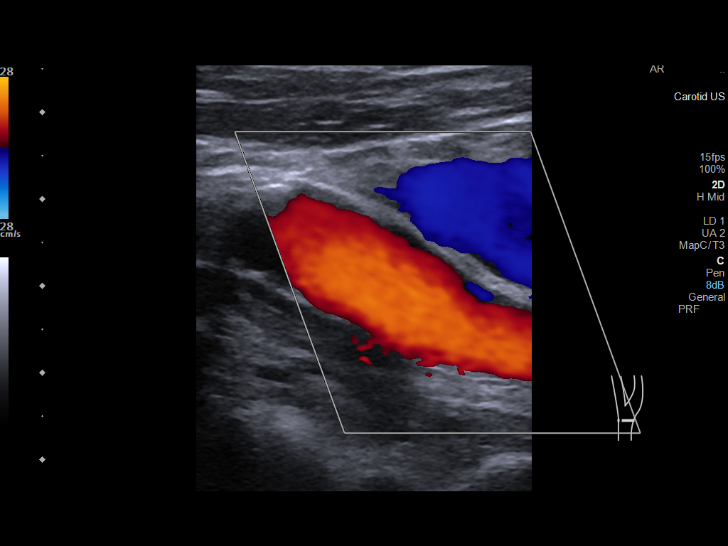
[im 49/67]
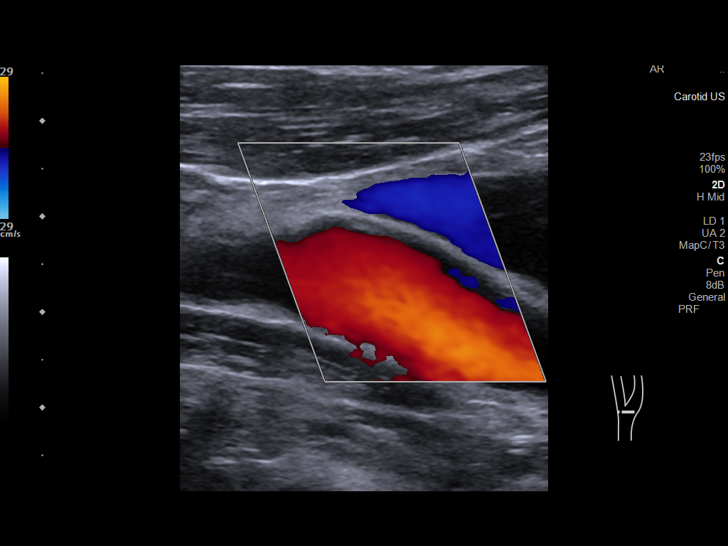
[im 55/67]
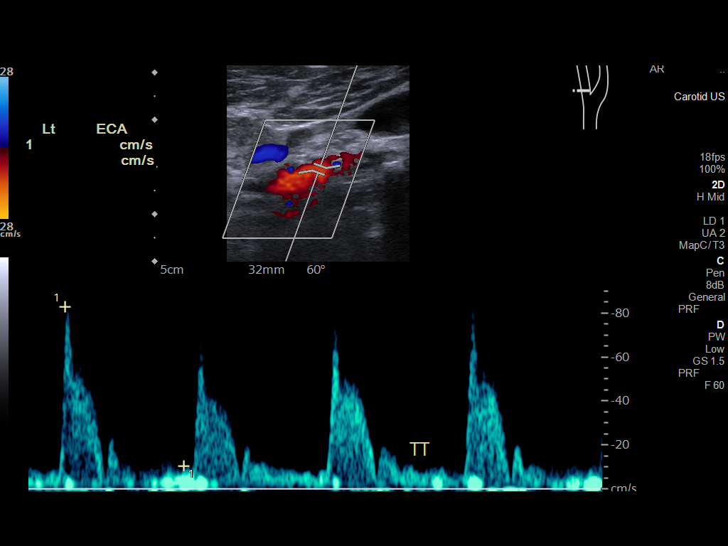
[im 61/67]
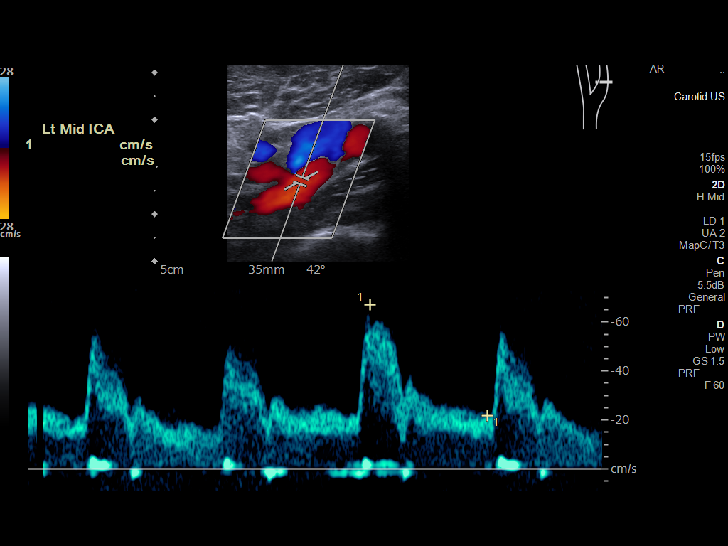
[im 67/67]
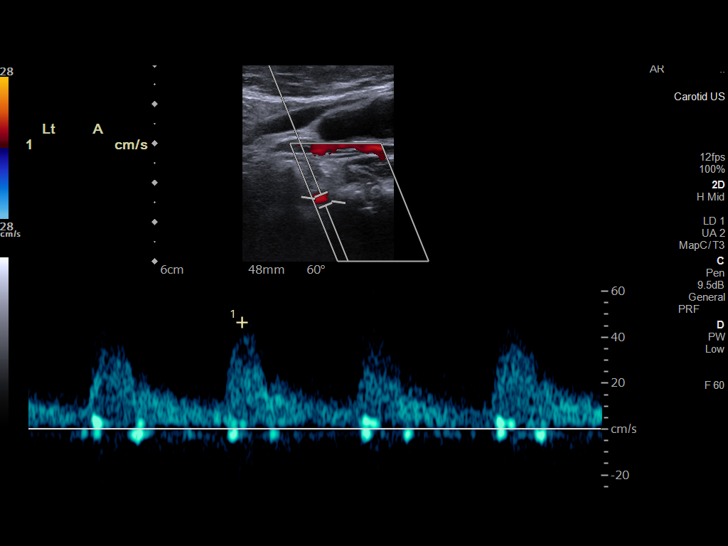

[13 of 24 positions shown; findings below may reference images not displayed]

FINDINGS: Criteria: Quantification of carotid stenosis is based on velocity
parameters that correlate the residual internal carotid diameter
with NASCET-based stenosis levels, using the diameter of the distal
internal carotid lumen as the denominator for stenosis measurement.

The following velocity measurements were obtained:

RIGHT

ICA: 85/26 cm/sec

CCA: 74/13 cm/sec

SYSTOLIC ICA/CCA RATIO:

ECA: 61 cm/sec

LEFT

ICA: 75/23 cm/sec

CCA: 61/12 cm/sec

SYSTOLIC ICA/CCA RATIO:

ECA: 83 cm/sec

RIGHT CAROTID ARTERY: Mild heterogeneous plaque of the carotid
bifurcation.

RIGHT VERTEBRAL ARTERY:  Limited visualization.

LEFT CAROTID ARTERY: Mild heterogeneous plaque of the carotid
bifurcation.

LEFT VERTEBRAL ARTERY:  Antegrade flow.
IMPRESSION: 1. Less than 50% stenosis of the internal carotid arteries.
2. Limited visualization of the right vertebral artery. Antegrade
flow seen within the left vertebral artery.

## 2021-09-13 IMAGING — MR MR MRA HEAD W/O CM
1 series · 48 of 48 positions shown · non-contrast
Comparison: CT head without contrast [DATE]

CLINICAL DATA: Near syncope. Slurred speech. Possible left facial
droop. Symptoms began yesterday.

EXAM:
MRI HEAD WITHOUT CONTRAST
MRA HEAD WITHOUT CONTRAST
TECHNIQUE: Multiplanar, multi-echo pulse sequences of the brain and surrounding
structures were acquired without intravenous contrast. Angiographic
images of the Circle of Willis were acquired using MRA technique
without intravenous contrast.

[Series 100: TOF · axial · 0.5mm · 0.76mm/px · z∈[-70,+8]mm · 48 of 168 slices shown]
[im 1/168]
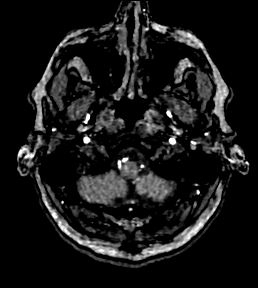
[im 4/168]
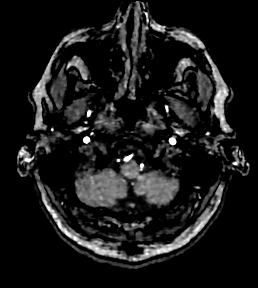
[im 8/168]
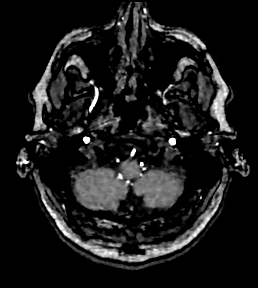
[im 11/168]
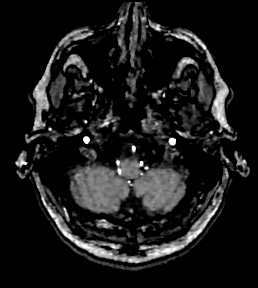
[im 15/168]
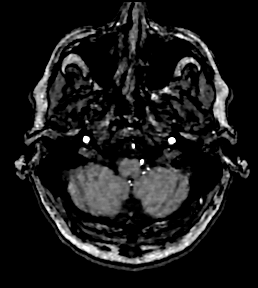
[im 18/168]
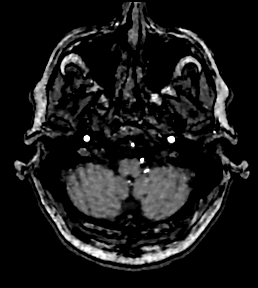
[im 22/168]
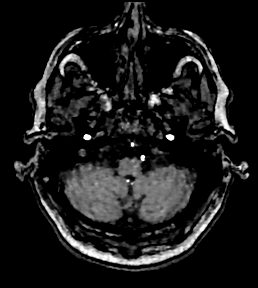
[im 25/168]
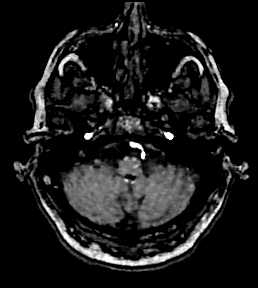
[im 29/168]
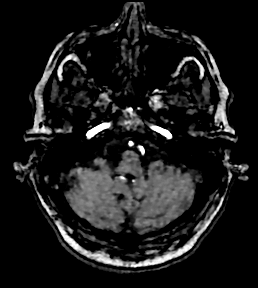
[im 32/168]
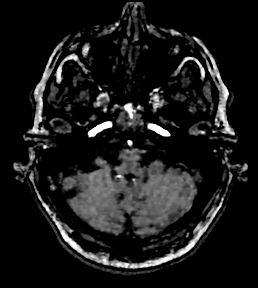
[im 36/168]
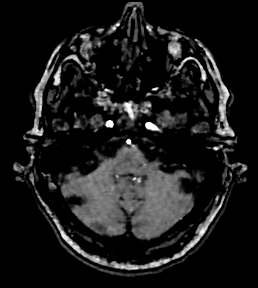
[im 40/168]
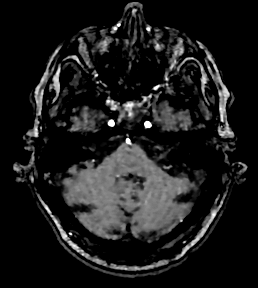
[im 43/168]
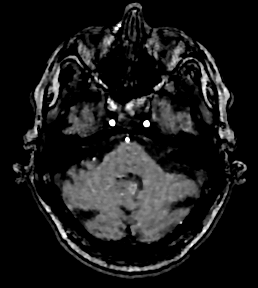
[im 47/168]
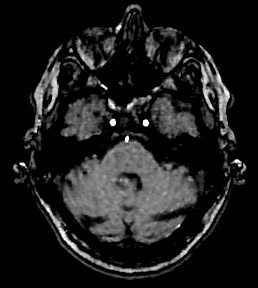
[im 50/168]
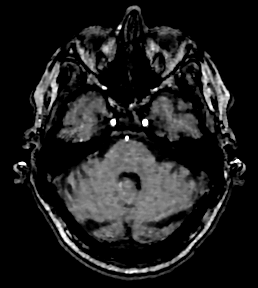
[im 54/168]
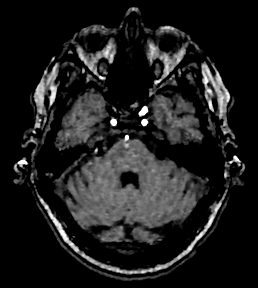
[im 57/168]
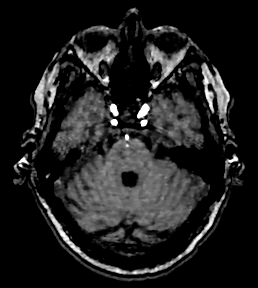
[im 61/168]
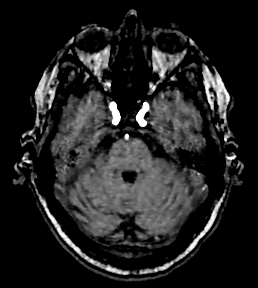
[im 64/168]
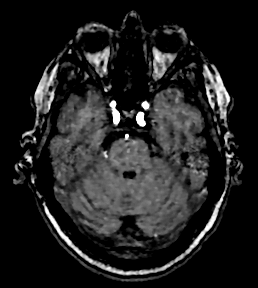
[im 68/168]
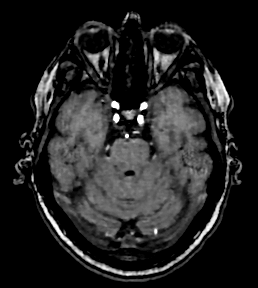
[im 72/168]
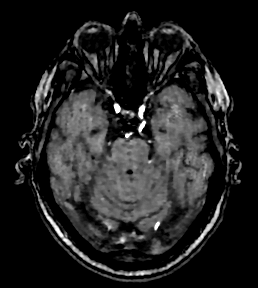
[im 75/168]
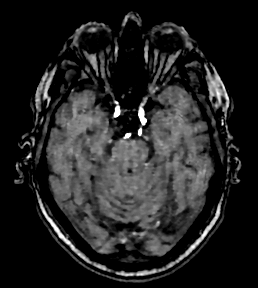
[im 79/168]
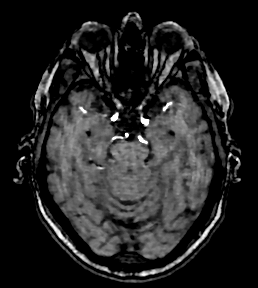
[im 82/168]
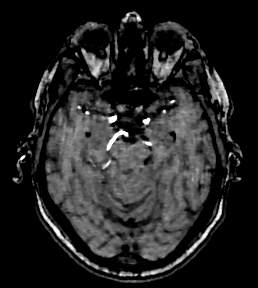
[im 86/168]
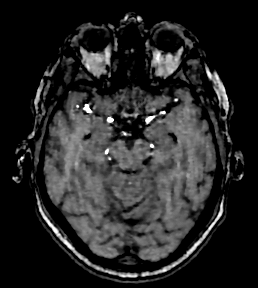
[im 89/168]
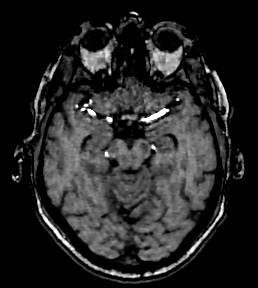
[im 93/168]
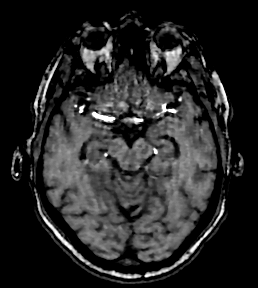
[im 96/168]
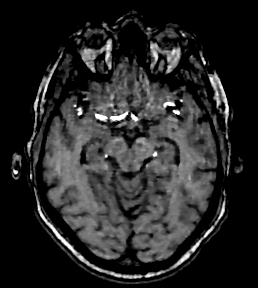
[im 100/168]
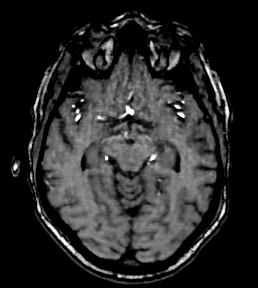
[im 104/168]
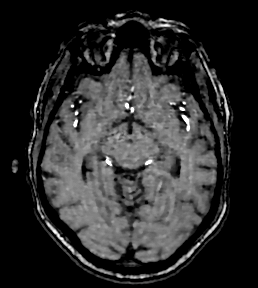
[im 107/168]
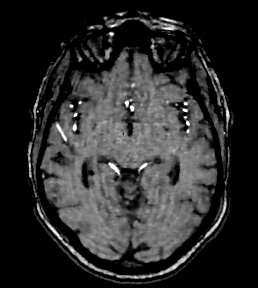
[im 111/168]
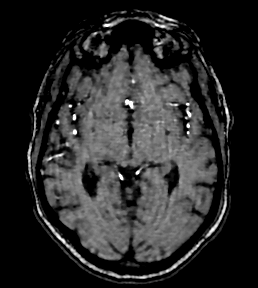
[im 114/168]
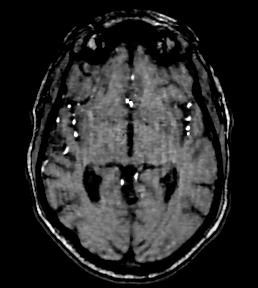
[im 118/168]
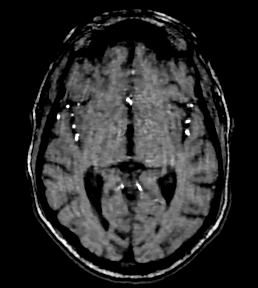
[im 121/168]
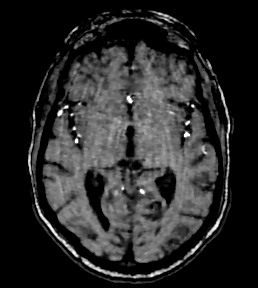
[im 125/168]
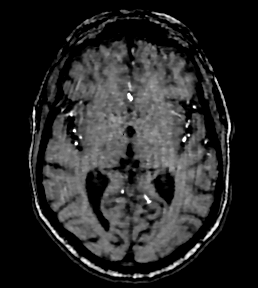
[im 128/168]
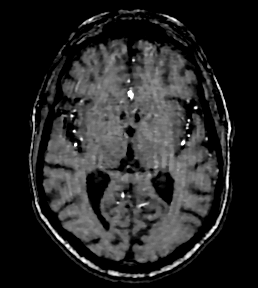
[im 132/168]
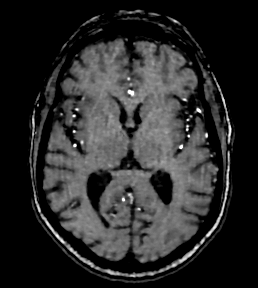
[im 136/168]
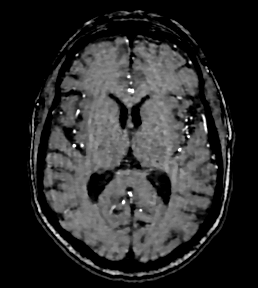
[im 139/168]
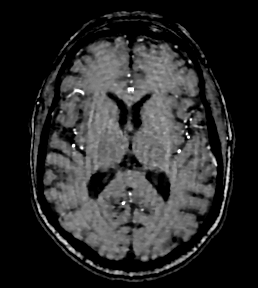
[im 143/168]
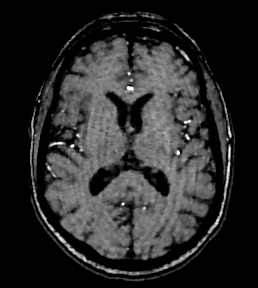
[im 146/168]
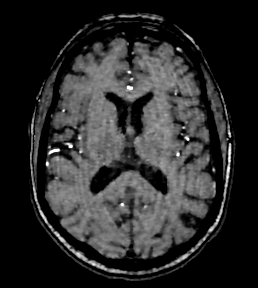
[im 150/168]
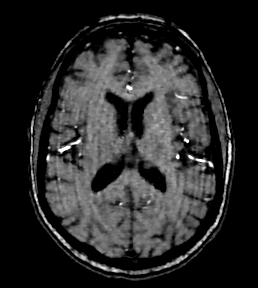
[im 153/168]
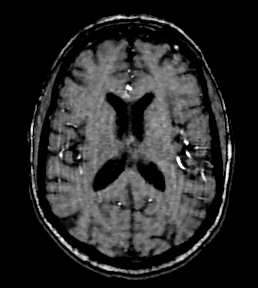
[im 157/168]
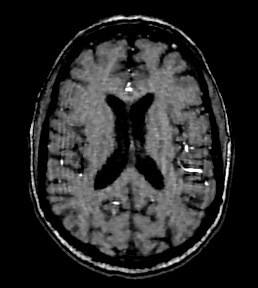
[im 160/168]
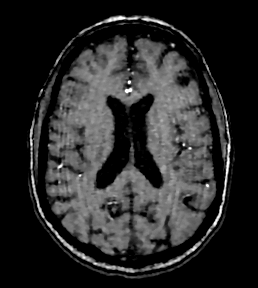
[im 164/168]
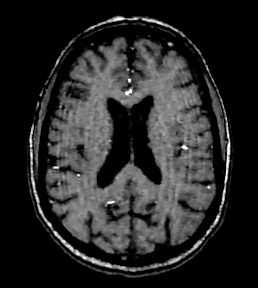
[im 168/168]
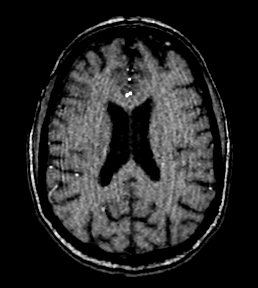

[48 of 48 positions shown; findings below may reference images not displayed]

FINDINGS: MRI HEAD FINDINGS

Brain: Acute nonhemorrhagic infarct is present in the right
paramedian pons measuring up to 10 mm. T2 hyperintensities are
associated with the acute/subacute infarct. No acute hemorrhage is
present. Minimal white matter changes are otherwise within normal
limits for age. The ventricles are of normal size. No significant
extraaxial fluid collection is present.

Basal ganglia are within normal limits. Cerebellum is unremarkable.
The internal auditory canals are within normal limits.

Vascular: Flow is present in the major intracranial arteries.

Skull and upper cervical spine: The craniocervical junction is
normal. Upper cervical spine is within normal limits. Marrow signal
is unremarkable.

Sinuses/Orbits: Some fluid is present in the inferior right mastoid
air cells. No obstructing nasopharyngeal lesion is present. The
globes and orbits are within normal limits.

MRA HEAD FINDINGS

Anterior circulation: The internal carotid arteries are within
normal limits from the high cervical segments through the ICA
termini. The A1 and M1 segments are normal. The anterior
communicating artery is patent. ACA and MCA branch vessels are
within normal limits.

Posterior circulation: The vertebral arteries are codominant. PICA
origins are just below the field of view. PICA arteries appear to be
within normal limits. Basilar artery is normal. The right posterior
cerebral artery originates from basilar tip. The left posterior
cerebral artery is of fetal type. PCA branch vessels are within
normal limits.

Anatomic variants: None
IMPRESSION: 1. Acute/subacute nonhemorrhagic infarct of the right paramedian
pons.
2. Normal MRA circle-of-Willis without significant proximal
stenosis, aneurysm, or branch vessel occlusion.

These results were called by telephone at the time of interpretation
acknowledged these results.

## 2021-09-13 MED ORDER — INFLUENZA VAC A&B SA ADJ QUAD 0.5 ML IM PRSY
0.5000 mL | PREFILLED_SYRINGE | INTRAMUSCULAR | Status: AC
  Administered 2021-09-14: 0.5 mL via INTRAMUSCULAR
  Filled 2021-09-13: qty 0.5

## 2021-09-13 MED ORDER — ALPRAZOLAM 0.25 MG PO TABS
0.2500 mg | ORAL_TABLET | Freq: Three times a day (TID) | ORAL | Status: DC | PRN
  Administered 2021-09-13: 0.25 mg via ORAL
  Filled 2021-09-13 (×2): qty 1

## 2021-09-13 MED ORDER — NICOTINE 21 MG/24HR TD PT24
35.0000 mg | MEDICATED_PATCH | Freq: Every day | TRANSDERMAL | Status: DC
  Administered 2021-09-13: 35 mg via TRANSDERMAL
  Administered 2021-09-14: 21 mg via TRANSDERMAL
  Filled 2021-09-13 (×2): qty 1

## 2021-09-13 MED ORDER — LORAZEPAM 2 MG/ML IJ SOLN
1.0000 mg | Freq: Once | INTRAMUSCULAR | Status: AC
  Administered 2021-09-13: 1 mg via INTRAVENOUS
  Filled 2021-09-13: qty 1

## 2021-09-13 NOTE — Evaluation (Signed)
Occupational Therapy Evaluation Patient Details Name: Bryce Adams MRN: [id number] DOB: 1951-08-14 Today's Date: 09/13/2021   History of Present Illness Bryce Adams  is a 70 y.o. male, with history of DMII, HLD, HTN, Thyroid disease, tobacco use disorder, renal disorder and more presents to the ED with a chief complaint of left sided weakness.  Patient is quite irritable during my exam.  Wife reports that that is due to him nicotine cravings as he is a very heavy smoker.  Answer when asked, he smokes.  I offered a nicotine patch to him but advised that I would have to know how many packs a day he smokes to calculate the correct amount of nicotine.  Patient abruptly declined with some involvement of profanity as well.  He refused to give any history.  He told me to take a history from his wife.  He did report that he is not in any pain right now  Wife reports that patient has had left-sided weakness for about a week.  They went to the PCP and got blood work done.  He had hypomagnesemia, and then was found to have CKD, CHF, UTI according to the wife.  He was started on Jardiance and antibiotic as well as magnesium supplement.  3 days ago his weakness started worsening.  Wife thought it might be due to the new medication so she held them, but it did not give any relief.  Patient complained of near syncope today, slurred speech, and wife thinks she saw left facial droop as well.  That started between 1130 and 12 PM, just before lunch.  They called PCP office who advised him to come into the ED.  Patient reports he has nothing to add to this history.  He still will not say, she smokes.  Patient threatening to leave AMA 5 continue asking questions, so the medical interview portion of the exam was abruptly cut short.   Clinical Impression   Pt agreeable to OT/PT co-evaluation. Pt presents with mild L UE weakness compared to R side. Pt also demonstrates mild balance deficits during transfers without RW. Pt was  noted to lose balance to L side during heel to toe standing. Pt was able to don socks at bed level without physical assist. Pt is not recommended for further acute OT services and will be discharged to care of nursing staff for remaining length of stay.      Recommendations for follow up therapy are one component of a multi-disciplinary discharge planning process, led by the attending physician.  Recommendations may be updated based on patient status, additional functional criteria and insurance authorization.   Follow Up Recommendations  Outpatient OT    Equipment Recommendations  None recommended by OT           Precautions / Restrictions Precautions Precautions: Fall Restrictions Weight Bearing Restrictions: No      Mobility Bed Mobility Overal bed mobility: Independent                  Transfers Overall transfer level: Needs assistance   Transfers: Sit to/from Stand;Stand Pivot Transfers Sit to Stand: Independent Stand pivot transfers: Modified independent (Device/Increase time);Supervision       General transfer comment: Mild balance deficits during ambulation.    Balance Overall balance assessment: Needs assistance Sitting-balance support: No upper extremity supported;Feet supported Sitting balance-Leahy Scale: Normal Sitting balance - Comments: seated at EOB   Standing balance support: No upper extremity supported;During functional activity Standing balance-Leahy Scale: Good Standing balance  comment: Mild L side lean. Lost balance to L side when standing heel to toe with L foot forward.                           ADL either performed or assessed with clinical judgement   ADL Overall ADL's : Needs assistance/impaired                     Lower Body Dressing: Modified independent;Bed level Lower Body Dressing Details (indicate cue type and reason): Donning socks with mild labored movement for L LE. Toilet Transfer: Modified  Independent;Supervision/safety;Ambulation Toilet Transfer Details (indicate cue type and reason): Partially simulated via EOB sit to stand followed by ambulation and return to chair. Pt demonstrates mild balance deficits during transfer.         Functional mobility during ADLs: Modified independent;Supervision/safety       Vision Baseline Vision/History: 1 Wears glasses Ability to See in Adequate Light: 0 Adequate Patient Visual Report: No change from baseline Vision Assessment?: No apparent visual deficits                Pertinent Vitals/Pain Pain Assessment: No/denies pain     Hand Dominance Right   Extremity/Trunk Assessment Upper Extremity Assessment Upper Extremity Assessment: LUE deficits/detail LUE Deficits / Details: 4+/5 grossly with expection of elbow extension at 4/5. Mild deficits compared to 5/5 for R UE. LUE Sensation: WNL LUE Coordination: WNL   Lower Extremity Assessment Lower Extremity Assessment: Defer to PT evaluation   Cervical / Trunk Assessment Cervical / Trunk Assessment: Normal   Communication Communication Communication: No difficulties   Cognition Arousal/Alertness: Awake/alert Behavior During Therapy: WFL for tasks assessed/performed Overall Cognitive Status: Within Functional Limits for tasks assessed                                                      Home Living Family/patient expects to be discharged to:: Private residence Living Arrangements: Spouse/significant other Available Help at Discharge: Family;Available 24 hours/day Type of Home: House Home Access: Level entry     Home Layout: One level     Bathroom Shower/Tub: Occupational psychologist: Handicapped height Bathroom Accessibility: Yes   Home Equipment: None          Prior Functioning/Environment Level of Independence: Independent        Comments: Pt report independent with community ambulation, ADLs/IADLs, drives. 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        OT Problem List: Decreased strength;Impaired balance (sitting and/or standing)      OT Treatment/Interventions:      OT Goals(Current goals can be found in the care plan section) Acute Rehab OT Goals Patient Stated Goal: retrurn home                   Co-evaluation PT/OT/SLP Co-Evaluation/Treatment: Yes Reason for Co-Treatment: To address functional/ADL transfers   OT goals addressed during session: ADL's and self-care      AM-PAC OT "6 Clicks" Daily Activity     Outcome Measure Help from another person eating meals?: None Help from another person taking care of personal grooming?: None Help from another person toileting, which includes using toliet, bedpan, or urinal?: None Help from another person bathing (including washing, rinsing, drying)?: None Help  from another person to put on and taking off regular upper body clothing?: None Help from another person to put on and taking off regular lower body clothing?: None 6 Click Score: 24   End of Session    Activity Tolerance: Patient tolerated treatment well Patient left: in bed;with call bell/phone within reach;with family/visitor present  OT Visit Diagnosis: Unsteadiness on feet (R26.81);Other abnormalities of gait and mobility (R26.89);Muscle weakness (generalized) (M62.81);Hemiplegia and hemiparesis Hemiplegia - Right/Left: Left Hemiplegia - dominant/non-dominant: Non-Dominant Hemiplegia - caused by: Cerebral infarction                Time: 8329-1916 OT Time Calculation (min): 20 min Charges:  OT General Charges $OT Visit: 1 Visit OT Evaluation $OT Eval Low Complexity: 1 Low  Casie Sturgeon OT, MOT  Larey Seat 09/13/2021, 10:50 AM

## 2021-09-13 NOTE — Consult Note (Addendum)
Bryce A. Merlene Laughter, MD     [email or web address]          Bryce Adams is an 70 y.o. male.   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 Risk factors hypertension, diabetes and age. Dual antiplatelet agents are recommended for 1 month. Afterwards Plavix is recommended. Continue with blood pressure control, blood sugar control in the Lipitor. Smoking cessation is discussed.     This is 70 year old right-handed white male who presents with the acute onset weakness involving the left side at. This caused some difficulty with ambulating. He does not necessarily report speaking but his wife states that he clearly has some slurring his speech.  Left hand seems particularly weak and ataxic. Numbness is not reported. He denies any chest pain, palpitation or loss of consciousness. He has been on aspirin 81 mg has been compliant with this. The review systems otherwise negative.       Blood pressure (!) 145/80, pulse 78, temperature 97.8 F (36.6 C), temperature source Oral, resp. rate 20, height 5\' 9"  (1.753 m), weight 85.9 kg, SpO2 99 %.   GENERAL: This is a pleasant male who is doing well this.  HEENT:  Neck is supple no trauma noted.  ABDOMEN: soft  EXTREMITIES: No edema   BACK: Normal  SKIN: Normal by inspection.    MENTAL STATUS: Alert and oriented. Speech -  mildly dysarthric;language and cognition are generally intact. Judgment and insight normal.   CRANIAL NERVES: Pupils are equal, round and reactive to light and accomodation; extra ocular movements are full, there is no significant nystagmus; visual fields are full; upper and lower facial muscles are normal in strength and symmetric, there is mild flattening of the nasolabial fold on L; tongue is midline; uvula is midline; shoulder elevation is normal.  MOTOR:  There is mild drift of the left upper extremity and left leg. There is mild  to moderate weakness intrinsic hand muscles and hand dexterity is significantly impaired. Other muscle groups of the left upper extremity graded as 4/5.  Left leg is also graded as 4+/ 5. Right side shows normal tone, bulk and strength; no pronator drift.  COORDINATION: Left finger to nose is normal, right finger to nose is normal, No rest tremor; no intention tremor; no postural tremor; no bradykinesia.  REFLEXES: Deep tendon reflexes are symmetrical and normal.   SENSATION: Normal to light touch, temperature, and pain.     Past Medical History:  Diagnosis Date   Diabetes mellitus without complication ([place])    Hyperlipemia    Hypertension    Renal disorder    Thyroid disease     Past Surgical History:  Procedure Laterality Date   CARDIAC CATHETERIZATION     CARPAL TUNNEL RELEASE     HAND TENDON SURGERY      Family History  Problem Relation Age of Onset   Colon polyps Mother    Colon cancer Mother    Esophageal cancer Neg Hx    Rectal cancer Neg Hx    Stomach cancer Neg Hx     Social History:  reports that he has been smoking cigarettes. He has been smoking an average of 2 packs per day. He has never used smokeless tobacco. He reports current alcohol use of about 1.0 standard drink per week. He reports that he does not use drugs.  Allergies: No Known Allergies  Medications: Prior to Admission medications   Medication Sig Start Date End  Date Taking? Authorizing Provider  aspirin EC 81 MG tablet Take 81 mg by mouth daily.   Yes [provider]  atorvastatin (LIPITOR) 20 MG tablet Take 20 mg by mouth every morning.   Yes [provider]  cefdinir (OMNICEF) 300 MG capsule Take 300 mg by mouth 2 (two) times daily. 09/09/21  Yes [provider]  famotidine (PEPCID) 20 MG tablet Take 20 mg by mouth daily.   Yes [provider]  hydrochlorothiazide (HYDRODIURIL) 25 MG tablet Take 25 mg by mouth every morning.   Yes [provider]   irbesartan (AVAPRO) 300 MG tablet Take 1 tablet by mouth daily. 02/11/21  Yes [provider]  levothyroxine (SYNTHROID, LEVOTHROID) 50 MCG tablet Take 50 mcg by mouth every morning.   Yes [provider]  Magnesium Oxide 400 MG CAPS Take 1 capsule by mouth 2 (two) times daily. 09/09/21  Yes [provider]  metFORMIN (GLUCOPHAGE) 1000 MG tablet Take 1,000 mg by mouth 2 (two) times daily with a meal.   Yes [provider]  Multiple Vitamin (MULTIVITAMIN WITH MINERALS) TABS Take 1 tablet by mouth daily.   Yes [provider]  omega-3 acid ethyl esters (LOVAZA) 1 G capsule Take 2 g by mouth 2 (two) times daily.   Yes [provider]  sitaGLIPtin (JANUVIA) 100 MG tablet Take 100 mg by mouth daily.   Yes [provider]  valsartan (DIOVAN) 160 MG tablet Take 160 mg by mouth daily.   Yes [provider]  VICTOZA 18 MG/3ML SOPN Inject 1.8 mg into the skin every evening. 02/27/17  Yes [provider]  empagliflozin (JARDIANCE) 10 MG TABS tablet Take by mouth. 09/09/21   [provider]  gabapentin (NEURONTIN) 300 MG capsule TK 1 C PO BID Patient not taking: No sig reported 01/15/17   [provider]  Vitamin D, Ergocalciferol, (DRISDOL) 50000 UNITS CAPS Take 50,000 Units by mouth every 7 (seven) days. fridays Patient not taking: No sig reported    [provider]    Scheduled Meds:  aspirin EC  81 mg Oral Daily   atorvastatin  20 mg Oral q morning   famotidine  20 mg Oral Daily   heparin  5,000 Units Subcutaneous Q8H   [START ON 09/14/2021] influenza vaccine adjuvanted  0.5 mL Intramuscular Tomorrow-1000   insulin aspart  0-15 Units Subcutaneous TID WC   insulin aspart  0-5 Units Subcutaneous QHS   levothyroxine  50 mcg Oral q morning   LORazepam  1 mg Intravenous Once   nicotine  35 mg Transdermal Daily   Continuous Infusions: PRN Meds:.acetaminophen **OR** acetaminophen (TYLENOL) oral  liquid 160 mg/5 mL **OR** acetaminophen, ALPRAZolam     Results for orders placed or performed during the hospital encounter of 09/12/21 (from the past 48 hour(s))  Protime-INR     Status: None   Collection Time: 09/12/21  5:51 PM  Result Value Ref Range   Prothrombin Time 12.5 11.4 - 15.2 seconds   INR 0.9 0.8 - 1.2    Comment: (NOTE) INR goal varies based on device and disease states. Performed at [facility], [address], [place], [place] [address]   APTT     Status: None   Collection Time: 09/12/21  5:51 PM  Result Value Ref Range   aPTT 27 24 - 36 seconds    Comment: Performed at [facility], [address] [place], [place] [address]  CBC     Status: Abnormal  Collection Time: 09/12/21  5:51 PM  Result Value Ref Range   WBC 11.0 (H) 4.0 - 10.5 K/uL   RBC 4.22 4.22 - 5.81 MIL/uL   Hemoglobin 13.8 13.0 - 17.0 g/dL   HCT 39.8 39.0 - 52.0 %   MCV 94.3 80.0 - 100.0 fL   MCH 32.7 26.0 - 34.0 pg   MCHC 34.7 30.0 - 36.0 g/dL   RDW 12.6 11.5 - 15.5 %   Platelets 246 150 - 400 K/uL   nRBC 0.0 0.0 - 0.2 %    Comment: Performed at [facility], [address], [place], [place] [address]  Differential     Status: Abnormal   Collection Time: 09/12/21  5:51 PM  Result Value Ref Range   Neutrophils Relative % 70 %   Neutro Abs 7.7 1.7 - 7.7 K/uL   Lymphocytes Relative 16 %   Lymphs Abs 1.8 0.7 - 4.0 K/uL   Monocytes Relative 9 %   Monocytes Absolute 0.9 0.1 - 1.0 K/uL   Eosinophils Relative 3 %   Eosinophils Absolute 0.3 0.0 - 0.5 K/uL   Basophils Relative 1 %   Basophils Absolute 0.1 0.0 - 0.1 K/uL   Immature Granulocytes 1 %   Abs Immature Granulocytes 0.15 (H) 0.00 - 0.07 K/uL    Comment: Performed at [facility], [address], [place], [place] [address]  Comprehensive metabolic panel     Status: Abnormal   Collection Time: 09/12/21  5:51 PM  Result Value Ref Range   Sodium 133 (L) 135 - 145 mmol/L   Potassium 4.8 3.5 - 5.1 mmol/L   Chloride 100 98 - 111 mmol/L    CO2 20 (L) 22 - 32 mmol/L   Glucose, Bld 186 (H) 70 - 99 mg/dL    Comment: Glucose reference range applies only to samples taken after fasting for at least 8 hours.   BUN 33 (H) 8 - 23 mg/dL   Creatinine, Ser 1.46 (H) 0.61 - 1.24 mg/dL   Calcium 9.2 8.9 - 10.3 mg/dL   Total Protein 7.1 6.5 - 8.1 g/dL   Albumin 3.9 3.5 - 5.0 g/dL   AST 34 15 - 41 U/L   ALT 44 0 - 44 U/L   Alkaline Phosphatase 88 38 - 126 U/L   Total Bilirubin 0.2 (L) 0.3 - 1.2 mg/dL   GFR, Estimated 52 (L) >60 mL/min    Comment: (NOTE) Calculated using the CKD-EPI Creatinine Equation (2021)    Anion gap 13 5 - 15    Comment: Performed at [facility], [address], [place],  [address]  Resp Panel by RT-PCR (Flu A&B, Covid) Nasopharyngeal Swab     Status: None   Collection Time: 09/12/21  7:37 PM   Specimen: Nasopharyngeal Swab; Nasopharyngeal(NP) swabs in vial transport medium  Result Value Ref Range   SARS Coronavirus 2 by RT PCR NEGATIVE NEGATIVE    Comment: (NOTE) SARS-CoV-2 target nucleic acids are NOT DETECTED.  The SARS-CoV-2 RNA is generally detectable in upper respiratory specimens during the acute phase of infection. The lowest concentration of SARS-CoV-2 viral copies this assay can detect is 138 copies/mL. A negative result does not preclude SARS-Cov-2 infection and should not be used as the sole basis for treatment or other patient management decisions. A negative result may occur with  improper specimen collection/handling, submission of specimen other than nasopharyngeal swab, presence of viral mutation(s) within the areas targeted by this assay, and inadequate number of viral copies(<138 copies/mL). A negative  result must be combined with clinical observations, patient history, and epidemiological information. The expected result is Negative.  Fact Sheet for Patients:  EntrepreneurPulse.com.au  Fact Sheet for Healthcare Providers:   IncredibleEmployment.be  This test is no t yet approved or cleared by the [place] FDA and  has been authorized for detection and/or diagnosis of SARS-CoV-2 by FDA under an Emergency Use Authorization (EUA). This EUA will remain  in effect (meaning this test can be used) for the duration of the COVID-19 declaration under Section 564(b)(1) of the Act, 21 U.S.C.section 360bbb-3(b)(1), unless the authorization is terminated  or revoked sooner.       Influenza A by PCR NEGATIVE NEGATIVE   Influenza B by PCR NEGATIVE NEGATIVE    Comment: (NOTE) The Xpert Xpress SARS-CoV-2/FLU/RSV plus assay is intended as an aid in the diagnosis of influenza from Nasopharyngeal swab specimens and should not be used as a sole basis for treatment. Nasal washings and aspirates are unacceptable for Xpert Xpress SARS-CoV-2/FLU/RSV testing.  Fact Sheet for Patients: EntrepreneurPulse.com.au  Fact Sheet for Healthcare Providers: IncredibleEmployment.be  This test is not yet approved or cleared by the [place] FDA and has been authorized for detection and/or diagnosis of SARS-CoV-2 by FDA under an Emergency Use Authorization (EUA). This EUA will remain in effect (meaning this test can be used) for the duration of the COVID-19 declaration under Section 564(b)(1) of the Act, 21 U.S.C. section 360bbb-3(b)(1), unless the authorization is terminated or revoked.  Performed at [facility], [address], [place], [place] [address]   Urinalysis, Routine w reflex microscopic Urine, Clean Catch     Status: Abnormal   Collection Time: 09/12/21  8:41 PM  Result Value Ref Range   Color, Urine STRAW (A) YELLOW   APPearance CLEAR CLEAR   Specific Gravity, Urine 1.018 1.005 - 1.030   pH 7.0 5.0 - 8.0   Glucose, UA >=500 (A) NEGATIVE mg/dL   Hgb urine dipstick NEGATIVE NEGATIVE   Bilirubin Urine NEGATIVE NEGATIVE   Ketones, ur 5 (A) NEGATIVE mg/dL    Protein, ur 100 (A) NEGATIVE mg/dL   Nitrite NEGATIVE NEGATIVE   Leukocytes,Ua NEGATIVE NEGATIVE   RBC / HPF 0-5 0 - 5 RBC/hpf   WBC, UA 0-5 0 - 5 WBC/hpf   Bacteria, UA NONE SEEN NONE SEEN    Comment: Performed at [facility], [address] [place], [place] [address]  CBG monitoring, ED     Status: Abnormal   Collection Time: 09/12/21 10:18 PM  Result Value Ref Range   Glucose-Capillary 186 (H) 70 - 99 mg/dL    Comment: Glucose reference range applies only to samples taken after fasting for at least 8 hours.  HIV Antibody (routine testing w rflx)     Status: None   Collection Time: 09/13/21  5:38 AM  Result Value Ref Range   HIV Screen 4th Generation wRfx Non Reactive Non Reactive    Comment: Performed at [facility], [address], [place], [place] [address]  Hemoglobin A1c     Status: Abnormal   Collection Time: 09/13/21  5:38 AM  Result Value Ref Range   Hgb A1c MFr Bld 9.4 (H) 4.8 - 5.6 %    Comment: (NOTE) Pre diabetes:          5.7%-6.4%  Diabetes:              >6.4%  Glycemic control for   <7.0% adults with diabetes    Mean Plasma Glucose 223.08  mg/dL    Comment: Performed at [facility], [place] [address] [place], [place] [address]  Lipid panel     Status: Abnormal   Collection Time: 09/13/21  5:38 AM  Result Value Ref Range   Cholesterol 218 (H) 0 - 200 mg/dL   Triglycerides 1,349 (H) <150 mg/dL    Comment: RESULTS CONFIRMED BY MANUAL DILUTION LIPEMIC SPECIMEN    HDL NOT REPORTED DUE TO HIGH TRIGLYCERIDES >40 mg/dL   Total CHOL/HDL Ratio NOT REPORTED DUE TO HIGH TRIGLYCERIDES RATIO   VLDL UNABLE TO CALCULATE IF TRIGLYCERIDE OVER 400 mg/dL 0 - 40 mg/dL   LDL Cholesterol NOT CALCULATED 0 - 99 mg/dL    Comment: Performed at [facility], [address] [place], [place] [address]  CBC     Status: Abnormal   Collection Time: 09/13/21  5:38 AM  Result Value Ref Range   WBC 10.4 4.0 - 10.5 K/uL   RBC 4.11 (L) 4.22 - 5.81 MIL/uL   Hemoglobin 13.3 13.0  - 17.0 g/dL   HCT 38.0 (L) 39.0 - 52.0 %   MCV 92.5 80.0 - 100.0 fL   MCH 32.4 26.0 - 34.0 pg   MCHC 35.0 30.0 - 36.0 g/dL   RDW 12.3 11.5 - 15.5 %   Platelets 242 150 - 400 K/uL   nRBC 0.0 0.0 - 0.2 %    Comment: Performed at [facility], [address], [place], [place] [address]  Comprehensive metabolic panel     Status: Abnormal   Collection Time: 09/13/21  5:38 AM  Result Value Ref Range   Sodium 132 (L) 135 - 145 mmol/L   Potassium 4.2 3.5 - 5.1 mmol/L   Chloride 101 98 - 111 mmol/L   CO2 24 22 - 32 mmol/L   Glucose, Bld 231 (H) 70 - 99 mg/dL    Comment: Glucose reference range applies only to samples taken after fasting for at least 8 hours.   BUN 33 (H) 8 - 23 mg/dL   Creatinine, Ser 1.05 0.61 - 1.24 mg/dL   Calcium 9.4 8.9 - 10.3 mg/dL   Total Protein 6.6 6.5 - 8.1 g/dL   Albumin 3.7 3.5 - 5.0 g/dL   AST 24 15 - 41 U/L   ALT 38 0 - 44 U/L   Alkaline Phosphatase 85 38 - 126 U/L   Total Bilirubin 0.3 0.3 - 1.2 mg/dL   GFR, Estimated >60 >60 mL/min    Comment: (NOTE) Calculated using the CKD-EPI Creatinine Equation (2021)    Anion gap 7 5 - 15    Comment: Performed at [facility], [address] [place], [place] [address]  Glucose, capillary     Status: Abnormal   Collection Time: 09/13/21  7:23 AM  Result Value Ref Range   Glucose-Capillary 240 (H) 70 - 99 mg/dL    Comment: Glucose reference range applies only to samples taken after fasting for at least 8 hours.  Glucose, capillary     Status: Abnormal   Collection Time: 09/13/21 11:19 AM  Result Value Ref Range   Glucose-Capillary 287 (H) 70 - 99 mg/dL    Comment: Glucose reference range applies only to samples taken after fasting for at least 8 hours.  Glucose, capillary     Status: Abnormal   Collection Time: 09/13/21  4:21 PM  Result Value Ref Range   Glucose-Capillary 362 (H) 70 - 99 mg/dL    Comment: Glucose reference range applies only to samples taken after fasting for  at least 8 hours.     Studies/Results:  BRAIN MRI MRA FINDINGS: MRI HEAD FINDINGS   Brain: Acute nonhemorrhagic infarct is present in the right paramedian pons measuring up to 10 mm. T2 hyperintensities are associated with the acute/subacute infarct. No acute hemorrhage is present. Minimal white matter changes are otherwise within normal limits for age. The ventricles are of normal size. No significant extraaxial fluid collection is present.   Basal ganglia are within normal limits. Cerebellum is unremarkable. The internal auditory canals are within normal limits.   Vascular: Flow is present in the major intracranial arteries.   Skull and upper cervical spine: The craniocervical junction is normal. Upper cervical spine is within normal limits. Marrow signal is unremarkable.   Sinuses/Orbits: Some fluid is present in the inferior right mastoid air cells. No obstructing nasopharyngeal lesion is present. The globes and orbits are within normal limits.   MRA HEAD FINDINGS   Anterior circulation: The internal carotid arteries are within normal limits from the high cervical segments through the ICA termini. The A1 and M1 segments are normal. The anterior communicating artery is patent. ACA and MCA branch vessels are within normal limits.   Posterior circulation: The vertebral arteries are codominant. PICA origins are just below the field of view. PICA arteries appear to be within normal limits. Basilar artery is normal. The right posterior cerebral artery originates from basilar tip. The left posterior cerebral artery is of fetal type. PCA branch vessels are within normal limits.   Anatomic variants: None   IMPRESSION: 1. 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 2. Normal MRA circle-of-Willis without significant proximal stenosis, aneurysm, or branch vessel occlusion.     CAROTID DUPLEX DOPPLER IMPRESSION: 1. Less than 50% stenosis of the internal carotid  arteries. 2. Limited visualization of the right vertebral artery. Antegrade flow seen within the left vertebral artery.      The brain MRI is reviewed in person and shows no acute paramedian infarct involving the right pontine area. No hemorrhages noted. 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 There is mild atrophy appropriate for age.      Niambi Smoak A. Merlene Adams, M.D.  Diplomate, Tax adviser of Psychiatry and Neurology ( Neurology). 09/13/2021, 5:08 PM

## 2021-09-13 NOTE — Progress Notes (Signed)
*  PRELIMINARY RESULTS* Echocardiogram 2D Echocardiogram has been performed.  Bryce Adams 09/13/2021, 4:32 PM

## 2021-09-13 NOTE — Progress Notes (Signed)
Patient ID: Bryce Adams, male   DOB: 03-28-1951, 70 y.o.   MRN: [id number]  PROGRESS NOTE    Bryce Adams  WLN:[id number] DOB: 07-12-1951 DOA: 09/12/2021 PCP: Eber Hong, MD   Brief Narrative:  70 y.o. male, with history of DMII, HLD, HTN, Thyroid disease, tobacco use disorder presented with left-sided weakness worsening for more than a week.  On presentation, CT of the head was negative for acute intracranial abnormality.  Assessment & Plan:   Possible unspecified CVA -Presented with worsening left-sided weakness for more than a week along with some slurring of speech recently. -CT of the head was negative for intracranial acute abnormality.  Follow MRI of brain. -Consult neurology. -PT/OT/SLP evaluation. -2D echo.  Triglycerides 1349; LDL not calculated.  Increase statin to Lipitor 80 mg daily.  Continue aspirin  Acute kidney injury -Presented with creatinine of 1.46.  Improved.  Treated with IV fluids.  ARB and hydrochlorothiazide on hold.  Hyponatremia -Mild.  Monitor  Leukocytosis -Possibly reactive.  Resolved  Diabetes mellitus type 2 with hyperglycemia  -continue CBGs with SSI.  A1c.  Tobacco use disorder -Counseled regarding tobacco cessation  Hypertension -Monitor blood pressure.  ARB and HCTZ on hold for now.   DVT prophylaxis:  Code Status:   Family Communication:  Disposition Plan: Status is: Observation  The patient will require care spanning > 2 midnights and should be moved to inpatient because: Ongoing neurological symptoms.  Pending neurology evaluation and MRI brain.  Consultants: Neurology  Procedures: Echo pending  Antimicrobials: None   Subjective: Patient seen and examined at bedside.  Still complains of left-sided weakness and some slurring of speech.  No overnight fever, vomiting reported.  Objective: Vitals:   09/13/21 0340 09/13/21 0553 09/13/21 0805 09/13/21 1015  BP: (!) 142/86 (!) 144/88 (!) 153/86 139/72  Pulse: 78 75 77 76   Resp: 14 15 15 20   Temp: 98.1 F (36.7 C) 97.6 F (36.4 C)    TempSrc: Oral Oral    SpO2: 98% 97% 99%   Weight:      Height:        Intake/Output Summary (Last 24 hours) at 09/13/2021 1135 Last data filed at 09/13/2021 0900 Gross per 24 hour  Intake 880 ml  Output 1025 ml  Net -145 ml   Filed Weights   09/12/21 1610 09/12/21 2313  Weight: 87.1 kg 85.9 kg    Examination:  General exam: Appears calm and comfortable.  Currently on room air. Respiratory system: Bilateral decreased breath sounds at bases Cardiovascular system: S1 & S2 heard, Rate controlled Gastrointestinal system: Abdomen is nondistended, soft and nontender. Normal bowel sounds heard. Extremities: No cyanosis, clubbing, edema  Central nervous system: Alert and oriented.  Left-sided weakness present.   Skin: No rashes, lesions or ulcers Psychiatry: Affect is mostly flat.   Data Reviewed: I have personally reviewed following labs and imaging studies  CBC: Recent Labs  Lab 09/12/21 1751 09/13/21 0538  WBC 11.0* 10.4  NEUTROABS 7.7  --   HGB 13.8 13.3  HCT 39.8 38.0*  MCV 94.3 92.5  PLT 246 740   Basic Metabolic Panel: Recent Labs  Lab 09/12/21 1751 09/13/21 0538  NA 133* 132*  K 4.8 4.2  CL 100 101  CO2 20* 24  GLUCOSE 186* 231*  BUN 33* 33*  CREATININE 1.46* 1.05  CALCIUM 9.2 9.4   GFR: Estimated Creatinine Clearance: 73.1 mL/min (by C-G formula based on SCr of 1.05 mg/dL). Liver Function Tests: Recent Labs  Lab 09/12/21 1751  09/13/21 0538  AST 34 24  ALT 44 38  ALKPHOS 88 85  BILITOT 0.2* 0.3  PROT 7.1 6.6  ALBUMIN 3.9 3.7   No results for input(s): LIPASE, AMYLASE in the last 168 hours. No results for input(s): AMMONIA in the last 168 hours. Coagulation Profile: Recent Labs  Lab 09/12/21 1751  INR 0.9   Cardiac Enzymes: No results for input(s): CKTOTAL, CKMB, CKMBINDEX, TROPONINI in the last 168 hours. BNP (last 3 results) No results for input(s): PROBNP in the last  8760 hours. HbA1C: No results for input(s): HGBA1C in the last 72 hours. CBG: Recent Labs  Lab 09/12/21 2218 09/13/21 0723 09/13/21 1119  GLUCAP 186* 240* 287*   Lipid Profile: Recent Labs    09/13/21 0538  CHOL 218*  HDL NOT REPORTED DUE TO HIGH TRIGLYCERIDES  LDLCALC NOT CALCULATED  TRIG 1,349*  CHOLHDL NOT REPORTED DUE TO HIGH TRIGLYCERIDES   Thyroid Function Tests: No results for input(s): TSH, T4TOTAL, FREET4, T3FREE, THYROIDAB in the last 72 hours. Anemia Panel: No results for input(s): VITAMINB12, FOLATE, FERRITIN, TIBC, IRON, RETICCTPCT in the last 72 hours. Sepsis Labs: No results for input(s): PROCALCITON, LATICACIDVEN in the last 168 hours.  Recent Results (from the past 240 hour(s))  Resp Panel by RT-PCR (Flu A&B, Covid) Nasopharyngeal Swab     Status: None   Collection Time: 09/12/21  7:37 PM   Specimen: Nasopharyngeal Swab; Nasopharyngeal(NP) swabs in vial transport medium  Result Value Ref Range Status   SARS Coronavirus 2 by RT PCR NEGATIVE NEGATIVE Final    Comment: (NOTE) SARS-CoV-2 target nucleic acids are NOT DETECTED.  The SARS-CoV-2 RNA is generally detectable in upper respiratory specimens during the acute phase of infection. The lowest concentration of SARS-CoV-2 viral copies this assay can detect is 138 copies/mL. A negative result does not preclude SARS-Cov-2 infection and should not be used as the sole basis for treatment or other patient management decisions. A negative result may occur with  improper specimen collection/handling, submission of specimen other than nasopharyngeal swab, presence of viral mutation(s) within the areas targeted by this assay, and inadequate number of viral copies(<138 copies/mL). A negative result must be combined with clinical observations, patient history, and epidemiological information. The expected result is Negative.  Fact Sheet for Patients:  EntrepreneurPulse.com.au  Fact Sheet for  Healthcare Providers:  IncredibleEmployment.be  This test is no t yet approved or cleared by the [place] FDA and  has been authorized for detection and/or diagnosis of SARS-CoV-2 by FDA under an Emergency Use Authorization (EUA). This EUA will remain  in effect (meaning this test can be used) for the duration of the COVID-19 declaration under Section 564(b)(1) of the Act, 21 U.S.C.section 360bbb-3(b)(1), unless the authorization is terminated  or revoked sooner.       Influenza A by PCR NEGATIVE NEGATIVE Final   Influenza B by PCR NEGATIVE NEGATIVE Final    Comment: (NOTE) The Xpert Xpress SARS-CoV-2/FLU/RSV plus assay is intended as an aid in the diagnosis of influenza from Nasopharyngeal swab specimens and should not be used as a sole basis for treatment. Nasal washings and aspirates are unacceptable for Xpert Xpress SARS-CoV-2/FLU/RSV testing.  Fact Sheet for Patients: EntrepreneurPulse.com.au  Fact Sheet for Healthcare Providers: IncredibleEmployment.be  This test is not yet approved or cleared by the [place] FDA and has been authorized for detection and/or diagnosis of SARS-CoV-2 by FDA under an Emergency Use Authorization (EUA). This EUA will remain in effect (meaning this test can be used)  for the duration of the COVID-19 declaration under Section 564(b)(1) of the Act, 21 U.S.C. section 360bbb-3(b)(1), unless the authorization is terminated or revoked.  Performed at [facility], [address] [place], [place] [address]          Radiology Studies: CT HEAD WO CONTRAST  Result Date: 09/12/2021 CLINICAL DATA:  Neuro deficit, acute, stroke suspected EXAM: CT HEAD WITHOUT CONTRAST TECHNIQUE: Contiguous axial images were obtained from the base of the skull through the vertex without intravenous contrast. COMPARISON:  None. FINDINGS: Brain: No evidence of acute intracranial hemorrhage or extra-axial  collection.No evidence of mass lesion/concern mass effect.The ventricles are normal in size. Vascular: No hyperdense vessel or unexpected calcification. Skull: Normal. Negative for fracture or focal lesion. 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 Sinuses/Orbits: No acute finding. Other: None. IMPRESSION: No acute intracranial abnormality. Electronically Signed   By: Maurine Simmering M.D.   On: 09/12/2021 18:37   MR ANGIO HEAD WO CONTRAST  Result Date: 09/13/2021 CLINICAL DATA:  Near syncope. Slurred speech. Possible left facial droop. Symptoms began yesterday. EXAM: MRI HEAD WITHOUT CONTRAST MRA HEAD WITHOUT CONTRAST TECHNIQUE: Multiplanar, multi-echo pulse sequences of the brain and surrounding structures were acquired without intravenous contrast. Angiographic images of the Circle of Willis were acquired using MRA technique without intravenous contrast. COMPARISON:  CT head without contrast 09/12/2021 FINDINGS: MRI HEAD FINDINGS Brain: Acute nonhemorrhagic infarct is present in the right paramedian pons measuring up to 10 mm. T2 hyperintensities are associated with the acute/subacute infarct. No acute hemorrhage is present. Minimal white matter changes are otherwise within normal limits for age. The ventricles are of normal size. No significant extraaxial fluid collection is present. Basal ganglia are within normal limits. Cerebellum is unremarkable. The internal auditory canals are within normal limits. Vascular: Flow is present in the major intracranial arteries. Skull and upper cervical spine: The craniocervical junction is normal. Upper cervical spine is within normal limits. Marrow signal is unremarkable. Sinuses/Orbits: Some fluid is present in the inferior right mastoid air cells. No obstructing nasopharyngeal lesion is present. The globes and orbits are within normal limits. MRA HEAD FINDINGS Anterior circulation: The internal carotid arteries are within  normal limits from the high cervical segments through the ICA termini. The A1 and M1 segments are normal. The anterior communicating artery is patent. ACA and MCA branch vessels are within normal limits. Posterior circulation: The vertebral arteries are codominant. PICA origins are just below the field of view. PICA arteries appear to be within normal limits. Basilar artery is normal. The right posterior cerebral artery originates from basilar tip. The left posterior cerebral artery is of fetal type. PCA branch vessels are within normal limits. Anatomic variants: None IMPRESSION: 1. 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 2. Normal MRA circle-of-Willis without significant proximal stenosis, aneurysm, or branch vessel occlusion. These results were called by telephone at the time of interpretation on 09/13/2021 at 10:13 am to provider [facility] , who verbally acknowledged these results. Electronically Signed   By: San Morelle M.D.   On: 09/13/2021 10:30   MR BRAIN WO CONTRAST  Result Date: 09/13/2021 CLINICAL DATA:  Near syncope. Slurred speech. Possible left facial droop. Symptoms began yesterday. EXAM: MRI HEAD WITHOUT CONTRAST MRA HEAD WITHOUT CONTRAST TECHNIQUE: Multiplanar, multi-echo pulse sequences of the brain and surrounding structures were acquired without intravenous contrast. Angiographic images of the Circle of Willis were acquired using MRA technique without intravenous contrast. COMPARISON:  CT head without contrast  09/12/2021 FINDINGS: MRI HEAD FINDINGS Brain: Acute nonhemorrhagic infarct is present in the right paramedian pons measuring up to 10 mm. T2 hyperintensities are associated with the acute/subacute infarct. No acute hemorrhage is present. Minimal white matter changes are otherwise within normal limits for age. The ventricles are of normal size. No significant extraaxial fluid collection is present. Basal ganglia are within normal limits.  Cerebellum is unremarkable. The internal auditory canals are within normal limits. Vascular: Flow is present in the major intracranial arteries. Skull and upper cervical spine: The craniocervical junction is normal. Upper cervical spine is within normal limits. Marrow signal is unremarkable. Sinuses/Orbits: Some fluid is present in the inferior right mastoid air cells. No obstructing nasopharyngeal lesion is present. The globes and orbits are within normal limits. MRA HEAD FINDINGS Anterior circulation: The internal carotid arteries are within normal limits from the high cervical segments through the ICA termini. The A1 and M1 segments are normal. The anterior communicating artery is patent. ACA and MCA branch vessels are within normal limits. Posterior circulation: The vertebral arteries are codominant. PICA origins are just below the field of view. PICA arteries appear to be within normal limits. Basilar artery is normal. The right posterior cerebral artery originates from basilar tip. The left posterior cerebral artery is of fetal type. PCA branch vessels are within normal limits. Anatomic variants: None IMPRESSION: 1. 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 2. Normal MRA circle-of-Willis without significant proximal stenosis, aneurysm, or branch vessel occlusion. These results were called by telephone at the time of interpretation on 09/13/2021 at 10:13 am to provider [facility] , who verbally acknowledged these results. Electronically Signed   By: San Morelle M.D.   On: 09/13/2021 10:30   US Carotid Bilateral  Result Date: 09/13/2021 CLINICAL DATA:  Left-sided weakness CVA Hypertension Stroke Hyperlipidemia Diabetes Smoker EXAM: BILATERAL CAROTID DUPLEX ULTRASOUND TECHNIQUE: Pearline Cables scale imaging, color Doppler and duplex ultrasound were performed of bilateral carotid and vertebral arteries in the neck. COMPARISON:  None. FINDINGS: Criteria: Quantification of carotid  stenosis is based on velocity parameters that correlate the residual internal carotid diameter with NASCET-based stenosis levels, using the diameter of the distal internal carotid lumen as the denominator for stenosis measurement. The following velocity measurements were obtained: RIGHT ICA: 85/26 cm/sec CCA: 16/10 cm/sec SYSTOLIC ICA/CCA RATIO:  1.2 ECA: 61 cm/sec LEFT ICA: 75/23 cm/sec CCA: 96/04 cm/sec SYSTOLIC ICA/CCA RATIO:  1.2 ECA: 83 cm/sec RIGHT CAROTID ARTERY: Mild heterogeneous plaque of the carotid bifurcation. RIGHT VERTEBRAL ARTERY:  Limited visualization. LEFT CAROTID ARTERY: Mild heterogeneous plaque of the carotid bifurcation. LEFT VERTEBRAL ARTERY:  Antegrade flow. IMPRESSION: 1. Less than 50% stenosis of the internal carotid arteries. 2. Limited visualization of the right vertebral artery. Antegrade flow seen within the left vertebral artery. Electronically Signed   By: Miachel Roux M.D.   On: 09/13/2021 11:30        Scheduled Meds:  aspirin EC  81 mg Oral Daily   atorvastatin  20 mg Oral q morning   famotidine  20 mg Oral Daily   heparin  5,000 Units Subcutaneous Q8H   [START ON 09/14/2021] influenza vaccine adjuvanted  0.5 mL Intramuscular Tomorrow-1000   insulin aspart  0-15 Units Subcutaneous TID WC   insulin aspart  0-5 Units Subcutaneous QHS   levothyroxine  50 mcg Oral q morning   LORazepam  1 mg Intravenous Once   nicotine  35 mg Transdermal Daily   Continuous Infusions:  Aline August, MD [facility] 09/13/2021, 11:35 AM

## 2021-09-13 NOTE — Progress Notes (Signed)
Patient is refusing to cooperate with nurse to do his NIH stroke assessment, patient is alert and oriented, nurse asked patient if she needed to come back at a better time, and he stated "I want y'all to leave me alone so I can get some damn sleep". Charge nurse Bree notfied, Dr. Mertie Clause notified.

## 2021-09-13 NOTE — Evaluation (Signed)
Speech Language Pathology Evaluation Patient Details Name: Bryce Adams MRN: [id number] DOB: 1951-05-07 Today's Date: 09/13/2021 Time: 5465-6812 SLP Time Calculation (min) (ACUTE ONLY): 20 min  Problem List:  Patient Active Problem List   Diagnosis Date Noted   Stroke ([place]) 09/12/2021   Deviated nasal septum 09/12/2021   Hyperlipidemia, mixed 09/12/2021   Tobacco use disorder 09/12/2021   Anemia, deficiency 09/09/2021   Low magnesium level 09/09/2021   Adenomatous polyp of transverse colon 10/20/2019   Sigmoid diverticulosis 10/20/2019   Stage 3a chronic kidney disease ([place]) 04/18/2019   Pain in right knee 03/27/2019   Family history of colon cancer 01/15/2017   Acquired hypothyroidism 01/01/2015   DMII (diabetes mellitus, type 2) ([place]) 01/01/2015   Encounter for long-term (current) use of medications 06/15/2014   Vitamin D deficiency 12/10/2008   Nonspecific abnormal results of liver function study 12/08/2008   Obesity 12/19/2006   Bundle branch block, 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 male, with history of DMII, HLD, HTN, Thyroid disease, tobacco use disorder, renal disorder and more presents to the ED with a chief complaint of left sided weakness. Patient complained of near syncope, slurred speech, and wife thinks she saw left facial droop as well. 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 Oral mech reveals mild left oral droop with decreased left labial strength resulting in slightly decreased articulatory precision with speech. Speech intelligibility is only slightly decreased (80-100% intelligible) at the conversation level, however. SLP reviewed strategies for dysarthria including slowed speech rate and over articulation; Pt was stimulable for strategies. Strategies reviewed and discussed with Pt and wife, there is no need for ST f/u. ST will sign off. Thank you.    SLP Assessment  SLP Recommendation/Assessment: Patient does not need any further Speech [place] [facility] SLP Visit Diagnosis: Dysarthria and anarthria (R47.1)    Recommendations for follow up therapy are one component of a multi-disciplinary discharge planning process, led by the attending physician.  Recommendations may be updated based on patient status, additional functional criteria and insurance authorization.             SLP Evaluation Cognition  Overall Cognitive Status: Within Functional Limits for tasks assessed Orientation Level: Oriented X4       Comprehension  Auditory Comprehension Overall Auditory Comprehension: Appears within functional limits for tasks assessed Visual Recognition/Discrimination Discrimination: Within Function Limits Reading Comprehension Reading Status: Not tested    Expression Verbal Expression Overall Verbal Expression: Appears within functional limits for tasks assessed Written Expression Dominant Hand: Right   Oral / Motor  Oral Motor/Sensory Function Overall Oral Motor/Sensory Function: Within functional limits Motor Speech Overall Motor Speech: Impaired Articulation: Impaired Level of Impairment: Conversation Intelligibility: Intelligibility reduced Word: 75-100%  accurate  Ivery Nanney H. Roddie Mc, CCC-SLP Speech Language Pathologist          Wende Bushy 09/13/2021, 11:15 AM

## 2021-09-13 NOTE — Evaluation (Signed)
Physical Therapy Evaluation Only Patient Details Name: Bryce Adams MRN: [id number] DOB: 02-07-1951 Today's Date: 09/13/2021  History of Present Illness  Bryce Adams is a 70 y.o. male wiht c/o L sided weakness. 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 Pt currently demonstrates slight LLE weakness compared to RLE and mild difficulty performing heel to shin test with less fluid movement. Difficulty noted with head turns while ambulating, sudden stop, tandem stance and NBOS stance, no complete LOB or physical assist to recover noted. Pt with decreased LUE arm swing while ambulating and deficits with L hip swing, reporting L knee buckling sensation and "I don't trust it"- no buckling noted while ambulating. Pt would benefit from OPPT for high level strengthening and balance exercises to progress back to baseline; pt and spouse in agreement. No acute care needs identified, will sign off at this time.     Recommendations for follow up therapy are one component of a multi-disciplinary discharge planning process, led by the attending physician.  Recommendations may be updated based on patient status, additional functional criteria and insurance authorization.  Follow Up Recommendations Outpatient PT    Equipment Recommendations  None recommended by PT    Recommendations for Other Services       Precautions / Restrictions Precautions Precautions: Fall Restrictions Weight Bearing Restrictions: No      Mobility  Bed Mobility Overal bed mobility: Independent   Transfers Overall transfer level: Modified independent  General transfer comment: no balance deficits, slightly hesitant of L knee  buckling  Ambulation/Gait Ambulation/Gait assistance: Modified independent (Device/Increase time);Supervision Gait Distance (Feet): 250 Feet Assistive device: None Gait Pattern/deviations: Step-through pattern;Decreased stride length;Decreased stance time - left Gait velocity: WFL   General Gait Details: some L hip swing deficits without LOB, quick off-weighting of LLE, pt reports knee buckling sensation without occuranced noted, decreased LUE arm swing, no LOB; supv with head turns up/down and R/L and sudden stop due to slightly unsteady without LOB or physical assistance  Stairs            Wheelchair Mobility    Modified Rankin (Stroke Patients Only)       Balance Overall balance assessment: Mild deficits observed, not formally tested Sitting-balance support: No upper extremity supported;Feet supported Sitting balance-Leahy Scale: Normal Sitting balance - Comments: seated at EOB   Standing balance support: No upper extremity supported;During functional activity Standing balance-Leahy Scale: Good Standing balance comment: mild L lateral lean with LOB to L with tandem stance (requires HHA to assume NBOS stance, able to maintain with close supv for ~20 sec; LOB L with tandem stance)        Pertinent Vitals/Pain Pain Assessment: No/denies pain    Home Living Family/patient expects to be discharged to:: Private residence Living Arrangements: Spouse/significant other Available Help at Discharge: Family;Available 24 hours/day Type of Home: House Home Access: Level entry     Home Layout: One level Home Equipment: None      Prior Function Level of Independence: Independent  Comments: Pt report independent with community ambulation, ADLs/IADLs, drives. 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     Hand Dominance   Dominant Hand: Right    Extremity/Trunk Assessment   Upper Extremity Assessment Upper Extremity Assessment: Defer to OT evaluation LUE Deficits /  Details: 4+/5 grossly with expection of elbow extension at 4/5. Mild deficits compared to 5/5 for R UE. LUE Sensation: WNL LUE Coordination: WNL    Lower Extremity Assessment Lower Extremity Assessment: LLE deficits/detail;RLE deficits/detail RLE Deficits / Details: AROM WNL, 5/5 throughout RLE Sensation: WNL RLE Coordination: WNL LLE Deficits / Details: ankle 5/5, knee flexion 5/5, knee extension 4+/5, hip flexion 4+/5, hip abduction 4+/5, hip adduction 4+/5; mild difficulty with heel to shin test, but able to complete LLE Sensation: WNL;history of peripheral neuropathy LLE Coordination: WNL    Cervical / Trunk Assessment Cervical / Trunk Assessment: Normal  Communication   Communication: No difficulties  Cognition Arousal/Alertness: Awake/alert Behavior During Therapy: WFL for tasks assessed/performed Overall Cognitive Status: Within Functional Limits for tasks assessed     General Comments      Exercises     Assessment/Plan    PT Assessment All further PT needs can be met in the next venue of care  PT Problem List Decreased balance       PT Treatment Interventions      PT Goals (Current goals can be found in the Care Plan section)  Acute Rehab PT Goals Patient Stated Goal: home, open to OPPT PT Goal Formulation: All assessment and education complete, DC therapy    Frequency     Barriers to discharge        Co-evaluation PT/OT/SLP Co-Evaluation/Treatment: Yes Reason for Co-Treatment: To address functional/ADL transfers PT goals addressed during session: Mobility/safety with mobility;Balance OT goals addressed during session: ADL's and self-care       AM-PAC PT "6 Clicks" Mobility  Outcome Measure Help needed turning from your back to your side while in a flat bed without using bedrails?: None Help needed moving from lying on your back to sitting on the side of a flat bed without using bedrails?: None Help needed moving to and from a bed to a chair  (including a wheelchair)?: None Help needed standing up from a chair using your arms (e.g., wheelchair or bedside chair)?: None Help needed to walk in hospital room?: A Little Help needed climbing 3-5 steps with a railing? : A Little 6 Click Score: 22    End of Session Equipment Utilized During Treatment: Gait belt Activity Tolerance: Patient tolerated treatment well Patient left: in chair;with call bell/phone within reach;with family/visitor present Nurse Communication: Mobility status PT Visit Diagnosis: Difficulty in walking, not elsewhere classified (R26.2);Other symptoms and signs involving the nervous system (J88.416)    Time: 6063-0160 PT Time Calculation (min) (ACUTE ONLY): 31 min   Charges:   PT Evaluation $PT Eval Low Complexity: 1 Low           Tori Michaline Kindig PT, DPT 09/13/21, 12:16 PM

## 2021-09-14 DIAGNOSIS — I639 Cerebral infarction, unspecified: Secondary | ICD-10-CM | POA: Diagnosis not present

## 2021-09-14 LAB — CBC WITH DIFFERENTIAL/PLATELET
Abs Immature Granulocytes: 0.18 10*3/uL — ABNORMAL HIGH (ref 0.00–0.07)
Basophils Absolute: 0.1 10*3/uL (ref 0.0–0.1)
Basophils Relative: 1 %
Eosinophils Absolute: 0.3 10*3/uL (ref 0.0–0.5)
Eosinophils Relative: 3 %
HCT: 39.5 % (ref 39.0–52.0)
Hemoglobin: 13.7 g/dL (ref 13.0–17.0)
Immature Granulocytes: 2 %
Lymphocytes Relative: 17 %
Lymphs Abs: 1.7 10*3/uL (ref 0.7–4.0)
MCH: 31.9 pg (ref 26.0–34.0)
MCHC: 34.7 g/dL (ref 30.0–36.0)
MCV: 92.1 fL (ref 80.0–100.0)
Monocytes Absolute: 0.9 10*3/uL (ref 0.1–1.0)
Monocytes Relative: 9 %
Neutro Abs: 6.9 10*3/uL (ref 1.7–7.7)
Neutrophils Relative %: 68 %
Platelets: 236 10*3/uL (ref 150–400)
RBC: 4.29 MIL/uL (ref 4.22–5.81)
RDW: 12.5 % (ref 11.5–15.5)
WBC: 10 10*3/uL (ref 4.0–10.5)
nRBC: 0 % (ref 0.0–0.2)

## 2021-09-14 LAB — MAGNESIUM: Magnesium: 2.1 mg/dL (ref 1.7–2.4)

## 2021-09-14 LAB — GLUCOSE, CAPILLARY: Glucose-Capillary: 256 mg/dL — ABNORMAL HIGH (ref 70–99)

## 2021-09-14 LAB — BASIC METABOLIC PANEL
Anion gap: 7 (ref 5–15)
BUN: 31 mg/dL — ABNORMAL HIGH (ref 8–23)
CO2: 23 mmol/L (ref 22–32)
Calcium: 9.1 mg/dL (ref 8.9–10.3)
Chloride: 103 mmol/L (ref 98–111)
Creatinine, Ser: 1.07 mg/dL (ref 0.61–1.24)
GFR, Estimated: >60 mL/min (ref >60)
Glucose, Bld: 220 mg/dL — ABNORMAL HIGH (ref 70–99)
Potassium: 4.3 mmol/L (ref 3.5–5.1)
Sodium: 133 mmol/L — ABNORMAL LOW (ref 135–145)

## 2021-09-14 MED ORDER — CLOPIDOGREL BISULFATE 75 MG PO TABS: 75.0000 mg | ORAL_TABLET | Freq: Every day | ORAL | 11 refills | Status: AC

## 2021-09-14 MED ORDER — ASPIRIN 81 MG PO TBEC: 81.0000 mg | DELAYED_RELEASE_TABLET | Freq: Every day | ORAL | 0 refills | Status: AC

## 2021-09-14 NOTE — Progress Notes (Signed)
Nsg Discharge Note  Admit Date:  09/12/2021 Discharge date: 09/14/2021   Artrell Lawless to be D/C'd Home per MD order.  AVS completed.  Copy for chart, and copy for patient signed, and dated. Patient/caregiver able to verbalize understanding.  Discharge Medication: Allergies as of 09/14/2021   No Known Allergies      Medication List     STOP taking these medications    cefdinir 300 MG capsule Commonly known as: OMNICEF       TAKE these medications    aspirin 81 MG EC tablet Take 1 tablet (81 mg total) by mouth daily. Swallow whole. Start taking on: September 15, 2021 What changed: additional instructions   atorvastatin 20 MG tablet Commonly known as: LIPITOR Take 20 mg by mouth every morning.   clopidogrel 75 MG tablet Commonly known as: Plavix Take 1 tablet (75 mg total) by mouth daily.   empagliflozin 10 MG Tabs tablet Commonly known as: JARDIANCE Take by mouth.   famotidine 20 MG tablet Commonly known as: PEPCID Take 20 mg by mouth daily.   gabapentin 300 MG capsule Commonly known as: NEURONTIN TK 1 C PO BID   hydrochlorothiazide 25 MG tablet Commonly known as: HYDRODIURIL Take 25 mg by mouth every morning.   irbesartan 300 MG tablet Commonly known as: AVAPRO Take 1 tablet by mouth daily.   levothyroxine 50 MCG tablet Commonly known as: SYNTHROID Take 50 mcg by mouth every morning.   Magnesium Oxide 400 MG Caps Take 1 capsule by mouth 2 (two) times daily.   metFORMIN 1000 MG tablet Commonly known as: GLUCOPHAGE Take 1,000 mg by mouth 2 (two) times daily with a meal.   multivitamin with minerals Tabs tablet Take 1 tablet by mouth daily.   omega-3 acid ethyl esters 1 g capsule Commonly known as: LOVAZA Take 2 g by mouth 2 (two) times daily.   sitaGLIPtin 100 MG tablet Commonly known as: JANUVIA Take 100 mg by mouth daily.   valsartan 160 MG tablet Commonly known as: DIOVAN Take 160 mg by mouth daily.   Victoza 18 MG/3ML Sopn Generic  drug: liraglutide Inject 1.8 mg into the skin every evening.   Vitamin D (Ergocalciferol) 1.25 MG (50000 UNIT) Caps capsule Commonly known as: DRISDOL Take 50,000 Units by mouth every 7 (seven) days. fridays        Discharge Assessment: Vitals:   09/14/21 0352 09/14/21 0831  BP: 140/66 125/74  Pulse: 69 74  Resp: 20 20  Temp: 98.5 F (36.9 C)   SpO2: 95% 96%   Skin clean, dry and intact without evidence of skin break down, no evidence of skin tears noted. IV catheter discontinued intact. Site without signs and symptoms of complications - no redness or edema noted at insertion site, patient denies c/o pain - only slight tenderness at site.  Dressing with slight pressure applied.  D/c Instructions-Education: Discharge instructions given to patient/family with verbalized understanding. D/c education completed with patient/family including follow up instructions, medication list, d/c activities limitations if indicated, with other d/c instructions as indicated by MD - patient able to verbalize understanding, all questions fully answered. Patient instructed to return to ED, call 911, or call MD for any changes in condition.  Patient escorted via [place], and D/C home via private auto.  Clovis Fredrickson, LPN [id number] 42:68 AM

## 2021-09-14 NOTE — Progress Notes (Signed)
Inpatient Diabetes Program Recommendations  AACE/ADA: New Consensus Statement on Inpatient Glycemic Control   Target Ranges:  Prepandial:   less than 140 mg/dL      Peak postprandial:   less than 180 mg/dL (1-2 hours)      Critically ill patients:  140 - 180 mg/dL   Results for Bryce Adams, Bryce Adams (MRN [id number]) as of 09/14/2021 07:51  Ref. Range 09/13/2021 07:23 09/13/2021 11:19 09/13/2021 16:21 09/13/2021 20:29 09/14/2021 07:41  Glucose-Capillary Latest Ref Range: 70 - 99 mg/dL 240 (H) 287 (H) 362 (H) 223 (H) 256 (H)    Review of Glycemic Control  Diabetes history: DM2 Outpatient Diabetes medications: Metformin 1000 mg BID, Januvia 100 mg daily, Jardiance 10 mg daily, Victoza 1.8 mg QPM Current orders for Inpatient glycemic control: Novolog 0-15 units TID with meals, Novolog 0-5 units QHS  Inpatient Diabetes Program Recommendations:    Insulin: Please consider ordering Semglee 10 units Q24H and Novolog 3 units TID with meals for meal coverage if patient eats at least 50% of meals.   Thanks, Barnie Alderman, RN, MSN, CDE Diabetes Coordinator Inpatient Diabetes Program [phone] (Team Pager from 8am to 5pm)

## 2021-09-14 NOTE — Discharge Summary (Signed)
Physician Discharge Summary  Bryce Adams HKV:[id number] DOB: 1951/06/23 DOA: 09/12/2021  PCP: Eber Hong, MD  Admit date: 09/12/2021 Discharge date: 09/14/2021  Admitted From: Emergency department Disposition: Home  Recommendations for Outpatient Follow-up:  Follow up with PCP in 1 weeks Please obtain BMP/CBC in one week Patient will need to follow-up up with neurology in 2 months. Continue on dual antiplatelet therapy with aspirin and Plavix x4 weeks and then Plavix only thereafter Continue statin as prior  Home Health:  Discharge Condition: Fair  CODE STATUS: Full   Brief Hospitalization Summary: Bryce Adams  is a 70 y.o. male, with history of DMII, HLD, HTN, Thyroid disease, tobacco use disorder, renal disorder and more presents to the ED with a chief complaint of left sided weakness. Per Wife's reports that patient has had left-sided weakness for about a week.  They went to the PCP and got blood work done.  He had hypomagnesemia, and then was found to have CKD, CHF, UTI according to the wife.  He was started on Jardiance and antibiotic as well as magnesium supplement.  Patient's weakness worsened.  He was instructed to come to the ED by his primary care provider.  While in the ED, patient received head CT  that showed no intercranial abnormalities.  On physical exam patient had no neurological deficits.  Neurology was consulted.  Patient was also found to have an elevated creatinine of 1.46 during this admission; that is up from 0.08 in March.  Patient does state that he has not been feeling well AKI likely due to poor p.o. intake.  Patient was admitted to the floor.  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  Echocardiogram completed on patient.  It showed an ejection fraction of 60 to 65% with mild left ventricular hypertrophy, mild mitral valve regurgitation, moderate mitral annular calcifications, and mild aortic valve stenosis.   Carotid ultrasound showed less than 50% stenosis of the internal carotid arteries.   Neurology has seen patient.  They recommend patient receive dual antiplatelet therapy for 1 month with aspirin and Plavix.  They also report recommended continued blood pressure control, blood sugar management and smoking since cessation.   Patient's creatinine did improve throughout admission.  On 10/19, creatinine of 1.07.  Please see all hospital notes, images, labs for full details of the hospitalization.  Discharge Diagnoses:  Active Problems:   Stroke [facility])   Acquired hypothyroidism   DMII (diabetes mellitus, type 2) ([place])   Tobacco use disorder  Principal discharge diagnosis: Acute right paramedian pons CVA.  Discharge Instructions: Discharge Instructions     Diet - low sodium heart healthy   Complete by: As directed    Increase activity slowly   Complete by: As directed       Allergies as of 09/14/2021   No Known Allergies      Medication List     STOP taking these medications    cefdinir 300 MG capsule Commonly known as: OMNICEF       TAKE these medications    aspirin 81 MG EC tablet Take 1 tablet (81 mg total) by mouth daily. Swallow whole. Start taking on: September 15, 2021 What changed: additional instructions   atorvastatin 20 MG tablet Commonly known as: LIPITOR Take 20 mg by mouth every morning.   clopidogrel 75 MG tablet Commonly known as: Plavix Take 1 tablet (75 mg total) by mouth daily.   empagliflozin 10 MG Tabs tablet Commonly known as: JARDIANCE Take by mouth.  famotidine 20 MG tablet Commonly known as: PEPCID Take 20 mg by mouth daily.   gabapentin 300 MG capsule Commonly known as: NEURONTIN TK 1 C PO BID   hydrochlorothiazide 25 MG tablet Commonly known as: HYDRODIURIL Take 25 mg by mouth every morning.   irbesartan 300 MG tablet Commonly known as: AVAPRO Take 1 tablet by mouth daily.   levothyroxine 50 MCG tablet Commonly known  as: SYNTHROID Take 50 mcg by mouth every morning.   Magnesium Oxide 400 MG Caps Take 1 capsule by mouth 2 (two) times daily.   metFORMIN 1000 MG tablet Commonly known as: GLUCOPHAGE Take 1,000 mg by mouth 2 (two) times daily with a meal.   multivitamin with minerals Tabs tablet Take 1 tablet by mouth daily.   omega-3 acid ethyl esters 1 g capsule Commonly known as: LOVAZA Take 2 g by mouth 2 (two) times daily.   sitaGLIPtin 100 MG tablet Commonly known as: JANUVIA Take 100 mg by mouth daily.   valsartan 160 MG tablet Commonly known as: DIOVAN Take 160 mg by mouth daily.   Victoza 18 MG/3ML Sopn Generic drug: liraglutide Inject 1.8 mg into the skin every evening.   Vitamin D (Ergocalciferol) 1.25 MG (50000 UNIT) Caps capsule Commonly known as: DRISDOL Take 50,000 Units by mouth every 7 (seven) days. fridays        Follow-up Information     Eber Hong, MD. Schedule an appointment as soon as possible for a visit.   Specialty: Internal Medicine Contact information: [address] [place] [address] [phone]         Phillips Odor, MD. Schedule an appointment as soon as possible for a visit in 2 month(s).   Specialty: Neurology Contact information: [place] [address] [phone]                No Known Allergies Allergies as of 09/14/2021   No Known Allergies      Medication List     STOP taking these medications    cefdinir 300 MG capsule Commonly known as: OMNICEF       TAKE these medications    aspirin 81 MG EC tablet Take 1 tablet (81 mg total) by mouth daily. Swallow whole. Start taking on: September 15, 2021 What changed: additional instructions   atorvastatin 20 MG tablet Commonly known as: LIPITOR Take 20 mg by mouth every morning.   clopidogrel 75 MG tablet Commonly known as: Plavix Take 1 tablet (75 mg total) by mouth daily.   empagliflozin 10 MG Tabs tablet Commonly known as: JARDIANCE Take by  mouth.   famotidine 20 MG tablet Commonly known as: PEPCID Take 20 mg by mouth daily.   gabapentin 300 MG capsule Commonly known as: NEURONTIN TK 1 C PO BID   hydrochlorothiazide 25 MG tablet Commonly known as: HYDRODIURIL Take 25 mg by mouth every morning.   irbesartan 300 MG tablet Commonly known as: AVAPRO Take 1 tablet by mouth daily.   levothyroxine 50 MCG tablet Commonly known as: SYNTHROID Take 50 mcg by mouth every morning.   Magnesium Oxide 400 MG Caps Take 1 capsule by mouth 2 (two) times daily.   metFORMIN 1000 MG tablet Commonly known as: GLUCOPHAGE Take 1,000 mg by mouth 2 (two) times daily with a meal.   multivitamin with minerals Tabs tablet Take 1 tablet by mouth daily.   omega-3 acid ethyl esters 1 g capsule Commonly known as: LOVAZA Take 2 g by mouth 2 (two) times daily.  sitaGLIPtin 100 MG tablet Commonly known as: JANUVIA Take 100 mg by mouth daily.   valsartan 160 MG tablet Commonly known as: DIOVAN Take 160 mg by mouth daily.   Victoza 18 MG/3ML Sopn Generic drug: liraglutide Inject 1.8 mg into the skin every evening.   Vitamin D (Ergocalciferol) 1.25 MG (50000 UNIT) Caps capsule Commonly known as: DRISDOL Take 50,000 Units by mouth every 7 (seven) days. fridays        Procedures/Studies: CT HEAD WO CONTRAST  Result Date: 09/12/2021 CLINICAL DATA:  Neuro deficit, acute, stroke suspected EXAM: CT HEAD WITHOUT CONTRAST TECHNIQUE: Contiguous axial images were obtained from the base of the skull through the vertex without intravenous contrast. COMPARISON:  None. FINDINGS: Brain: No evidence of acute intracranial hemorrhage or extra-axial collection.No evidence of mass lesion/concern mass effect.The ventricles are normal in size. Vascular: No hyperdense vessel or unexpected calcification. Skull: Normal. Negative for fracture or focal lesion. 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  Sinuses/Orbits: No acute finding. Other: None. IMPRESSION: No acute intracranial abnormality. Electronically Signed   By: Maurine Simmering M.D.   On: 09/12/2021 18:37   MR ANGIO HEAD WO CONTRAST  Result Date: 09/13/2021 CLINICAL DATA:  Near syncope. Slurred speech. Possible left facial droop. Symptoms began yesterday. EXAM: MRI HEAD WITHOUT CONTRAST MRA HEAD WITHOUT CONTRAST TECHNIQUE: Multiplanar, multi-echo pulse sequences of the brain and surrounding structures were acquired without intravenous contrast. Angiographic images of the Circle of Willis were acquired using MRA technique without intravenous contrast. COMPARISON:  CT head without contrast 09/12/2021 FINDINGS: MRI HEAD FINDINGS Brain: Acute nonhemorrhagic infarct is present in the right paramedian pons measuring up to 10 mm. T2 hyperintensities are associated with the acute/subacute infarct. No acute hemorrhage is present. Minimal white matter changes are otherwise within normal limits for age. The ventricles are of normal size. No significant extraaxial fluid collection is present. Basal ganglia are within normal limits. Cerebellum is unremarkable. The internal auditory canals are within normal limits. Vascular: Flow is present in the major intracranial arteries. Skull and upper cervical spine: The craniocervical junction is normal. Upper cervical spine is within normal limits. Marrow signal is unremarkable. Sinuses/Orbits: Some fluid is present in the inferior right mastoid air cells. No obstructing nasopharyngeal lesion is present. The globes and orbits are within normal limits. MRA HEAD FINDINGS Anterior circulation: The internal carotid arteries are within normal limits from the high cervical segments through the ICA termini. The A1 and M1 segments are normal. The anterior communicating artery is patent. ACA and MCA branch vessels are within normal limits. Posterior circulation: The vertebral arteries are codominant. PICA origins are just below the  field of view. PICA arteries appear to be within normal limits. Basilar artery is normal. The right posterior cerebral artery originates from basilar tip. The left posterior cerebral artery is of fetal type. PCA branch vessels are within normal limits. Anatomic variants: None IMPRESSION: 1. 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 2. Normal MRA circle-of-Willis without significant proximal stenosis, aneurysm, or branch vessel occlusion. These results were called by telephone at the time of interpretation on 09/13/2021 at 10:13 am to provider [facility] , who verbally acknowledged these results. Electronically Signed   By: San Morelle M.D.   On: 09/13/2021 10:30   MR BRAIN WO CONTRAST  Result Date: 09/13/2021 CLINICAL DATA:  Near syncope. Slurred speech. Possible left facial droop. Symptoms began yesterday. EXAM: MRI HEAD WITHOUT CONTRAST MRA HEAD WITHOUT CONTRAST TECHNIQUE: Multiplanar, multi-echo pulse  sequences of the brain and surrounding structures were acquired without intravenous contrast. Angiographic images of the Circle of Willis were acquired using MRA technique without intravenous contrast. COMPARISON:  CT head without contrast 09/12/2021 FINDINGS: MRI HEAD FINDINGS Brain: Acute nonhemorrhagic infarct is present in the right paramedian pons measuring up to 10 mm. T2 hyperintensities are associated with the acute/subacute infarct. No acute hemorrhage is present. Minimal white matter changes are otherwise within normal limits for age. The ventricles are of normal size. No significant extraaxial fluid collection is present. Basal ganglia are within normal limits. Cerebellum is unremarkable. The internal auditory canals are within normal limits. Vascular: Flow is present in the major intracranial arteries. Skull and upper cervical spine: The craniocervical junction is normal. Upper cervical spine is within normal limits. Marrow signal is unremarkable.  Sinuses/Orbits: Some fluid is present in the inferior right mastoid air cells. No obstructing nasopharyngeal lesion is present. The globes and orbits are within normal limits. MRA HEAD FINDINGS Anterior circulation: The internal carotid arteries are within normal limits from the high cervical segments through the ICA termini. The A1 and M1 segments are normal. The anterior communicating artery is patent. ACA and MCA branch vessels are within normal limits. Posterior circulation: The vertebral arteries are codominant. PICA origins are just below the field of view. PICA arteries appear to be within normal limits. Basilar artery is normal. The right posterior cerebral artery originates from basilar tip. The left posterior cerebral artery is of fetal type. PCA branch vessels are within normal limits. Anatomic variants: None IMPRESSION: 1. 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 2. Normal MRA circle-of-Willis without significant proximal stenosis, aneurysm, or branch vessel occlusion. These results were called by telephone at the time of interpretation on 09/13/2021 at 10:13 am to provider [facility] , who verbally acknowledged these results. Electronically Signed   By: San Morelle M.D.   On: 09/13/2021 10:30   US Carotid Bilateral  Result Date: 09/13/2021 CLINICAL DATA:  Left-sided weakness CVA Hypertension Stroke Hyperlipidemia Diabetes Smoker EXAM: BILATERAL CAROTID DUPLEX ULTRASOUND TECHNIQUE: Pearline Cables scale imaging, color Doppler and duplex ultrasound were performed of bilateral carotid and vertebral arteries in the neck. COMPARISON:  None. FINDINGS: Criteria: Quantification of carotid stenosis is based on velocity parameters that correlate the residual internal carotid diameter with NASCET-based stenosis levels, using the diameter of the distal internal carotid lumen as the denominator for stenosis measurement. The following velocity measurements were obtained: RIGHT ICA:  85/26 cm/sec CCA: 65/68 cm/sec SYSTOLIC ICA/CCA RATIO:  1.2 ECA: 61 cm/sec LEFT ICA: 75/23 cm/sec CCA: 12/75 cm/sec SYSTOLIC ICA/CCA RATIO:  1.2 ECA: 83 cm/sec RIGHT CAROTID ARTERY: Mild heterogeneous plaque of the carotid bifurcation. RIGHT VERTEBRAL ARTERY:  Limited visualization. LEFT CAROTID ARTERY: Mild heterogeneous plaque of the carotid bifurcation. LEFT VERTEBRAL ARTERY:  Antegrade flow. IMPRESSION: 1. Less than 50% stenosis of the internal carotid arteries. 2. Limited visualization of the right vertebral artery. Antegrade flow seen within the left vertebral artery. Electronically Signed   By: Miachel Roux M.D.   On: 09/13/2021 11:30   ECHOCARDIOGRAM COMPLETE  Result Date: 09/13/2021    ECHOCARDIOGRAM REPORT   Patient Name:   Bryce Adams Date of Exam: 09/13/2021 Medical Rec #:  [id number]   Height:       69.0 in Accession #:    [id number]  Weight:       189.3 lb Date of Birth:  06/30/1951  BSA:          2.018 m Patient Age:  70 years    BP:           145/80 mmHg Patient Gender: M           HR:           78 bpm. Exam Location:  [facility] Procedure: 2D Echo, Cardiac Doppler and Color Doppler Indications:    Stroke I63.9  History:        Patient has no prior history of Echocardiogram examinations.                 Risk Factors:Hypertension, Diabetes and Dyslipidemia.  Sonographer:    Alvino Chapel RCS Referring Phys: [id number] ASIA B Wallace  1. Left ventricular ejection fraction, by estimation, is 60 to 65%. The left ventricle has normal function. The left ventricle has no regional wall motion abnormalities. There is mild left ventricular hypertrophy. Left ventricular diastolic parameters are consistent with Grade I diastolic dysfunction (impaired relaxation).  2. Right ventricular systolic function is normal. The right ventricular size is normal. Tricuspid regurgitation signal is inadequate for assessing PA pressure.  3. The mitral valve is abnormal. Mild mitral valve regurgitation.  Moderate mitral annular calcification.  4. The aortic valve is tricuspid. There is moderate calcification of the aortic valve. Aortic valve regurgitation is not visualized. Mild aortic valve stenosis. Aortic valve area, by VTI measures 1.79 cm. Aortic valve mean gradient measures 9.0 mmHg.  5. The inferior vena cava is normal in size with greater than 50% respiratory variability, suggesting right atrial pressure of 3 mmHg. Comparison(s): No prior Echocardiogram. FINDINGS  Left Ventricle: Left ventricular ejection fraction, by estimation, is 60 to 65%. The left ventricle has normal function. The left ventricle has no regional wall motion abnormalities. The left ventricular internal cavity size was normal in size. There is  mild left ventricular hypertrophy. Left ventricular diastolic parameters are consistent with Grade I diastolic dysfunction (impaired relaxation). Right Ventricle: The right ventricular size is normal. No increase in right ventricular wall thickness. Right ventricular systolic function is normal. Tricuspid regurgitation signal is inadequate for assessing PA pressure. Left Atrium: Left atrial size was normal in size. Right Atrium: Right atrial size was normal in size. Pericardium: There is no evidence of pericardial effusion. Mitral Valve: The mitral valve is abnormal. There is mild calcification of the mitral valve leaflet(s). Moderate mitral annular calcification. Mild mitral valve regurgitation. Tricuspid Valve: The tricuspid valve is grossly normal. Tricuspid valve regurgitation is trivial. Aortic Valve: The aortic valve is tricuspid. There is moderate calcification of the aortic valve. There is moderate aortic valve annular calcification. Aortic valve regurgitation is not visualized. Mild aortic stenosis is present. Aortic valve mean gradient measures 9.0 mmHg. Aortic valve peak gradient measures 18.0 mmHg. Aortic valve area, by VTI measures 1.79 cm. Pulmonic Valve: The pulmonic valve was  grossly normal. Pulmonic valve regurgitation is trivial. Aorta: The aortic root is normal in size and structure. Venous: The inferior vena cava is normal in size with greater than 50% respiratory variability, suggesting right atrial pressure of 3 mmHg. IAS/Shunts: No atrial level shunt detected by color flow Doppler.  LEFT VENTRICLE PLAX 2D LVIDd:         4.40 cm   Diastology LVIDs:         2.50 cm   LV e' medial:    6.42 cm/s LV PW:         1.30 cm   LV E/e' medial:  11.0 LV IVS:        1.20  cm   LV e' lateral:   7.62 cm/s LVOT diam:     1.90 cm   LV E/e' lateral: 9.3 LV SV:         78 LV SV Index:   38 LVOT Area:     2.84 cm  RIGHT VENTRICLE RV S prime:     14.80 cm/s TAPSE (M-mode): 2.4 cm LEFT ATRIUM             Index        RIGHT ATRIUM           Index LA diam:        3.60 cm 1.78 cm/m   RA Area:     17.00 cm LA Vol (A2C):   61.5 ml 30.47 ml/m  RA Volume:   43.80 ml  21.70 ml/m LA Vol (A4C):   46.8 ml 23.19 ml/m LA Biplane Vol: 54.8 ml 27.15 ml/m  AORTIC VALVE AV Area (Vmax):    1.82 cm AV Area (Vmean):   1.89 cm AV Area (VTI):     1.79 cm AV Vmax:           212.00 cm/s AV Vmean:          140.000 cm/s AV VTI:            0.435 m AV Peak Grad:      18.0 mmHg AV Mean Grad:      9.0 mmHg LVOT Vmax:         136.00 cm/s LVOT Vmean:        93.200 cm/s LVOT VTI:          0.274 m LVOT/AV VTI ratio: 0.63  AORTA Ao Root diam: 2.70 cm MITRAL VALVE MV Area (PHT): 2.66 cm    SHUNTS MV Decel Time: 285 msec    Systemic VTI:  0.27 m MV E velocity: 70.60 cm/s  Systemic Diam: 1.90 cm MV A velocity: 86.00 cm/s MV E/A ratio:  0.82 Rozann Lesches MD Electronically signed by Rozann Lesches MD Signature Date/Time: 09/13/2021/8:39:43 PM    Final      Subjective: Patient interviewed in his room this morning.  He states that he feels a lot better this morning.  Patient did express some frustration about being in the hospital; stating that it is difficult for him to get any sleep while here.  Otherwise patient states he is  feeling well.  Patient states that he would like to go home today.  Patient denies any neurological deficits.  Patient expressed no concerns about returning home.  Patient currently denying headaches, change in the vision, chest pains or palpitations.   Discharge Exam: Vitals:   09/14/21 0352 09/14/21 0831  BP: 140/66 125/74  Pulse: 69 74  Resp: 20 20  Temp: 98.5 F (36.9 C)   SpO2: 95% 96%   Vitals:   09/13/21 1412 09/13/21 2031 09/14/21 0352 09/14/21 0831  BP: (!) 145/80 (!) 163/81 140/66 125/74  Pulse: 78 75 69 74  Resp: 20 20 20 20   Temp:  97.7 F (36.5 C) 98.5 F (36.9 C)   TempSrc:  Oral    SpO2:  97% 95% 96%  Weight:      Height:        General: Pt is alert, awake, not in acute distress Cardiovascular: RRR, S1/S2 +, no rubs, no gallops Respiratory: CTA bilaterally, no wheezing, no rhonchi Neurological: Speech: clear and fluent. Motor: Minimal drift of left arm when outstretched.  Strength symmetrical on both sides, 4+/5  Cranial nerves: Pupils are equal, round and reactive to light and accomodation; extra ocular movements are full, there is no significant nystagmus; visual fields are full; upper and lower facial muscles are normal in strength and symmetric. Abdominal: Soft, NT, ND, bowel sounds + Extremities: no edema, no cyanosis   The results of significant diagnostics from this hospitalization (including imaging, microbiology, ancillary and laboratory) are listed below for reference.     Microbiology: Recent Results (from the past 240 hour(s))  Resp Panel by RT-PCR (Flu A&B, Covid) Nasopharyngeal Swab     Status: None   Collection Time: 09/12/21  7:37 PM   Specimen: Nasopharyngeal Swab; Nasopharyngeal(NP) swabs in vial transport medium  Result Value Ref Range Status   SARS Coronavirus 2 by RT PCR NEGATIVE NEGATIVE Final    Comment: (NOTE) SARS-CoV-2 target nucleic acids are NOT DETECTED.  The SARS-CoV-2 RNA is generally detectable in upper  respiratory specimens during the acute phase of infection. The lowest concentration of SARS-CoV-2 viral copies this assay can detect is 138 copies/mL. A negative result does not preclude SARS-Cov-2 infection and should not be used as the sole basis for treatment or other patient management decisions. A negative result may occur with  improper specimen collection/handling, submission of specimen other than nasopharyngeal swab, presence of viral mutation(s) within the areas targeted by this assay, and inadequate number of viral copies(<138 copies/mL). A negative result must be combined with clinical observations, patient history, and epidemiological information. The expected result is Negative.  Fact Sheet for Patients:  EntrepreneurPulse.com.au  Fact Sheet for Healthcare Providers:  IncredibleEmployment.be  This test is no t yet approved or cleared by the [place] FDA and  has been authorized for detection and/or diagnosis of SARS-CoV-2 by FDA under an Emergency Use Authorization (EUA). This EUA will remain  in effect (meaning this test can be used) for the duration of the COVID-19 declaration under Section 564(b)(1) of the Act, 21 U.S.C.section 360bbb-3(b)(1), unless the authorization is terminated  or revoked sooner.       Influenza A by PCR NEGATIVE NEGATIVE Final   Influenza B by PCR NEGATIVE NEGATIVE Final    Comment: (NOTE) The Xpert Xpress SARS-CoV-2/FLU/RSV plus assay is intended as an aid in the diagnosis of influenza from Nasopharyngeal swab specimens and should not be used as a sole basis for treatment. Nasal washings and aspirates are unacceptable for Xpert Xpress SARS-CoV-2/FLU/RSV testing.  Fact Sheet for Patients: EntrepreneurPulse.com.au  Fact Sheet for Healthcare Providers: IncredibleEmployment.be  This test is not yet approved or cleared by the [place] FDA and has been  authorized for detection and/or diagnosis of SARS-CoV-2 by FDA under an Emergency Use Authorization (EUA). This EUA will remain in effect (meaning this test can be used) for the duration of the COVID-19 declaration under Section 564(b)(1) of the Act, 21 U.S.C. section 360bbb-3(b)(1), unless the authorization is terminated or revoked.  Performed at [facility], [address] [place], [place] [address]      Labs: BNP (last 3 results) No results for input(s): BNP in the last 8760 hours. Basic Metabolic Panel: Recent Labs  Lab 09/12/21 1751 09/13/21 0538 09/14/21 0526  NA 133* 132* 133*  K 4.8 4.2 4.3  CL 100 101 103  CO2 20* 24 23  GLUCOSE 186* 231* 220*  BUN 33* 33* 31*  CREATININE 1.46* 1.05 1.07  CALCIUM 9.2 9.4 9.1  MG  --   --  2.1   Liver Function Tests: Recent Labs  Lab 09/12/21 1751 09/13/21  0538  AST 34 24  ALT 44 38  ALKPHOS 88 85  BILITOT 0.2* 0.3  PROT 7.1 6.6  ALBUMIN 3.9 3.7   No results for input(s): LIPASE, AMYLASE in the last 168 hours. No results for input(s): AMMONIA in the last 168 hours. CBC: Recent Labs  Lab 09/12/21 1751 09/13/21 0538 09/14/21 0526  WBC 11.0* 10.4 10.0  NEUTROABS 7.7  --  6.9  HGB 13.8 13.3 13.7  HCT 39.8 38.0* 39.5  MCV 94.3 92.5 92.1  PLT 246 242 236   Cardiac Enzymes: No results for input(s): CKTOTAL, CKMB, CKMBINDEX, TROPONINI in the last 168 hours. BNP: Invalid input(s): POCBNP CBG: Recent Labs  Lab 09/13/21 0723 09/13/21 1119 09/13/21 1621 09/13/21 2029 09/14/21 0741  GLUCAP 240* 287* 362* 223* 256*   D-Dimer No results for input(s): DDIMER in the last 72 hours. Hgb A1c Recent Labs    09/13/21 0538  HGBA1C 9.4*   Lipid Profile Recent Labs    09/13/21 0538  CHOL 218*  HDL NOT REPORTED DUE TO HIGH TRIGLYCERIDES  LDLCALC NOT CALCULATED  TRIG 1,349*  CHOLHDL NOT REPORTED DUE TO HIGH TRIGLYCERIDES   Thyroid function studies No results for input(s): TSH, T4TOTAL, T3FREE, THYROIDAB in the  last 72 hours.  Invalid input(s): FREET3 Anemia work up No results for input(s): VITAMINB12, FOLATE, FERRITIN, TIBC, IRON, RETICCTPCT in the last 72 hours. Urinalysis    Component Value Date/Time   COLORURINE STRAW (A) 09/12/2021 2041   APPEARANCEUR CLEAR 09/12/2021 2041   LABSPEC 1.018 09/12/2021 2041   PHURINE 7.0 09/12/2021 2041   GLUCOSEU >=500 (A) 09/12/2021 2041   HGBUR NEGATIVE 09/12/2021 2041   BILIRUBINUR NEGATIVE 09/12/2021 2041   KETONESUR 5 (A) 09/12/2021 2041   PROTEINUR 100 (A) 09/12/2021 2041   UROBILINOGEN 0.2 12/01/2008 1100   NITRITE NEGATIVE 09/12/2021 2041   LEUKOCYTESUR NEGATIVE 09/12/2021 2041   Sepsis Labs Invalid input(s): PROCALCITONIN,  WBC,  LACTICIDVEN Microbiology Recent Results (from the past 240 hour(s))  Resp Panel by RT-PCR (Flu A&B, Covid) Nasopharyngeal Swab     Status: None   Collection Time: 09/12/21  7:37 PM   Specimen: Nasopharyngeal Swab; Nasopharyngeal(NP) swabs in vial transport medium  Result Value Ref Range Status   SARS Coronavirus 2 by RT PCR NEGATIVE NEGATIVE Final    Comment: (NOTE) SARS-CoV-2 target nucleic acids are NOT DETECTED.  The SARS-CoV-2 RNA is generally detectable in upper respiratory specimens during the acute phase of infection. The lowest concentration of SARS-CoV-2 viral copies this assay can detect is 138 copies/mL. A negative result does not preclude SARS-Cov-2 infection and should not be used as the sole basis for treatment or other patient management decisions. A negative result may occur with  improper specimen collection/handling, submission of specimen other than nasopharyngeal swab, presence of viral mutation(s) within the areas targeted by this assay, and inadequate number of viral copies(<138 copies/mL). A negative result must be combined with clinical observations, patient history, and epidemiological information. The expected result is Negative.  Fact Sheet for Patients:   EntrepreneurPulse.com.au  Fact Sheet for Healthcare Providers:  IncredibleEmployment.be  This test is no t yet approved or cleared by the [place] FDA and  has been authorized for detection and/or diagnosis of SARS-CoV-2 by FDA under an Emergency Use Authorization (EUA). This EUA will remain  in effect (meaning this test can be used) for the duration of the COVID-19 declaration under Section 564(b)(1) of the Act, 21 U.S.C.section 360bbb-3(b)(1), unless the authorization is terminated  or revoked sooner.  Influenza A by PCR NEGATIVE NEGATIVE Final   Influenza B by PCR NEGATIVE NEGATIVE Final    Comment: (NOTE) The Xpert Xpress SARS-CoV-2/FLU/RSV plus assay is intended as an aid in the diagnosis of influenza from Nasopharyngeal swab specimens and should not be used as a sole basis for treatment. Nasal washings and aspirates are unacceptable for Xpert Xpress SARS-CoV-2/FLU/RSV testing.  Fact Sheet for Patients: EntrepreneurPulse.com.au  Fact Sheet for Healthcare Providers: IncredibleEmployment.be  This test is not yet approved or cleared by the [place] FDA and has been authorized for detection and/or diagnosis of SARS-CoV-2 by FDA under an Emergency Use Authorization (EUA). This EUA will remain in effect (meaning this test can be used) for the duration of the COVID-19 declaration under Section 564(b)(1) of the Act, 21 U.S.C. section 360bbb-3(b)(1), unless the authorization is terminated or revoked.  Performed at [facility], [address], [place], [place] [address]     Time coordinating discharge: 30 minutes.  SIGNED:  Gerrit Halls, MD  [facility] 09/14/2021, 9:24 AM How to contact the [facility] Attending or Consulting provider [place] or covering provider during after hours [place], for this patient?  Check the care team in [facility] and look for a) attending/consulting TRH provider  listed and b) the [facility] team listed Log into [email or web address] and use [facility]'s universal password to access. If you do not have the password, please contact the hospital operator. Locate the [facility] provider you are looking for under [facility] and page to a number that you can be directly reached. If you still have difficulty reaching the provider, please page the [facility] (Director on Call) for the Hospitalists listed on amion for assistance.

## 2021-09-14 NOTE — TOC Initial Note (Signed)
Transition of Care [facility]) - Initial/Assessment Note    Patient Details  Name: Bryce Adams MRN: [id number] Date of Birth: 06-16-1951  Transition of Care [facility]) CM/SW Contact:    Joaquin Courts, RN Phone Number: 09/14/2021, 10:34 AM  Clinical Narrative:  Patient recommended for outpatient PT services,  referral placed.  No further TOC needs identified.                  Expected Discharge Plan: Home/Self Care Barriers to Discharge: No Barriers Identified   Patient Goals and CMS Choice Patient states their goals for this hospitalization and ongoing recovery are:: to return home      Expected Discharge Plan and Services Expected Discharge Plan: Home/Self Care   Discharge Planning Services: CM Consult   Living arrangements for the past 2 months: Single Family Home Expected Discharge Date: 09/14/21                                    Prior Living Arrangements/Services Living arrangements for the past 2 months: Single Family Home   Patient language and need for interpreter reviewed:: Yes Do you feel safe going back to the place where you live?: Yes      Need for Family Participation in Patient Care: Yes (Comment) Care giver support system in place?: Yes (comment)   Criminal Activity/Legal Involvement Pertinent to Current Situation/Hospitalization: No - Comment as needed  Activities of Daily Living Home Assistive Devices/Equipment: None ADL Screening (condition at time of admission) Patient's cognitive ability adequate to safely complete daily activities?: Yes Is the patient deaf or have difficulty hearing?: No Does the patient have difficulty seeing, even when wearing glasses/contacts?: No Does the patient have difficulty concentrating, remembering, or making decisions?: No Patient able to express need for assistance with ADLs?: Yes Does the patient have difficulty dressing or bathing?: No Independently performs ADLs?: Yes (appropriate for developmental  age) Does the patient have difficulty walking or climbing stairs?: No Weakness of Legs: Left Weakness of Arms/Hands: Left  Permission Sought/Granted                  Emotional Assessment Appearance:: Appears stated age Attitude/Demeanor/Rapport: Engaged Affect (typically observed): Accepting Orientation: : Oriented to Self, Oriented to Place, Oriented to  Time, Oriented to Situation   Psych Involvement: No (comment)  Admission diagnosis:  CVA (cerebral vascular accident) ([place]) [I63.9] Stroke [facility]) [I63.9] Acute ischemic stroke [facility]) [I63.9] Patient Active Problem List   Diagnosis Date Noted   Stroke ([place]) 09/12/2021   Deviated nasal septum 09/12/2021   Hyperlipidemia, mixed 09/12/2021   Tobacco use disorder 09/12/2021   Anemia, deficiency 09/09/2021   Low magnesium level 09/09/2021   Adenomatous polyp of transverse colon 10/20/2019   Sigmoid diverticulosis 10/20/2019   Stage 3a chronic kidney disease ([place]) 04/18/2019   Pain in right knee 03/27/2019   Family history of colon cancer 01/15/2017   Acquired hypothyroidism 01/01/2015   DMII (diabetes mellitus, type 2) ([place]) 01/01/2015   Encounter for long-term (current) use of medications 06/15/2014   Vitamin D deficiency 12/10/2008   Nonspecific abnormal results of liver function study 12/08/2008   Obesity 12/19/2006   Bundle branch block, 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

## 2021-09-16 ENCOUNTER — Other Ambulatory Visit: Payer: Self-pay | Admitting: Family Medicine

## 2021-09-16 DIAGNOSIS — I639 Cerebral infarction, unspecified: Secondary | ICD-10-CM

## 2021-09-20 DIAGNOSIS — R531 Weakness: Secondary | ICD-10-CM | POA: Insufficient documentation

## 2021-09-22 ENCOUNTER — Encounter (HOSPITAL_COMMUNITY): Payer: Self-pay

## 2021-09-22 ENCOUNTER — Other Ambulatory Visit: Payer: Self-pay

## 2021-09-22 ENCOUNTER — Ambulatory Visit (HOSPITAL_COMMUNITY): Payer: No Typology Code available for payment source | Attending: Family Medicine

## 2021-09-22 DIAGNOSIS — R29898 Other symptoms and signs involving the musculoskeletal system: Secondary | ICD-10-CM | POA: Insufficient documentation

## 2021-09-22 NOTE — Patient Instructions (Signed)
Theraputty Home Exercise Program  Complete 1-2 times a day.  putty squeeze  Pt. should squeeze putty in hand trying to keep it round by rotating putty after each squeeze. push fingers through putty to palm each time. Complete for __3-5____ minutes.   PUTTY KEY GRIP  Hold the putty at the top of your hand. Squeeze the putty between your thumb and the side of your 2nd finger as shown. Complete for __3-5______ minutes.    PUTTY 3 JAW CHUCK  Roll up some putty into a ball then flatten it. Then, firmly squeeze it with your first 3 fingers as shown. Complete for __3-5____ minutes.    [place] Kitchen  1) Strengthening: Chest Pull - Resisted   Hold Theraband in front of body with hands about shoulder width a part. Pull band a part and back together slowly. Repeat __10-20__ times. Complete _1___ set(s) per session.. Repeat ___1-2_ session(s) per day.  http://orth.exer.us/926   Copyright  VHI. All rights reserved.   2) PNF Strengthening: Resisted   Standing with resistive band around each hand, bring right arm up and away, thumb back. Repeat __10-20__ times per set. Do __1__ sets per session. Do __1-2__ sessions per day.      3) Resisted External Rotation: in Neutral - Bilateral   Sit or stand, tubing in both hands, elbows at sides, bent to 90, forearms forward. Pinch shoulder blades together and rotate forearms out. Keep elbows at sides. Repeat __10-20__ times per set. Do __1__ sets per session. Do ___1-2_ sessions per day.

## 2021-09-27 NOTE — Therapy (Signed)
[facility] [place], [place], [address] Phone: [phone]   Fax:  [phone]  Occupational Therapy Evaluation  Patient Details  Name: Bryce Adams MRN: [id number] Date of Birth: 11/06/1951 Referring Provider (OT): Myrene Buddy, MD   Encounter Date: 09/22/2021   OT End of Session - 09/27/21 1331     Visit Number 1    Number of Visits 1    Authorization Type [place] [employer]    Authorization Time Period no copay, 30 visit limit PT/SLP/OT 0 used.    Authorization - Visit Number 1    Authorization - Number of Visits 30    OT Start Time 1430    OT Stop Time 1503    OT Time Calculation (min) 33 min    Activity Tolerance Patient tolerated treatment well    Behavior During Therapy WFL for tasks assessed/performed             Past Medical History:  Diagnosis Date   Diabetes mellitus without complication ([place])    Hyperlipemia    Hypertension    Renal disorder    Thyroid disease     Past Surgical History:  Procedure Laterality Date   CARDIAC CATHETERIZATION     CARPAL TUNNEL RELEASE     HAND TENDON SURGERY      There were no vitals filed for this visit.         09/22/21 1438  Assessment  Medical Diagnosis S/P Right CVA  Referring Provider (OT) Myrene Buddy, MD  Onset Date/Surgical Date 09/12/21  Hand Dominance Right  Next MD Visit N/A  Prior Therapy Acute OT, PT, SLP evaluation  Precautions  Precautions None  Restrictions  Weight Bearing Restrictions No  Balance Screen  Has the patient fallen in the past 6 months No  Home  Environment  Family/patient expects to be discharged to: Private residence  Prior Function  Level of Independence Independent  Vocation Retired  Geneticist, molecular  ADL  ADL comments Pt reports no difficulty completing daily tasks at this time. He does notice a slight difference with his strength on the left compared to the right.   Mobility  Mobility Status Independent  Written Expression  Dominant Hand Right  Vision - History  Baseline Vision Wears glasses all the time  Cognition  Overall Cognitive Status Within Functional Limits for tasks assessed  Observation/Other Assessments  Focus on Therapeutic Outcomes (FOTO)  N/A  Coordination  Gross Motor Movements are Fluid and Coordinated Yes  Fine Motor Movements are Fluid and Coordinated Yes  9 Hole Peg Test Right;Left  Right 9 Hole Peg Test 25.3"  Left 9 Hole Peg Test 27.9"  ROM / Strength  AROM / PROM / Strength Strength  Strength  Strength Assessment Site Hand  Overall Strength Comments BUE shoulder, elbow, and wrist strength 5/5 in all ranges.  Right/Left hand Right;Left  Right Hand Lateral Pinch 24 lbs  Right Hand 3 Point Pinch 20 lbs  Left Hand Lateral Pinch 22 lbs  Left Hand 3 Point Pinch 18 lbs  Right Hand Grip (lbs) 100  Left Hand Grip (lbs) 90                   OT Education - 09/27/21 1330     Education Details hand strengthening with putty. Shoulder/scapular strength and stability exercises with blue band.    Person(s) Educated Patient;Spouse    Methods Explanation;Demonstration;Verbal cues;Handout    Comprehension Returned demonstration;Verbalized understanding  OT Short Term Goals - 09/27/21 1335       OT SHORT TERM GOAL #1   Title Pt and wife will be educated and verbalize understandinf of HEP in order to further increase his overall hand strength and UB strength and stability in order to participate in all desired ADL tasks.    Time 1    Period Days    Status Achieved    Target Date 09/22/21                      Plan - 09/27/21 1332     Clinical Impression Statement A: Patient is a 70 y/o S/P acute ischemic stroke and is experiencing very mild hand weakness and UB endurance. Patient reports that he was experiencing more weakness just a few days ago, and he is already noticing a large  improvement. HEP provided and reviewed. Patient is independent with completion and able to work on these mild deficits at home. Patient and wife requested that this evaluation be forwarded to PCP Bryce Hong, MD.    OT Occupational Profile and History Problem Focused Assessment - Including review of records relating to presenting problem    Body Structure / Function / Physical Skills Strength    Rehab Potential Excellent    Clinical Decision Making Limited treatment options, no task modification necessary    Comorbidities Affecting Occupational Performance: May have comorbidities impacting occupational performance    Modification or Assistance to Complete Evaluation  No modification of tasks or assist necessary to complete eval    OT Frequency One time visit    OT Treatment/Interventions Patient/family education    Plan P: One time evaluation and HEP established. No further OT services needed at this time.    OT Home Exercise Plan eval: hand strengthening- putty, shoulder/scapular stability blue band.    Consulted and Agree with Plan of Care Family member/caregiver;Patient    Family Member Consulted Wife             Patient will benefit from skilled therapeutic intervention in order to improve the following deficits and impairments:   Body Structure / Function / Physical Skills: Strength       Visit Diagnosis: Other symptoms and signs involving the musculoskeletal system - Plan: Ot plan of care cert/re-cert    Problem List Patient Active Problem List   Diagnosis Date Noted   Stroke ([place]) 09/12/2021   Deviated nasal septum 09/12/2021   Hyperlipidemia, mixed 09/12/2021   Tobacco use disorder 09/12/2021   Anemia, deficiency 09/09/2021   Low magnesium level 09/09/2021   Adenomatous polyp of transverse colon 10/20/2019   Sigmoid diverticulosis 10/20/2019   Stage 3a chronic kidney disease ([place]) 04/18/2019   Pain in right knee 03/27/2019   Family history of colon cancer 01/15/2017    Acquired hypothyroidism 01/01/2015   DMII (diabetes mellitus, type 2) ([place]) 01/01/2015   Encounter for long-term (current) use of medications 06/15/2014   Vitamin D deficiency 12/10/2008   Nonspecific abnormal results of liver function study 12/08/2008   Obesity 12/19/2006   Bundle branch block, 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

## 2021-09-28 ENCOUNTER — Ambulatory Visit (HOSPITAL_COMMUNITY): Payer: No Typology Code available for payment source | Admitting: Physical Therapy

## 2021-11-25 ENCOUNTER — Encounter: Payer: Self-pay | Admitting: Nurse Practitioner

## 2021-11-25 ENCOUNTER — Other Ambulatory Visit: Payer: No Typology Code available for payment source

## 2021-11-25 ENCOUNTER — Ambulatory Visit (INDEPENDENT_AMBULATORY_CARE_PROVIDER_SITE_OTHER): Payer: No Typology Code available for payment source | Admitting: Nurse Practitioner

## 2021-11-25 VITALS — BP 120/60 | HR 70 | Ht 69.0 in | Wt 180.4 lb

## 2021-11-25 DIAGNOSIS — A048 Other specified bacterial intestinal infections: Secondary | ICD-10-CM

## 2021-11-25 DIAGNOSIS — R197 Diarrhea, unspecified: Secondary | ICD-10-CM | POA: Diagnosis not present

## 2021-11-25 DIAGNOSIS — R1310 Dysphagia, unspecified: Secondary | ICD-10-CM

## 2021-11-25 NOTE — Patient Instructions (Addendum)
IMAGING: You will be contacted by [facility] (Your caller ID will indicate phone # [phone]) in the next 7 days to schedule your Swallow Test. If you have not heard from them within 7 business days, please call [facility] at [phone] to follow up on the status of your appointment.    LABS:  Lab work has been ordered for you today. Our lab is located in the basement. Press "B" on the elevator. The lab is located at the first door on the left as you exit the elevator.  HEALTHCARE LAWS AND MY CHART RESULTS: Due to recent changes in healthcare laws, you may see the results of your imaging and laboratory studies on MyChart before your provider has had a chance to review them.   We understand that in some cases there may be results that are confusing or concerning to you. Not all laboratory results come back in the same time frame and the provider may be waiting for multiple results in order to interpret others.  Please give [place] 48 hours in order for your provider to thoroughly review all the results before contacting the office for clarification of your results.   RECOMMENDATIONS: Reduce coffee intake to 1-2 cups a day. Take a probiotic of your choice once a day. May take Lactaid 1-2 tablets with each dairy product. Further follow up to be determined after swallow test and stool tests are complete.  It was great seeing you today! Thank you for entrusting me with your care and choosing [facility].  Noralyn Pick, CRNP  The [facility] providers would like to encourage you to use [facility] to communicate with providers for non-urgent requests or questions.  Due to long hold times on the telephone, sending your provider a message by [facility] may be faster and more efficient way to get a response. Please allow 48 business hours for a response.  Please remember that this is for non-urgent requests/questions. If you are age 17 or older,  your body mass index should be between 23-30. Your Body mass index is 26.64 kg/m. If this is out of the aforementioned range listed, please consider follow up with your Primary Care Provider.  If you are age 98 or younger, your body mass index should be between 19-25. Your Body mass index is 26.64 kg/m. If this is out of the aformentioned range listed, please consider follow up with your Primary Care Provider.

## 2021-11-25 NOTE — Progress Notes (Signed)
11/25/2021 Bryce Adams [id number] [birth year]   Chief Complaint: Diarrhea, dysphagia  History of Present Illness: Bryce Adams is a 70 year old male with a past medical history of hypertension, hyperlipidemia, hypothyroidism, diabetes mellitus type 2, CKD, CVA 09/12/2021 on Plavix and colon polyps.  He presents her office today for further evaluation regarding dysphagia and diarrhea. He is accompanied by his wife. He describes having intermittent oral phase and esophageal dysphagia for several years prior to his acute CVA 08/2021 on Plavix. He intermittently swallows liquids, coughs and feels choked which he attributes to liquids "going down the wrong pipe" which has occurred almost daily for the past 6 months. He also has intermittent solid food dysphagia, describes food gets stuck in his mid to upper esophagus which occurs "every blue moon". He sometimes drinks water and the stuck food passes and he sometimes waits a minute or two and the stuck food passes down the esophagus. He rarely gags/vomits out the stuck food. No heartburn or upper abdominal pain. He has hiccups which starts after he eats or drinks for the past few years, more noticeable past 6 months. Normal chest xray 09/08/2021. He took ASA with Plavix x 4 weeks post CVA, then ASA was discontinued and he remains on Plavix. No other NSAID use.   He also complains of having loose stools since was in the hospital. Intermittent nonbloody loose stools. He is able to pass a solid stool 2 days weekly. He drinks 5 cups of caffeinated coffee daily. He eats cerea with milk most mornings. No recent antibiotics. He's been on the same dose of Metformin for 10+ years. Stress level is elevated as he is in the process of retiring. No fevers. No weight loss. His most recent colonoscopy was 10/17/2019, see results below.   Colonoscopy 10/17/2019 by Dr. Havery Moros: - One 4 mm polyp in the ascending colon, removed with a cold snare. Resected and  retrieved. - Two 3 to 4 mm polyps in the transverse colon, removed with a cold snare. Resected and retrieved. retrieved. - Three 3 to 4 mm polyps at the recto-sigmoid colon, removed with a cold snare. Resected and retrieved. - Diverticulosis in the sigmoid colon. - Internal hemorrhoids. - The examination was otherwise normal -Recall colonoscopy 3 years. Mother with history of colon cancer.  Surgical [P], colon, transverse, ascending and rectosigmoid, polyp (6) - TUBULAR ADENOMA(S). - NO HIGH GRADE DYSPLASIA OR MALIGNANCY. - HYPERPLASTIC POLYP(S).  CBC Latest Ref Rng & Units 09/14/2021 09/13/2021 09/12/2021  WBC 4.0 - 10.5 K/uL 10.0 10.4 11.0(H)  Hemoglobin 13.0 - 17.0 g/dL 13.7 13.3 13.8  Hematocrit 39.0 - 52.0 % 39.5 38.0(L) 39.8  Platelets 150 - 400 K/uL 236 242 246    CMP Latest Ref Rng & Units 09/14/2021 09/13/2021 09/12/2021  Glucose 70 - 99 mg/dL 220(H) 231(H) 186(H)  BUN 8 - 23 mg/dL 31(H) 33(H) 33(H)  Creatinine 0.61 - 1.24 mg/dL 1.07 1.05 1.46(H)  Sodium 135 - 145 mmol/L 133(L) 132(L) 133(L)  Potassium 3.5 - 5.1 mmol/L 4.3 4.2 4.8  Chloride 98 - 111 mmol/L 103 101 100  CO2 22 - 32 mmol/L 23 24 20(L)  Calcium 8.9 - 10.3 mg/dL 9.1 9.4 9.2  Total Protein 6.5 - 8.1 g/dL - 6.6 7.1  Total Bilirubin 0.3 - 1.2 mg/dL - 0.3 0.2(L)  Alkaline Phos 38 - 126 U/L - 85 88  AST 15 - 41 U/L - 24 34  ALT 0 - 44 U/L - 38 44    ECHO 10/14/2021: Left  ventricular ejection fraction, by estimation, is 60 to 65%. The left ventricle has normal function. The left ventricle has no regional wall motion abnormalities. There is mild left ventricular hypertrophy. Left ventricular diastolic parameters are consistent with Grade I diastolic dysfunction (impaired relaxation). 1. Right ventricular systolic function is normal. The right ventricular size is normal. Tricuspid regurgitation signal is inadequate for assessing PA pressure. 2. The mitral valve is abnormal. Mild mitral valve regurgitation.  Moderate mitral annular calcification. 3. The aortic valve is tricuspid. There is moderate calcification of the aortic valve. Aortic valve regurgitation is not visualized. Mild aortic valve stenosis. Aortic valve area, by VTI measures 1.79 cm. Aortic valve mean gradient measures 9.0 mmHg. 4. The inferior vena cava is normal in size with greater than 50% respiratory variability, suggesting right atrial pressure of 3 mmHg.  Past Medical History:  Diagnosis Date   Diabetes mellitus without complication ([place])    Hyperlipemia    Hypertension    Renal disorder    Thyroid disease    Past Surgical History:  Procedure Laterality Date   CARDIAC CATHETERIZATION     CARPAL TUNNEL RELEASE     HAND TENDON SURGERY     Current Outpatient Medications on File Prior to Visit  Medication Sig Dispense Refill   atorvastatin (LIPITOR) 20 MG tablet Take 20 mg by mouth every morning.     clopidogrel (PLAVIX) 75 MG tablet Take 1 tablet (75 mg total) by mouth daily. 30 tablet 11   famotidine (PEPCID) 20 MG tablet Take 20 mg by mouth daily.     hydrochlorothiazide (HYDRODIURIL) 25 MG tablet Take 25 mg by mouth every morning.     irbesartan (AVAPRO) 300 MG tablet Take 1 tablet by mouth daily.     levothyroxine (SYNTHROID, LEVOTHROID) 50 MCG tablet Take 50 mcg by mouth every morning.     Magnesium Oxide 400 MG CAPS Take 1 capsule by mouth 2 (two) times daily.     metFORMIN (GLUCOPHAGE) 1000 MG tablet Take 1,000 mg by mouth 2 (two) times daily with a meal.     Multiple Vitamin (MULTIVITAMIN WITH MINERALS) TABS Take 1 tablet by mouth daily.     omega-3 acid ethyl esters (LOVAZA) 1 G capsule Take 2 g by mouth 2 (two) times daily.     sitaGLIPtin (JANUVIA) 100 MG tablet Take 100 mg by mouth daily.     valsartan (DIOVAN) 160 MG tablet Take 160 mg by mouth daily.     VICTOZA 18 MG/3ML SOPN Inject 1.8 mg into the skin every evening.  4   No current facility-administered medications on file prior to visit.    No Known Allergies  Current Medications, Allergies, Past Medical History, Past Surgical History, Family History and Social History were reviewed in [employer] record.  Review of Systems:   Constitutional: Negative for fever, sweats, chills or weight loss.  Respiratory: Negative for shortness of breath.   Cardiovascular: Negative for chest pain, palpitations and leg swelling.  Gastrointestinal: See HPI.  Musculoskeletal: Negative for back pain or muscle aches.  Neurological: + CVA. No headaches or dizziness.  Physical Exam: BP 120/60    Pulse 70    Ht 5\' 9"  (1.753 m)    Wt 180 lb 6.4 oz (81.8 kg)    BMI 26.64 kg/m   General: 70 year old male in NAD.  Head: Normocephalic and atraumatic. Eyes: No scleral icterus. Conjunctiva pink . Ears: Normal auditory acuity. Mouth: No ulcers or lesions.  Lungs: Clear throughout  to auscultation. Heart: Regular rate and rhythm, no murmur. Abdomen: Soft, nontender and nondistended. No masses or hepatomegaly. Normal bowel sounds x 4 quadrants.  Rectal: Deferred. Musculoskeletal: Symmetrical with no gross deformities. Extremities: No edema. Neurological: Alert oriented x 4. No focal deficits.  Psychological: Alert and cooperative. Normal mood and affect  Assessment and Recommendations:  98) 70 year old male with oral phase dysphagia with liquids and esophageal dysphagia with solid foods, these symptoms were present several years prior to his CVA 08/2021. -Barium Swallow with Tablet -EGD deferred for now secondary to CVA 08/2021, consider EGD end of Jan. 2023 if barium swallow abnormal and/or if symptoms persist  -Continue Famotidine 20mg  po QD -Patient was instructed to avoid bread/large pieces of meat/rice, cut food into small pieces, chew food thoroughly. Contact our office if symptoms worsen.   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 Off Asa.

## 2021-11-26 NOTE — Progress Notes (Signed)
Agree with assessment as outlined.  Would start with nonivasive testing first given recent CVA. Agree with barium swallow and may also want to consider modified barium study pending result. If clear stricture or pathology that warrants an EGD will discuss timing with you.

## 2021-12-13 ENCOUNTER — Ambulatory Visit (HOSPITAL_COMMUNITY): Payer: No Typology Code available for payment source

## 2022-03-06 ENCOUNTER — Other Ambulatory Visit (HOSPITAL_COMMUNITY): Payer: Self-pay | Admitting: Internal Medicine

## 2022-03-06 ENCOUNTER — Other Ambulatory Visit: Payer: Self-pay | Admitting: Internal Medicine

## 2022-03-06 DIAGNOSIS — R748 Abnormal levels of other serum enzymes: Secondary | ICD-10-CM

## 2022-03-20 ENCOUNTER — Ambulatory Visit (HOSPITAL_COMMUNITY)
Admission: RE | Admit: 2022-03-20 | Discharge: 2022-03-20 | Disposition: A | Discharge status: Home / Self Care | Payer: No Typology Code available for payment source | Source: Ambulatory Visit | Attending: Internal Medicine | Admitting: Internal Medicine

## 2022-03-20 DIAGNOSIS — R748 Abnormal levels of other serum enzymes: Secondary | ICD-10-CM | POA: Diagnosis present

## 2022-03-20 IMAGING — US US ABDOMEN LIMITED
1 series · 14 of 25 positions shown · non-contrast
Comparison: None.

CLINICAL DATA: Elevated alkaline phosphatase

EXAM:
ULTRASOUND ABDOMEN LIMITED RIGHT UPPER QUADRANT

[Series 1: us abdomen limited ruq (liver/gb) · 102 acquisitions, 14 frames shown]
[im 1/102]
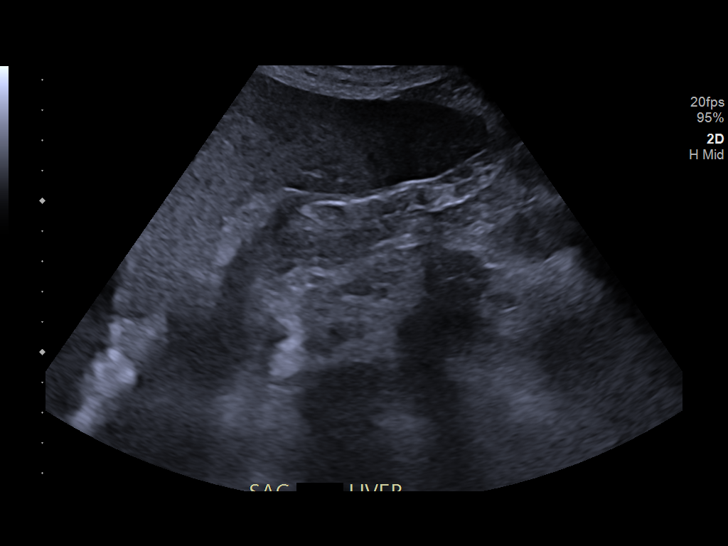
[im 9/102]
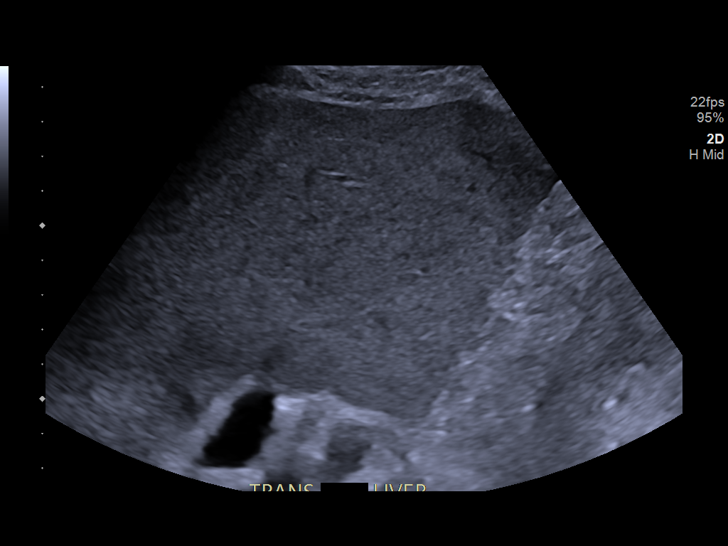
[im 17/102]
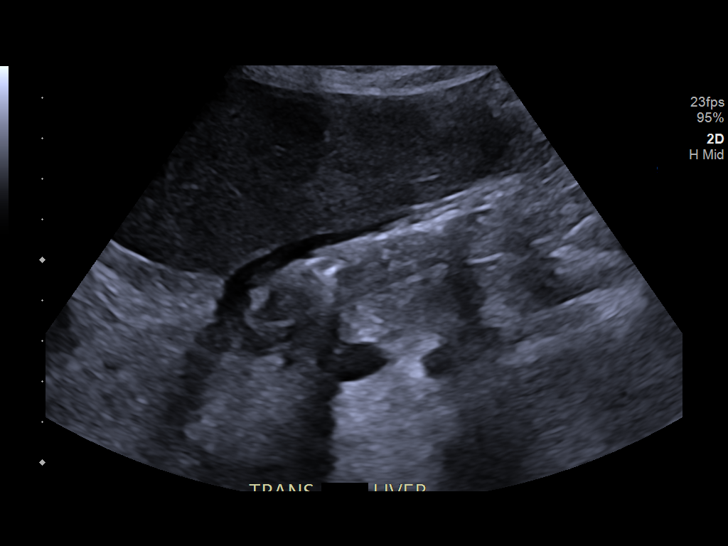
[im 26/102]
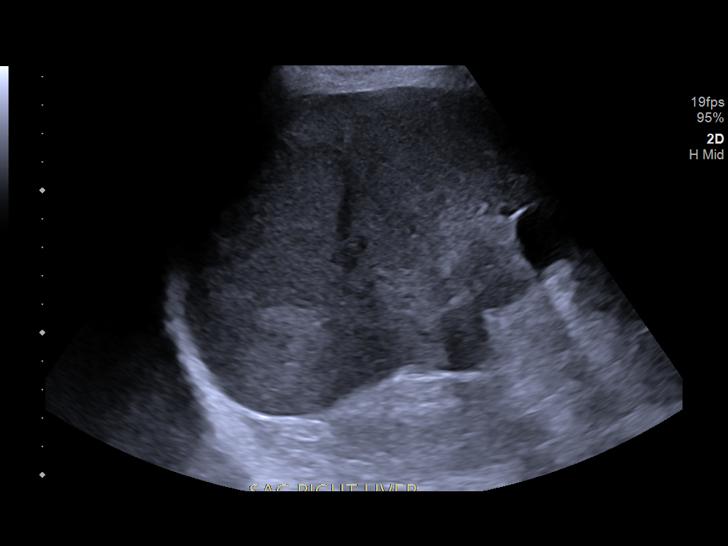
[im 34/102]
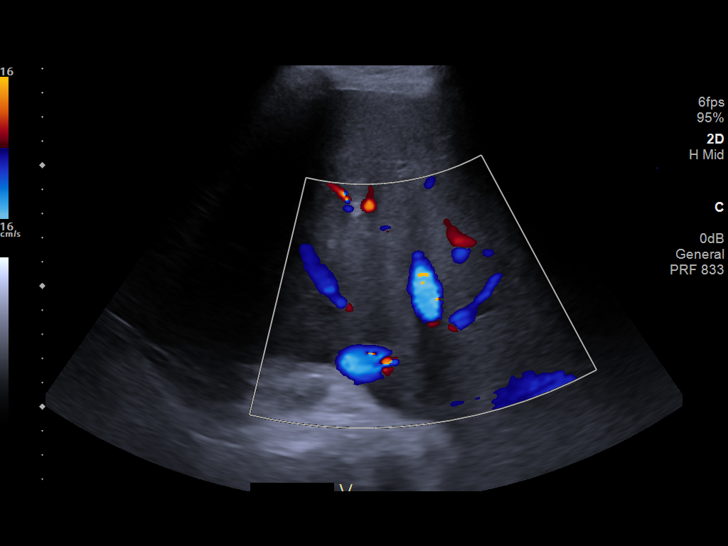
[im 38/102]
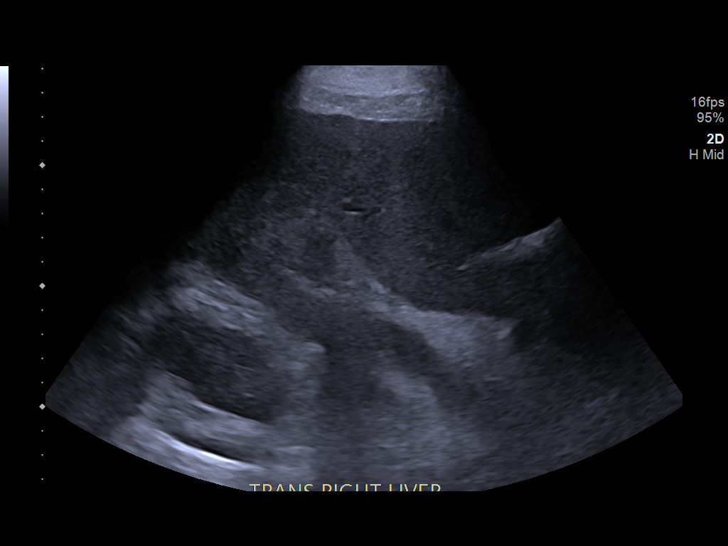
[im 47/102]
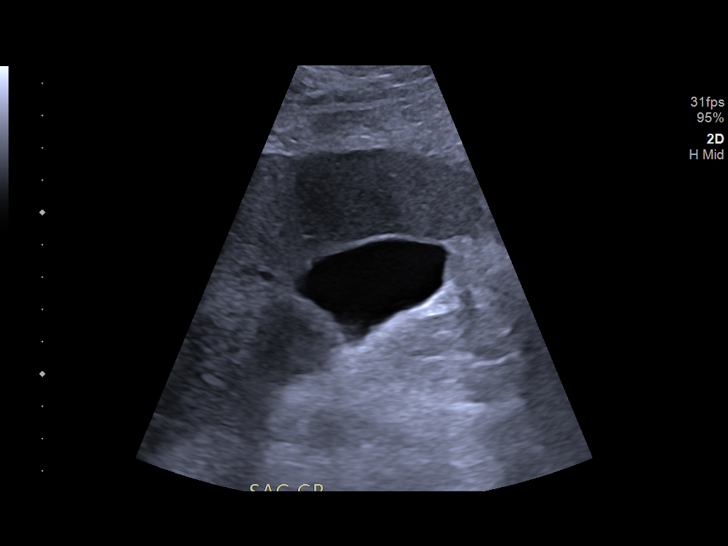
[im 55/102]
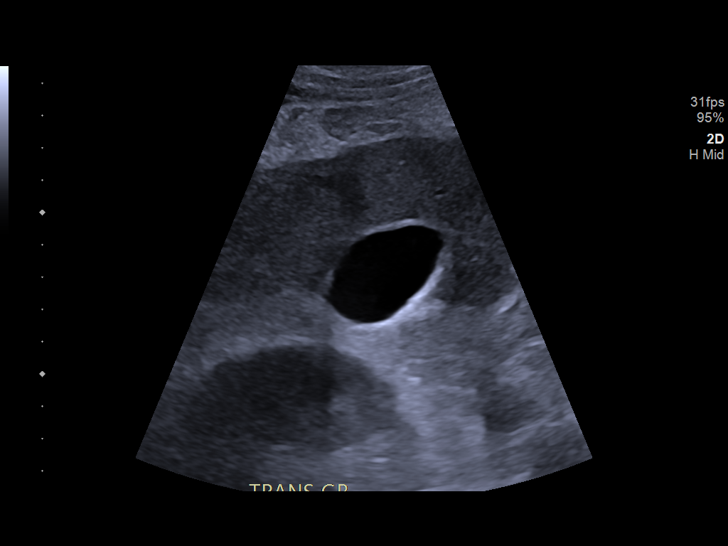
[im 64/102]
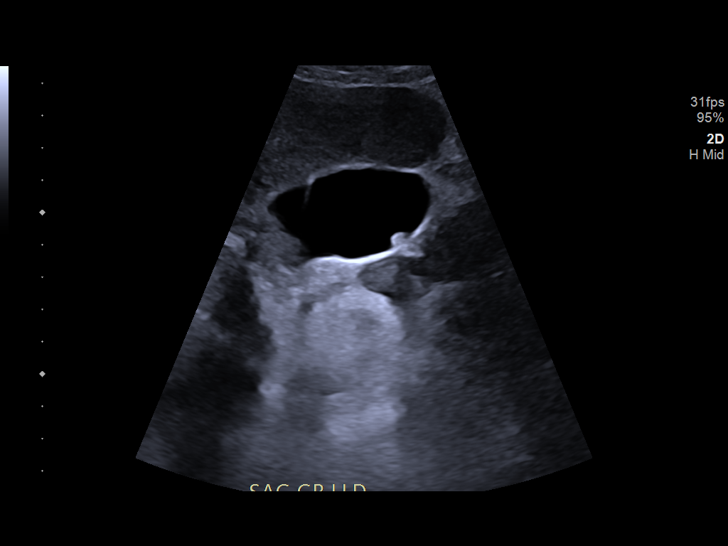
[im 68/102]
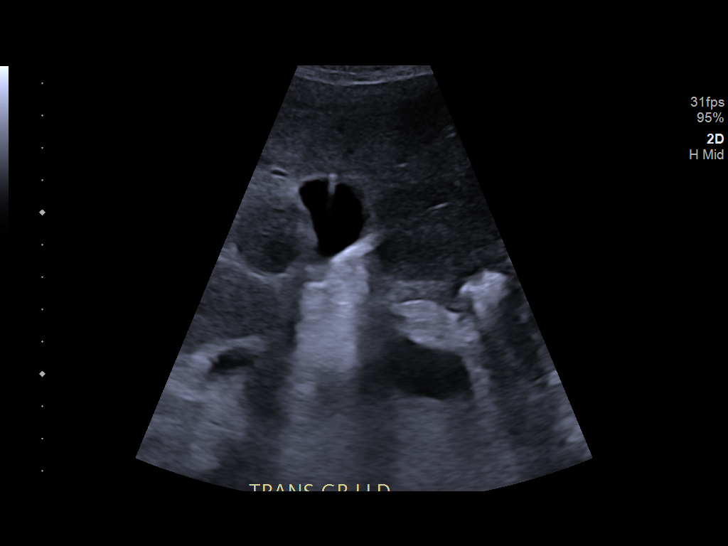
[im 76/102]
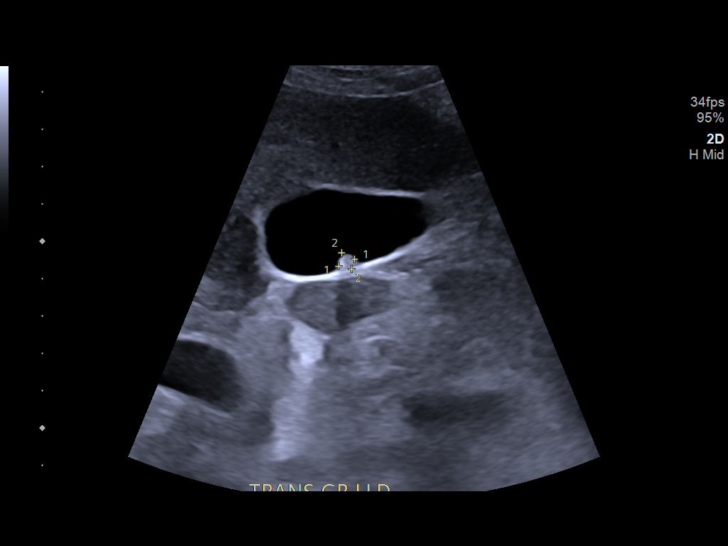
[im 85/102]
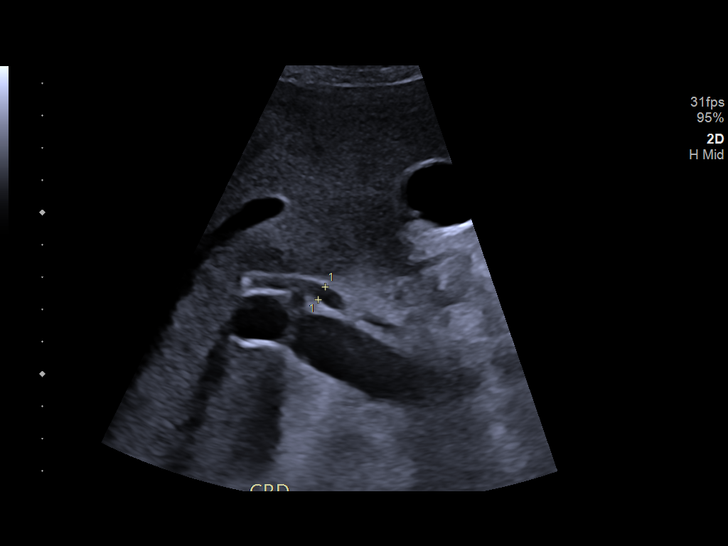
[im 93/102]
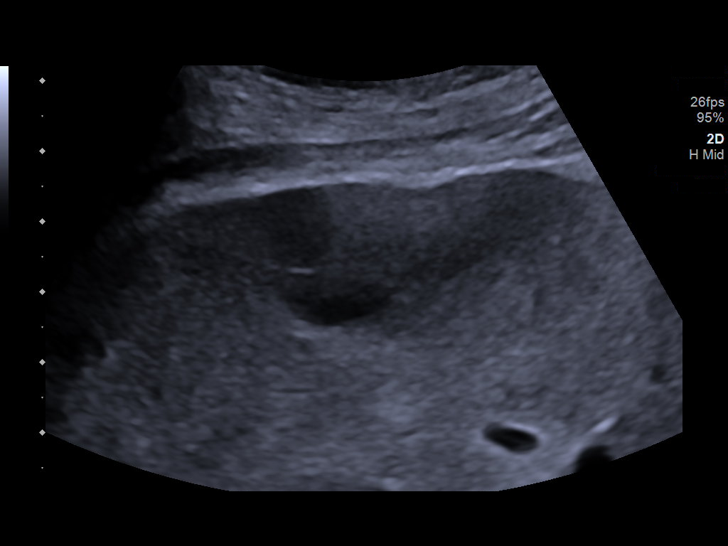
[im 102/102]
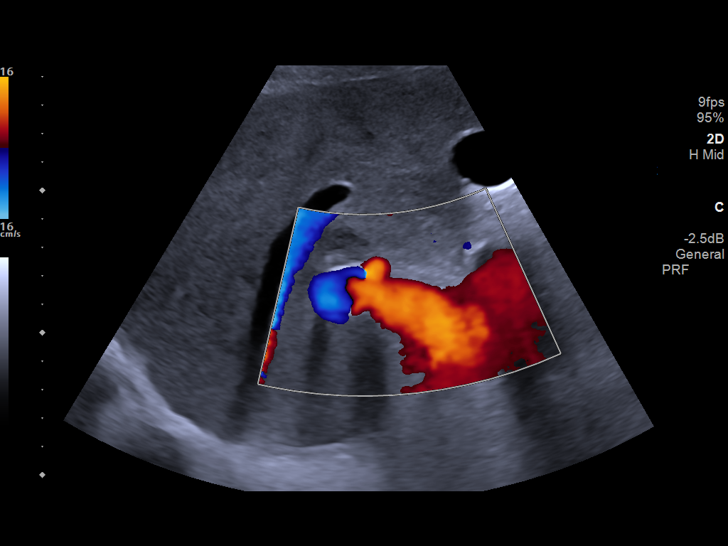

[14 of 25 positions shown; findings below may reference images not displayed]

FINDINGS: Gallbladder:

There is a 5 mm polyp in the gallbladder lumen. No follow-up needed.
No cholelithiasis. Negative sonographic Murphy's sign. No
gallbladder wall thickening.

Common bile duct:

Diameter: 5 mm

Liver:

Within the right hepatic lobe there is a 3.4 x 2.2 x 4.8 cm
hypoechoic masslike area. Increased echogenicity of the liver.
Portal vein is patent on color Doppler imaging with normal direction
of blood flow towards the liver.

Other: None.
IMPRESSION: There is a 4.8 cm hypoechoic mass within the right hepatic lobe.
This needs dedicated evaluation with pre and post contrast-enhanced
abdominal MRI for definitive characterization.

Increased hepatic parenchymal echogenicity suggestive of steatosis.

These results will be called to the ordering clinician or
representative by the Radiologist Assistant, and communication
documented in the PACS or [REDACTED].

## 2022-03-21 ENCOUNTER — Other Ambulatory Visit: Payer: Self-pay | Admitting: Internal Medicine

## 2022-03-21 ENCOUNTER — Other Ambulatory Visit (HOSPITAL_COMMUNITY): Payer: Self-pay | Admitting: Internal Medicine

## 2022-03-21 DIAGNOSIS — R160 Hepatomegaly, not elsewhere classified: Secondary | ICD-10-CM

## 2022-04-06 ENCOUNTER — Ambulatory Visit (HOSPITAL_COMMUNITY)
Admission: RE | Admit: 2022-04-06 | Discharge: 2022-04-06 | Disposition: A | Discharge status: Home / Self Care | Payer: No Typology Code available for payment source | Source: Ambulatory Visit | Attending: Internal Medicine | Admitting: Internal Medicine

## 2022-04-06 DIAGNOSIS — R160 Hepatomegaly, not elsewhere classified: Secondary | ICD-10-CM | POA: Diagnosis present

## 2022-04-06 IMAGING — MR MR ABDOMEN WO/W CM
23 series · 48 of 48 positions shown · IV contrast (gadavist)
Comparison: Abdominal ultrasound [DATE]

CLINICAL DATA: Liver mass

EXAM:
MRI ABDOMEN WITHOUT AND WITH CONTRAST
TECHNIQUE: Multiplanar multisequence MR imaging of the abdomen was performed
both before and after the administration of intravenous contrast.
CONTRAST:  8mL GADAVIST GADOBUTROL 1 MMOL/ML IV SOLN

[Series 6: T2 fat-sat · axial · 6.0mm · 1.19mm/px · 1 of 12 slices shown (1 of 2)]
[im 1/12]
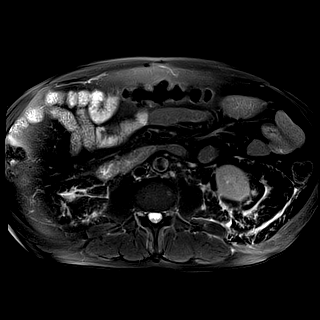

[Series 7: DWI · axial · 6.0mm · 1.42mm/px · 1 of 37 slices shown (1 of 4)]
[im 1/37]
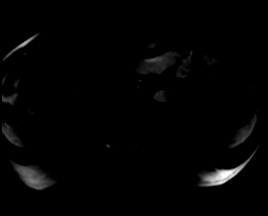

[Series 7: DWI · axial · 6.0mm · 1.42mm/px · 1 of 37 slices shown (2 of 4)]
[im 1/37]
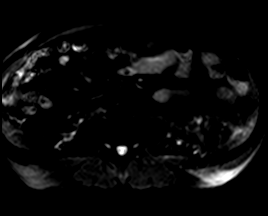

[Series 7: DWI · axial · 6.0mm · 1.42mm/px · 1 of 37 slices shown (3 of 4)]
[im 1/37]
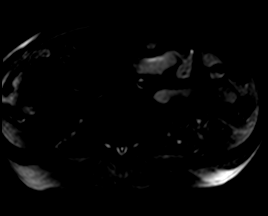

[Series 8: DWI · axial · 6.0mm · 1.42mm/px · 1 of 37 slices shown (4 of 4)]
[im 1/37]
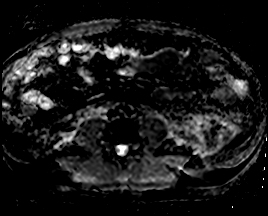

[Series 11: T2 fat-sat · axial · 6.0mm · 1.19mm/px · 1 of 39 slices shown (2 of 2)]
[im 1/39]
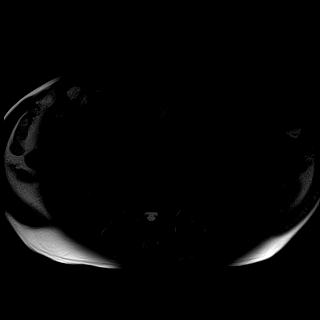

[Series 12: ax haste bh · axial · 6.0mm · 1.19mm/px · 1 of 40 slices shown]
[im 1/40]
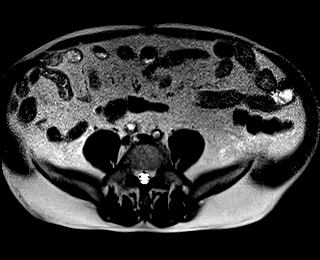

[Series 13: cor haste · coronal · 6.0mm · 1.25mm/px · 1 of 34 slices shown]
[im 1/34]
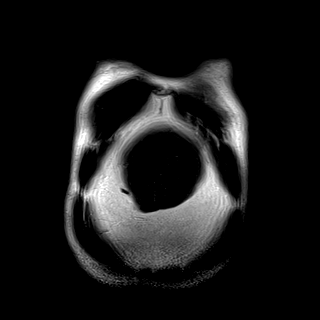

[Series 14: ax in and · axial · 3.3mm · 1.19mm/px · z∈[-186,+75]mm · 2 of 80 slices shown (1 of 2)]
[im 1/80]
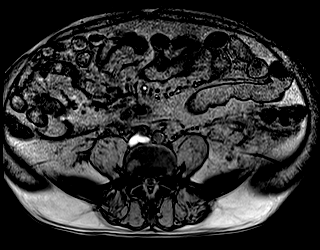
[im 80/80]
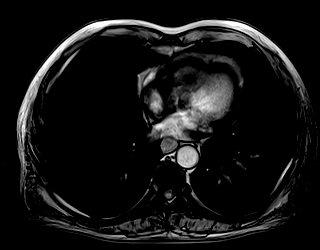

[Series 15: ax in and · axial · 3.3mm · 1.19mm/px · z∈[-186,+75]mm · 2 of 80 slices shown (2 of 2)]
[im 1/80]
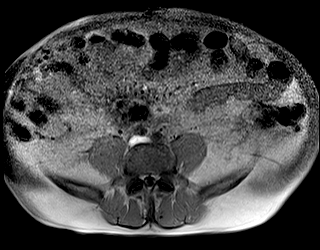
[im 80/80]
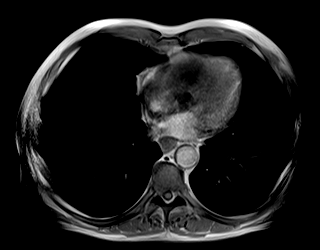

[Series 16: bSSFP · axial · 6.0mm · 0.74mm/px · 1 of 48 slices shown]
[im 1/48]
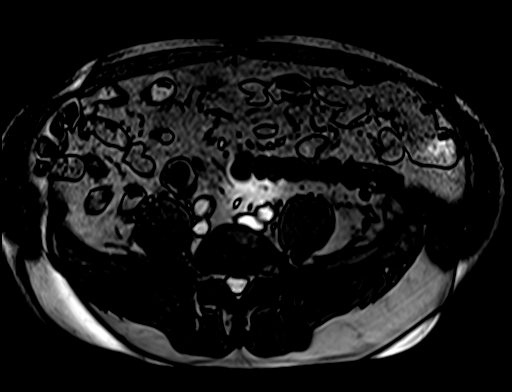

[Series 17: T1 dynamic · axial · 3.0mm · 1.19mm/px · z∈[-169,+68]mm · 2 of 80 slices shown]
[im 1/80]
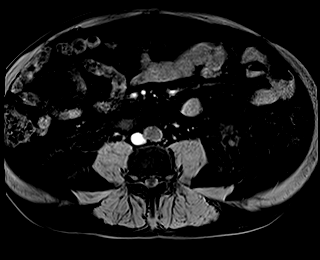
[im 80/80]
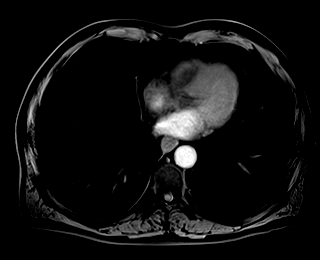

[Series 18: T1 dynamic post-contrast · axial · 3.0mm · 1.19mm/px · z∈[-169,+68]mm · 3 of 80 slices shown (1 of 11)]
[im 1/80]
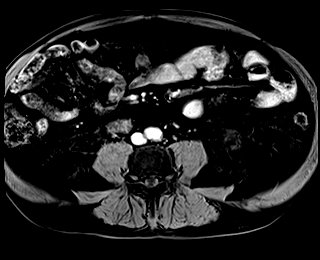
[im 40/80]
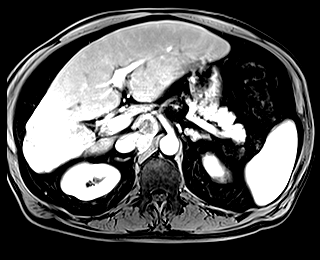
[im 80/80]
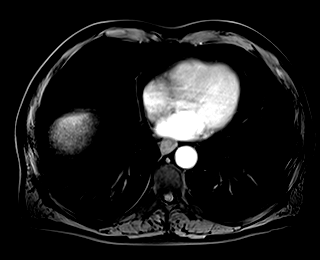

[Series 19: T1 dynamic post-contrast · axial · 3.0mm · 1.19mm/px · z∈[-169,+68]mm · 3 of 80 slices shown (2 of 11)]
[im 1/80]
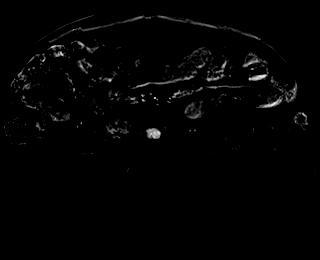
[im 40/80]
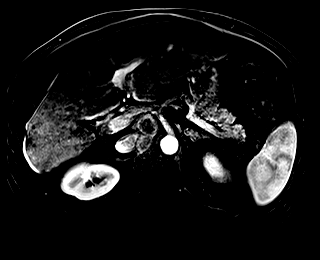
[im 80/80]
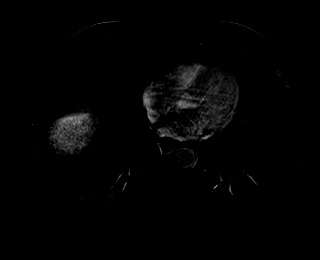

[Series 20: T1 dynamic post-contrast · axial · 3.0mm · 1.19mm/px · z∈[-169,+68]mm · 3 of 80 slices shown (3 of 11)]
[im 1/80]
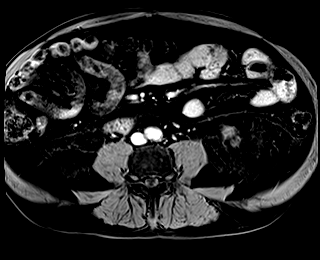
[im 40/80]
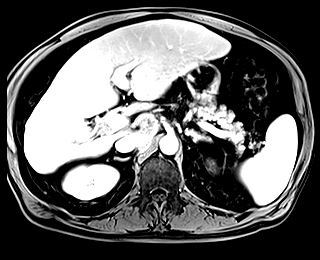
[im 80/80]
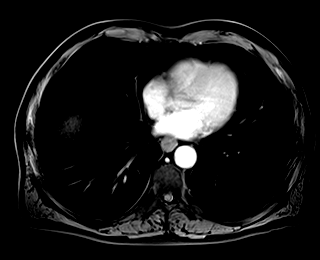

[Series 21: T1 dynamic post-contrast · axial · 3.0mm · 1.19mm/px · z∈[-169,+68]mm · 3 of 80 slices shown (4 of 11)]
[im 1/80]
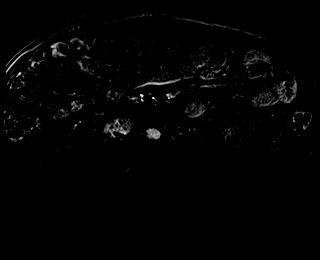
[im 40/80]
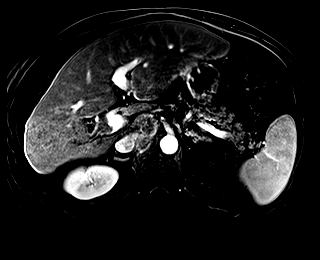
[im 80/80]
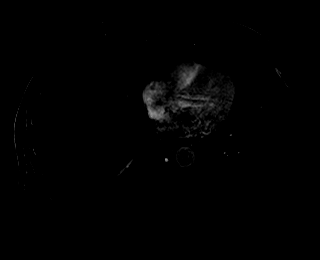

[Series 22: T1 dynamic post-contrast · axial · 3.0mm · 1.19mm/px · z∈[-169,+68]mm · 3 of 80 slices shown (5 of 11)]
[im 1/80]
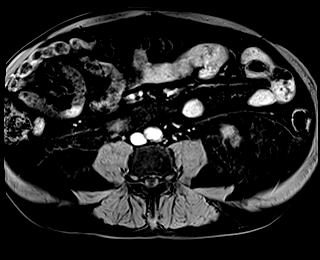
[im 40/80]
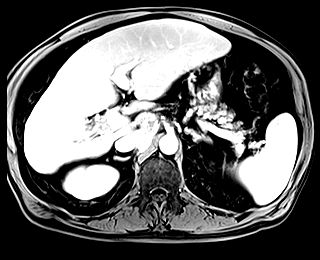
[im 80/80]
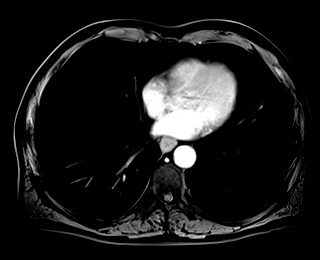

[Series 23: T1 dynamic post-contrast · axial · 3.0mm · 1.19mm/px · z∈[-169,+68]mm · 3 of 80 slices shown (6 of 11)]
[im 1/80]
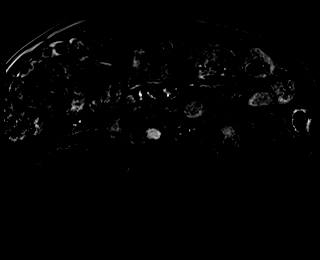
[im 40/80]
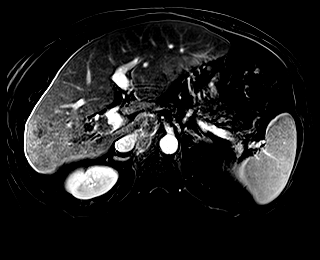
[im 80/80]
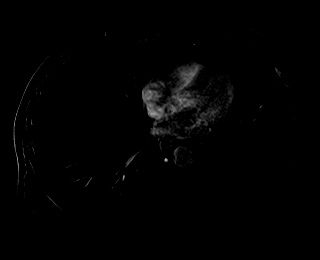

[Series 24: T1 dynamic post-contrast · coronal · 3.0mm · 1.31mm/px · 3 of 80 slices shown (7 of 11)]
[im 1/80]
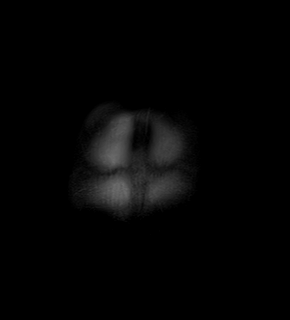
[im 40/80]
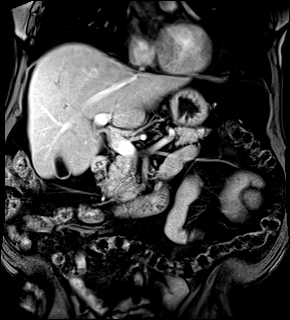
[im 80/80]
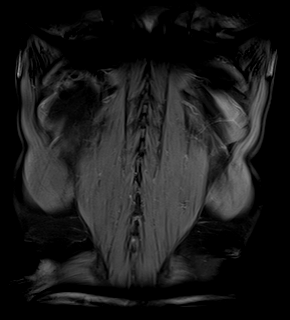

[Series 25: T1 dynamic post-contrast · axial · 3.0mm · 1.19mm/px · z∈[-169,+68]mm · 3 of 80 slices shown (8 of 11)]
[im 1/80]
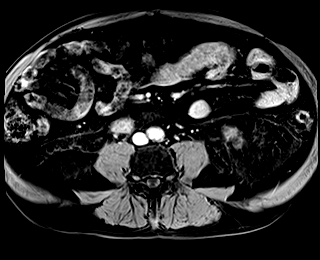
[im 40/80]
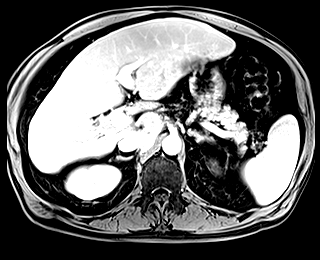
[im 80/80]
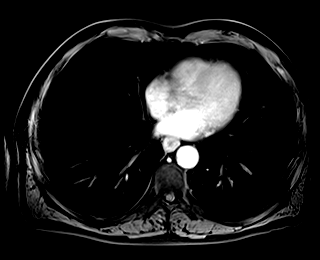

[Series 26: T1 dynamic post-contrast · axial · 3.0mm · 1.19mm/px · z∈[-169,+68]mm · 3 of 80 slices shown (9 of 11)]
[im 1/80]
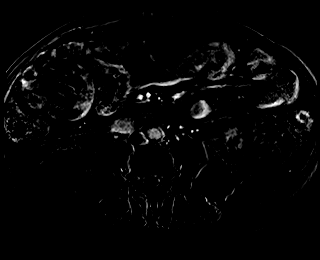
[im 40/80]
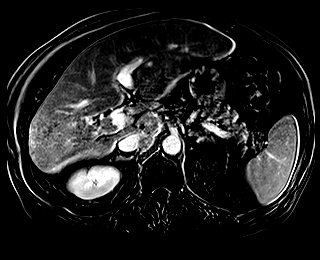
[im 80/80]
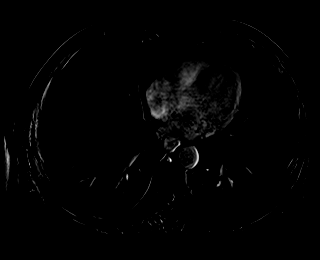

[Series 27: T1 dynamic post-contrast · axial · 3.0mm · 1.19mm/px · z∈[-169,+68]mm · 3 of 80 slices shown (10 of 11)]
[im 1/80]
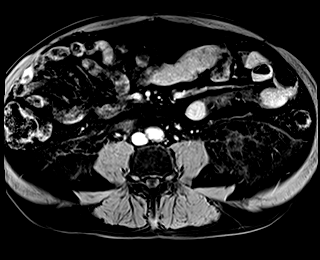
[im 40/80]
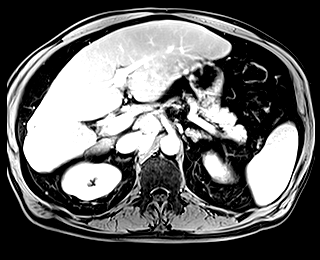
[im 80/80]
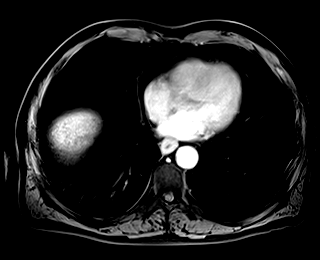

[Series 28: T1 dynamic post-contrast · axial · 3.0mm · 1.19mm/px · z∈[-169,+68]mm · 3 of 80 slices shown (11 of 11)]
[im 1/80]
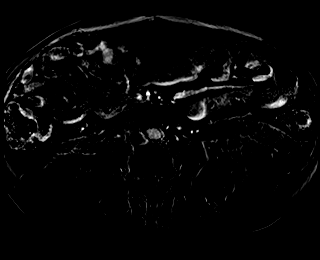
[im 40/80]
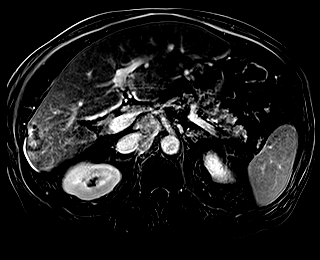
[im 80/80]
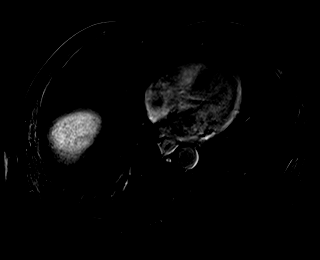

[48 of 48 positions shown; findings below may reference images not displayed]

FINDINGS: Lower chest: No acute findings.

Hepatobiliary: Large irregular and poorly defined mass visualized in
the right hepatic lobe centered at the dome measuring up to
approximately 11 x 12 x 8.5 cm in AP, transverse and craniocaudal
dimensions. Diffuse mildly increased T2 and increased DWI signal
with irregular surrounding/rim enhancement visualized with mostly
hypoenhancing appearance centrally. There are numerous surrounding
satellite masses throughout the right lobe measuring up to 3.2 cm in
segment 6. There is also mildly enhancing tumor thrombus visualized
throughout the right portal vein and its branches. There is mild
associated nodularity/capsular retraction throughout much of the
right hepatic lobe. No definite masses visualized in the left lobe
of the liver.

Small stone in the gallbladder.  No biliary ductal dilatation.

Pancreas: No mass, inflammatory changes, or other parenchymal
abnormality identified.

Spleen:  Enlarged measuring 14 cm in length.

Adrenals/Urinary Tract: Adrenal glands are within normal limits.
cm hemorrhagic cyst in the upper pole left kidney. A few small
simple renal cysts bilaterally. No hydronephrosis.

Stomach/Bowel: Colonic diverticulosis.  No bowel obstruction.

Vascular/Lymphatic: Mildly enhancing, hyperintense DWI signal tumor
thrombus in the right portal vein. Severe atherosclerotic disease.
Bulky enlarged portacaval lymph nodes largest is bilobed measuring
up to 3.9 x 3 cm, series 11, image 19.

Other:  Trace perihepatic ascites.

Musculoskeletal: 1.7 cm enhancing lesion in the L5 vertebral body
and 0.9 cm enhancing focus in the T11 vertebral body.
IMPRESSION: 1. Large irregular and poorly defined mass centered at the dome of
the right hepatic lobe which is consistent with malignancy, possibly
hepatocellular carcinoma, cholangiocarcinoma, or metastasis.
Numerous surrounding satellite tumors in the right hepatic lobe. No
definite mass visualized in the left lobe.
2. Associated tumor thrombus involving the right portal vein and its
branches.
3. Lymphadenopathy centered in the portacaval region which is likely
metastatic.
4. At least 2 enhancing bone lesions visualized, largest at L5,
highly suspicious for metastasis.
5. Splenomegaly.
6. Other ancillary findings as described.

## 2022-04-06 MED ORDER — GADOBUTROL 1 MMOL/ML IV SOLN
8.0000 mL | Freq: Once | INTRAVENOUS | Status: AC | PRN
  Administered 2022-04-06: 8 mL via INTRAVENOUS

## 2022-04-13 ENCOUNTER — Inpatient Hospital Stay (HOSPITAL_COMMUNITY): Payer: No Typology Code available for payment source | Attending: Hematology | Admitting: Hematology

## 2022-04-13 ENCOUNTER — Encounter (HOSPITAL_COMMUNITY): Payer: Self-pay | Admitting: Hematology

## 2022-04-13 ENCOUNTER — Inpatient Hospital Stay (HOSPITAL_COMMUNITY): Payer: No Typology Code available for payment source

## 2022-04-13 DIAGNOSIS — Z7984 Long term (current) use of oral hypoglycemic drugs: Secondary | ICD-10-CM | POA: Diagnosis not present

## 2022-04-13 DIAGNOSIS — E785 Hyperlipidemia, unspecified: Secondary | ICD-10-CM | POA: Insufficient documentation

## 2022-04-13 DIAGNOSIS — R160 Hepatomegaly, not elsewhere classified: Secondary | ICD-10-CM | POA: Diagnosis present

## 2022-04-13 DIAGNOSIS — Z800 Family history of malignant neoplasm of digestive organs: Secondary | ICD-10-CM | POA: Insufficient documentation

## 2022-04-13 DIAGNOSIS — K769 Liver disease, unspecified: Secondary | ICD-10-CM | POA: Diagnosis not present

## 2022-04-13 DIAGNOSIS — D376 Neoplasm of uncertain behavior of liver, gallbladder and bile ducts: Secondary | ICD-10-CM | POA: Insufficient documentation

## 2022-04-13 DIAGNOSIS — I10 Essential (primary) hypertension: Secondary | ICD-10-CM | POA: Diagnosis not present

## 2022-04-13 DIAGNOSIS — R110 Nausea: Secondary | ICD-10-CM | POA: Diagnosis not present

## 2022-04-13 DIAGNOSIS — E119 Type 2 diabetes mellitus without complications: Secondary | ICD-10-CM | POA: Insufficient documentation

## 2022-04-13 DIAGNOSIS — R5383 Other fatigue: Secondary | ICD-10-CM | POA: Diagnosis not present

## 2022-04-13 DIAGNOSIS — M549 Dorsalgia, unspecified: Secondary | ICD-10-CM | POA: Insufficient documentation

## 2022-04-13 DIAGNOSIS — Z794 Long term (current) use of insulin: Secondary | ICD-10-CM | POA: Insufficient documentation

## 2022-04-13 DIAGNOSIS — E079 Disorder of thyroid, unspecified: Secondary | ICD-10-CM | POA: Insufficient documentation

## 2022-04-13 DIAGNOSIS — F17210 Nicotine dependence, cigarettes, uncomplicated: Secondary | ICD-10-CM | POA: Insufficient documentation

## 2022-04-13 DIAGNOSIS — Z79899 Other long term (current) drug therapy: Secondary | ICD-10-CM | POA: Diagnosis not present

## 2022-04-13 LAB — COMPREHENSIVE METABOLIC PANEL
ALT: 36 U/L (ref 0–44)
AST: 47 U/L — ABNORMAL HIGH (ref 15–41)
Albumin: 3.7 g/dL (ref 3.5–5.0)
Alkaline Phosphatase: 295 U/L — ABNORMAL HIGH (ref 38–126)
Anion gap: 9 (ref 5–15)
BUN: 27 mg/dL — ABNORMAL HIGH (ref 8–23)
CO2: 24 mmol/L (ref 22–32)
Calcium: 9.4 mg/dL (ref 8.9–10.3)
Chloride: 97 mmol/L — ABNORMAL LOW (ref 98–111)
Creatinine, Ser: 1.22 mg/dL (ref 0.61–1.24)
GFR, Estimated: >60 mL/min (ref >60)
Glucose, Bld: 163 mg/dL — ABNORMAL HIGH (ref 70–99)
Potassium: 5.5 mmol/L — ABNORMAL HIGH (ref 3.5–5.1)
Sodium: 130 mmol/L — ABNORMAL LOW (ref 135–145)
Total Bilirubin: 0.6 mg/dL (ref 0.3–1.2)
Total Protein: 8.1 g/dL (ref 6.5–8.1)

## 2022-04-13 LAB — CBC WITH DIFFERENTIAL/PLATELET
Abs Immature Granulocytes: 0.43 10*3/uL — ABNORMAL HIGH (ref 0.00–0.07)
Basophils Absolute: 0.1 10*3/uL (ref 0.0–0.1)
Basophils Relative: 1 %
Eosinophils Absolute: 0.2 10*3/uL (ref 0.0–0.5)
Eosinophils Relative: 1 %
HCT: 39.7 % (ref 39.0–52.0)
Hemoglobin: 13.3 g/dL (ref 13.0–17.0)
Immature Granulocytes: 3 %
Lymphocytes Relative: 8 %
Lymphs Abs: 1.2 10*3/uL (ref 0.7–4.0)
MCH: 29.6 pg (ref 26.0–34.0)
MCHC: 33.5 g/dL (ref 30.0–36.0)
MCV: 88.4 fL (ref 80.0–100.0)
Monocytes Absolute: 1.6 10*3/uL — ABNORMAL HIGH (ref 0.1–1.0)
Monocytes Relative: 10 %
Neutro Abs: 12 10*3/uL — ABNORMAL HIGH (ref 1.7–7.7)
Neutrophils Relative %: 77 %
Platelets: 395 10*3/uL (ref 150–400)
RBC: 4.49 MIL/uL (ref 4.22–5.81)
RDW: 12.8 % (ref 11.5–15.5)
WBC: 15.5 10*3/uL — ABNORMAL HIGH (ref 4.0–10.5)
nRBC: 0 % (ref 0.0–0.2)

## 2022-04-13 LAB — HEPATITIS B SURFACE ANTIBODY,QUALITATIVE: Hep B S Ab: NONREACTIVE

## 2022-04-13 LAB — BILIRUBIN, DIRECT: Bilirubin, Direct: 0.1 mg/dL (ref 0.0–0.2)

## 2022-04-13 LAB — LACTATE DEHYDROGENASE: LDH: 547 U/L — ABNORMAL HIGH (ref 98–192)

## 2022-04-13 LAB — HEPATITIS B CORE ANTIBODY, TOTAL: Hep B Core Total Ab: NONREACTIVE

## 2022-04-13 LAB — HEPATITIS B SURFACE ANTIGEN: Hepatitis B Surface Ag: NONREACTIVE

## 2022-04-13 LAB — HEPATITIS C ANTIBODY: HCV Ab: NONREACTIVE

## 2022-04-13 NOTE — Progress Notes (Signed)
[facility] [address], [place] [address]   CLINIC:  Medical Oncology/Hematology  CONSULT NOTE  Patient Care Team: Eber Hong, MD as PCP - General (Internal Medicine) Derek Jack, MD as Medical Oncologist (Medical Oncology) Brien Mates, RN as Oncology Nurse Navigator (Medical Oncology)  CHIEF COMPLAINTS/PURPOSE OF CONSULTATION:  Evaluation for right hepatic lobe mass  HISTORY OF PRESENTING ILLNESS:  [name] 71 y.o. male is here because of evaluation for right hepatic lobe mass, at the request of Dr. Brynda Greathouse.  Today he reports feeling well. He reports fatigue starting over the past 6 months. His appetite had decreased, and he reports losing 20 lbs over the past 6 months. He had Covid in September 2022 and a CVA 1 month following his Covid infection. He had never had any other CVA prior. He reports constant right abdominal pain which radiates to his back and up to his chest; this pain also started 6 months ago. He is currently taking 1/2 tablets NORCO 3-4 times daily as well as Icy-Hot spray for this pain which he reports helps slightly. He denies constipation. He reports occasional nausea. He spends most of the day sitting on his couch due to pain and fatigue following his typical daily activities. He denies history of hepatitis or blood transfusions. He has been on Plavix since his CVA in October 2022. He denies vision changes and headaches. He denies history of MI. He reports occasional cough. He reports back pain medial to right scapula. He expresses a preference against chemotherapy at this time.   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 He currently smokes 2 ppd and has smoked for 50 years. He smoked marijuana for 30 years. He reports he previously drank alcohol regularly on the weekends, but he doesn't currently drink. His mother had colon cancer, and he otherwise denies family history of cancer.   MEDICAL HISTORY:  Past Medical  History:  Diagnosis Date   Diabetes mellitus without complication ([place])    Hyperlipemia    Hypertension    Renal disorder    Stroke [facility])    Thyroid disease     SURGICAL HISTORY: Past Surgical History:  Procedure Laterality Date   CARDIAC CATHETERIZATION     CARPAL TUNNEL RELEASE     HAND TENDON SURGERY      SOCIAL HISTORY: Social History   Socioeconomic History   Marital status: Married    Spouse name: Not on file   Number of children: Not on file   Years of education: Not on file   Highest education level: Not on file  Occupational History   Not on file  Tobacco Use   Smoking status: Every Day    Packs/day: 2.00    Types: Cigarettes   Smokeless tobacco: Never  Substance and Sexual Activity   Alcohol use: Yes    Alcohol/week: 1.0 standard drink    Types: 1 Cans of beer per week    Comment: occasionally    Drug use: No   Sexual activity: Not on file  Other Topics Concern   Not on file  Social History Narrative   Not on file   Social Determinants of Health   Financial Resource Strain: Not on file  Food Insecurity: Not on file  Transportation Needs: Not on file  Physical Activity: Not on file  Stress: Not on file  Social Connections: Not on file  Intimate Partner Violence: Not on file    FAMILY HISTORY: Family History  Problem Relation Age of Onset   Colon polyps Mother    Colon cancer Mother    Esophageal cancer Neg Hx    Rectal cancer Neg Hx    Stomach cancer Neg Hx     ALLERGIES:  has No Known Allergies.  MEDICATIONS:  Current Outpatient Medications  Medication Sig Dispense Refill   atorvastatin (LIPITOR) 20 MG tablet Take 20 mg by mouth every morning.     clopidogrel (PLAVIX) 75 MG tablet Take 1 tablet (75 mg total) by mouth daily. 30 tablet 11   famotidine (PEPCID) 20 MG tablet Take 20 mg by mouth daily.     hydrochlorothiazide (HYDRODIURIL) 25 MG tablet Take 25 mg by mouth every morning.     Insulin Pen Needle (B-D UF III MINI PEN  NEEDLES) 31G X 5 MM MISC USE EVERY DAY     irbesartan (AVAPRO) 300 MG tablet Take 1 tablet by mouth daily.     levothyroxine (SYNTHROID, LEVOTHROID) 50 MCG tablet Take 50 mcg by mouth every morning.     Magnesium Oxide 400 MG CAPS Take 1 capsule by mouth 2 (two) times daily.     metFORMIN (GLUCOPHAGE) 1000 MG tablet Take 1,000 mg by mouth 2 (two) times daily with a meal.     Multiple Vitamin (MULTIVITAMIN WITH MINERALS) TABS Take 1 tablet by mouth daily.     omega-3 acid ethyl esters (LOVAZA) 1 G capsule Take 2 g by mouth 2 (two) times daily.     sitaGLIPtin (JANUVIA) 100 MG tablet Take 100 mg by mouth daily.     valsartan (DIOVAN) 160 MG tablet Take 160 mg by mouth daily.     VICTOZA 18 MG/3ML SOPN Inject 1.8 mg into the skin every evening.  4   amLODipine (NORVASC) 2.5 MG tablet SMARTSIG:1 Tablet(s) By Mouth Every Evening     Continuous Blood Gluc Sensor (DEXCOM G6 SENSOR) MISC Apply topically daily.     HYDROcodone-acetaminophen (NORCO) 10-325 MG tablet Take 0.5 tablets by mouth 3 (three) times daily as needed.     ONETOUCH VERIO test strip daily.     No current facility-administered medications for this visit.    REVIEW OF SYSTEMS:   Review of Systems  Constitutional:  Positive for fatigue and unexpected weight change (-20 lbs). Negative for appetite change.  HENT:   Positive for trouble swallowing.   Eyes:  Negative for eye problems.  Respiratory:  Positive for cough and shortness of breath.   Cardiovascular:  Positive for chest pain.  Gastrointestinal:  Positive for abdominal pain (R side), diarrhea and nausea. Negative for constipation.  Genitourinary:  Positive for frequency.   Musculoskeletal:  Positive for arthralgias (6/10 shoulder) and back pain.  Neurological:  Negative for headaches.  Psychiatric/Behavioral:  Positive for depression.   All other systems reviewed and are negative.   PHYSICAL EXAMINATION: ECOG PERFORMANCE STATUS: 1 - Symptomatic but completely  ambulatory  There were no vitals filed for this visit. There were no vitals filed for this visit. Physical Exam Vitals reviewed.  Constitutional:      Appearance: Normal appearance.  Cardiovascular:     Rate and Rhythm: Normal rate and regular rhythm.     Pulses: Normal pulses.     Heart sounds: Normal heart sounds.  Pulmonary:     Effort: Pulmonary effort is normal.     Breath sounds: Normal breath sounds.  Abdominal:     Palpations: Abdomen is soft. There is no mass.     Tenderness: There is no  abdominal tenderness.  Musculoskeletal:     Right lower leg: No edema.     Left lower leg: No edema.  Neurological:     General: No focal deficit present.     Mental Status: He is alert and oriented to person, place, and time.  Psychiatric:        Mood and Affect: Mood normal.        Behavior: Behavior normal.     LABORATORY DATA:  I have reviewed the data as listed    Latest Ref Rng & Units 04/13/2022    9:27 AM 09/14/2021    5:26 AM 09/13/2021    5:38 AM  CBC  WBC 4.0 - 10.5 K/uL 15.5   10.0   10.4    Hemoglobin 13.0 - 17.0 g/dL 13.3   13.7   13.3    Hematocrit 39.0 - 52.0 % 39.7   39.5   38.0    Platelets 150 - 400 K/uL 395   236   242        Latest Ref Rng & Units 04/13/2022    9:27 AM 09/14/2021    5:26 AM 09/13/2021    5:38 AM  CMP  Glucose 70 - 99 mg/dL 163   220   231    BUN 8 - 23 mg/dL 27   31   33    Creatinine 0.61 - 1.24 mg/dL 1.22   1.07   1.05    Sodium 135 - 145 mmol/L 130   133   132    Potassium 3.5 - 5.1 mmol/L 5.5   4.3   4.2    Chloride 98 - 111 mmol/L 97   103   101    CO2 22 - 32 mmol/L '24   23   24    '$ Calcium 8.9 - 10.3 mg/dL 9.4   9.1   9.4    Total Protein 6.5 - 8.1 g/dL 8.1    6.6    Total Bilirubin 0.3 - 1.2 mg/dL 0.6    0.3    Alkaline Phos 38 - 126 U/L 295    85    AST 15 - 41 U/L 47    24    ALT 0 - 44 U/L 36    38      RADIOGRAPHIC STUDIES: I have personally reviewed the radiological images as listed and agreed with the findings  in the report. MR ABDOMEN WWO CONTRAST  Result Date: 04/07/2022 CLINICAL DATA:  Liver mass EXAM: MRI ABDOMEN WITHOUT AND WITH CONTRAST TECHNIQUE: Multiplanar multisequence MR imaging of the abdomen was performed both before and after the administration of intravenous contrast. CONTRAST:  39m GADAVIST GADOBUTROL 1 MMOL/ML IV SOLN COMPARISON:  Abdominal ultrasound 03/20/2022 FINDINGS: Lower chest: No acute findings. Hepatobiliary: Large irregular and poorly defined mass visualized in the right hepatic lobe centered at the dome measuring up to approximately 11 x 12 x 8.5 cm in AP, transverse and craniocaudal dimensions. Diffuse mildly increased T2 and increased DWI signal with irregular surrounding/rim enhancement visualized with mostly hypoenhancing appearance centrally. There are numerous surrounding satellite masses throughout the right lobe measuring up to 3.2 cm in segment 6. There is also mildly enhancing tumor thrombus visualized throughout the right portal vein and its branches. There is mild associated nodularity/capsular retraction throughout much of the right hepatic lobe. No definite masses visualized in the left lobe of the liver. Small stone in the gallbladder.  No biliary ductal dilatation. Pancreas: No mass, inflammatory changes, or  other parenchymal abnormality identified. Spleen:  Enlarged measuring 14 cm in length. Adrenals/Urinary Tract: Adrenal glands are within normal limits. 1.2 cm hemorrhagic cyst in the upper pole left kidney. A few small simple renal cysts bilaterally. No hydronephrosis. Stomach/Bowel: Colonic diverticulosis.  No bowel obstruction. Vascular/Lymphatic: Mildly enhancing, hyperintense DWI signal tumor thrombus in the right portal vein. Severe atherosclerotic disease. Bulky enlarged portacaval lymph nodes largest is bilobed measuring up to 3.9 x 3 cm, series 11, image 19. Other:  Trace perihepatic ascites. Musculoskeletal: 1.7 cm enhancing lesion in the L5 vertebral body and  0.9 cm enhancing focus in the T11 vertebral body. IMPRESSION: 1. Large irregular and poorly defined mass centered at the dome of the right hepatic lobe which is consistent with malignancy, possibly hepatocellular carcinoma, cholangiocarcinoma, or metastasis. 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 No definite mass visualized in the left lobe. 2. Associated tumor thrombus involving the right portal vein and its branches. 3. Lymphadenopathy centered in the portacaval region which is likely metastatic. 4. At least 2 enhancing bone lesions visualized, largest at L5, highly suspicious for metastasis. 5. Splenomegaly. 6. Other ancillary findings as described. Electronically Signed   By: Ofilia Neas M.D.   On: 04/07/2022 16:05   US ABDOMEN LIMITED RUQ (LIVER/GB)  Result Date: 03/20/2022 CLINICAL DATA:  Elevated alkaline phosphatase EXAM: ULTRASOUND ABDOMEN LIMITED RIGHT UPPER QUADRANT COMPARISON:  None. FINDINGS: Gallbladder: There is a 5 mm polyp in the gallbladder lumen. No follow-up needed. No cholelithiasis. Negative sonographic Murphy's sign. No gallbladder wall thickening. Common bile duct: Diameter: 5 mm Liver: Within the right hepatic lobe there is a 3.4 x 2.2 x 4.8 cm hypoechoic masslike area. Increased echogenicity of the liver. Portal vein is patent on color Doppler imaging with normal direction of blood flow towards the liver. Other: None. IMPRESSION: There is a 4.8 cm hypoechoic mass within the right hepatic lobe. This needs dedicated evaluation with pre and post contrast-enhanced abdominal MRI for definitive characterization. Increased hepatic parenchymal echogenicity suggestive of steatosis. These results will be called to the ordering clinician or representative by the Radiologist Assistant, and communication documented in the PACS or [employer]. Electronically Signed   By: Lovey Newcomer M.D.   On: 03/20/2022 16:39    ASSESSMENT:   Right hepatic lobe mass,  portacaval lymphadenopathy and bone lesions: - Weight loss: 20 pounds in 6 months, decreased appetite - Reports helped going downhill since I had COVID in September 2022, 1 month later had stroke. - Elevated alkaline phosphatase noted by Dr. Brynda Greathouse - Ultrasound abdomen (03/20/2022): Right hepatic mass - MRI abdomen (04/06/2022): 11 x 12 x 8.5 cm large irregular poorly defined mass at the dome of the right hepatic lobe with numerous satellite tumors in the right hepatic lobe.  Associated tumor thrombus involving right portal vein and its branches.  Lymphadenopathy in the portacaval region.  2 enhancing bone lesions at L5 and T11 vertebral body.  Splenomegaly measuring 14 cm.   Social/family history: - Lives at home with his wife.  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 - 2 pack/day smoker for last 50 years. - Mother died of colon cancer.   PLAN:   Right hepatic lobe mass, portacaval lymphadenopathy and bone lesions: - I have reviewed images of the MRI and discussed differential diagnosis. - Recommend whole-body PET CT scan.  We will do labs including hepatitis panel, AFP, CEA, CA 19-9 and LDH. - Recommend biopsy of the liver lesion. - RTC  after biopsy.  2.  Right-sided chest wall pain: - Reports right posterior rib pain and pain medial to the medial margin of the right scapula which is constant. - Currently taking HC 10/325 2 tablet 4-5 times daily.  Also using IcyHot spray which helps. - Suggested to increase HC 10/325, 1 tablet every 4 hours.  If no improvement, will switch to oxycodone and long-acting fentanyl patch.  Patient's wife will call as after increasing hydrocodone dose.  Addendum: His potassium was found to be elevated at 5.5.  Irbesartan could be contributing to his hyperkalemia. - We will call Kayexalate 1 dose.   All questions were answered. The patient knows to call the clinic with any problems, questions or concerns.   Derek Jack,  MD, 04/13/22 6:11 PM  [facility] [phone]   I, Thana Ates, am acting as a scribe for Dr. Derek Jack.  I, Derek Jack MD, have reviewed the above documentation for accuracy and completeness, and I agree with the above.

## 2022-04-13 NOTE — Progress Notes (Unsigned)
Bryce Daft, MD  [name] D Schedule for US guided liver biopsy.  Need to find out if Plavix can be held.   [place]

## 2022-04-13 NOTE — Patient Instructions (Addendum)
[place] at [facility] Discharge Instructions  You were seen and examined today by Dr. Delton Coombes. Dr. Delton Coombes is a medical oncologist, meaning that he specializes in the treatment of cancer diagnoses. Dr. Delton Coombes discussed your past medical history, family history of cancers, and the events that led to you being here today.  You were referred to Dr. Delton Coombes by Dr. Brynda Greathouse due to an abnormal MRI and Ultrasound of your abdomen. It appears that your liver has cancer in it. It is unclear if the cancer arose in the liver or elsewhere. On the MRI, there were also areas of concern noted with enlarged lymph nodes and two lesions in one of your vertebrae.  Dr. Delton Coombes has recommended a PET scan. A PET scan is a specialized CT scan that illuminates where there is cancer presence within the body. This will also identify the current stage of cancer. It could also help identify the location in which the cancer started.  Dr. Delton Coombes has also recommended a biopsy of the liver lesion. This can be done by the radiologist in [place]. This, ideally, will accurately identify the exact type of cancer.  If your pain is uncontrolled, please take one whole tablet of the Hydrocodone-Acetaminophen 10-'325mg'$  every 6 hours when needed. If this helps, please call us for the refill. If it is still not controlling your pain, please call us and let us know that as well.  For questions or concerns, please call [employer] quality scientist) at [phone].  Follow-up with Dr. Delton Coombes 1 week after the biopsy.   Thank you for choosing [place] at [facility] to provide your oncology and hematology care.  To afford each patient quality time with our provider, please arrive at least 15 minutes before your scheduled appointment time.   If you have a lab appointment with the [facility] please come in thru the Main Entrance and check in at the main information  desk.  You need to re-schedule your appointment should you arrive 10 or more minutes late.  We strive to give you quality time with our providers, and arriving late affects you and other patients whose appointments are after yours.  Also, if you no show three or more times for appointments you may be dismissed from the clinic at the providers discretion.     Again, thank you for choosing [facility].  Our hope is that these requests will decrease the amount of time that you wait before being seen by our physicians.       _____________________________________________________________  Should you have questions after your visit to [facility], please contact our office at [phone] and follow the prompts.  Our office hours are 8:00 a.m. and 4:30 p.m. Monday - Friday.  Please note that voicemails left after 4:00 p.m. may not be returned until the following business day.  We are closed weekends and major holidays.  You do have access to a nurse 24-7, just call the main number to the clinic [phone] and do not press any options, hold on the line and a nurse will answer the phone.    For prescription refill requests, have your pharmacy contact our office and allow 72 hours.    Due to Covid, you will need to wear a mask upon entering the hospital. If you do not have a mask, a mask will be given to you at the Main Entrance upon arrival. For doctor visits, patients may have  1 support person age 75 or older with them. For treatment visits, patients can not have anyone with them due to social distancing guidelines and our immunocompromised population.

## 2022-04-14 ENCOUNTER — Other Ambulatory Visit: Payer: Self-pay | Admitting: *Deleted

## 2022-04-14 LAB — CEA: CEA: 57 ng/mL — ABNORMAL HIGH (ref 0.0–4.7)

## 2022-04-14 LAB — CANCER ANTIGEN 19-9: CA 19-9: 125 U/mL — ABNORMAL HIGH (ref 0–35)

## 2022-04-14 LAB — AFP TUMOR MARKER: AFP, Serum, Tumor Marker: 77906 ng/mL — ABNORMAL HIGH (ref 0.0–8.4)

## 2022-04-14 MED ORDER — SODIUM POLYSTYRENE SULFONATE 15 GM/60ML PO SUSP: 60.0000 g | Freq: Once | ORAL | 0 refills | Status: AC

## 2022-04-14 NOTE — Progress Notes (Signed)
Per Dr. Delton Coombes, I called patient to advise of potassium of 5.5 and instructed that we would send in Kayexalate 60 grams for him to take today and advised to be near the bathroom for the remainder of the day.  Verbalized understanding.

## 2022-04-17 ENCOUNTER — Other Ambulatory Visit (HOSPITAL_COMMUNITY): Payer: Self-pay

## 2022-04-17 MED ORDER — OXYCODONE HCL 10 MG PO TABS: 10.0000 mg | ORAL_TABLET | ORAL | 0 refills | Status: DC | PRN

## 2022-04-18 ENCOUNTER — Other Ambulatory Visit (HOSPITAL_COMMUNITY): Payer: Self-pay

## 2022-04-18 ENCOUNTER — Ambulatory Visit (HOSPITAL_COMMUNITY): Payer: No Typology Code available for payment source | Admitting: Hematology

## 2022-04-18 MED ORDER — OXYCODONE HCL 10 MG PO TABS: 10.0000 mg | ORAL_TABLET | ORAL | 0 refills | Status: DC | PRN

## 2022-04-19 ENCOUNTER — Other Ambulatory Visit (HOSPITAL_COMMUNITY): Payer: Self-pay | Admitting: *Deleted

## 2022-04-19 ENCOUNTER — Encounter (HOSPITAL_COMMUNITY): Payer: Self-pay | Admitting: *Deleted

## 2022-04-19 MED ORDER — HYDROCODONE-ACETAMINOPHEN 10-325 MG PO TABS: 1.0000 | ORAL_TABLET | Freq: Four times a day (QID) | ORAL | 0 refills | Status: DC | PRN

## 2022-04-19 MED ORDER — FENTANYL 50 MCG/HR TD PT72: 1.0000 | MEDICATED_PATCH | TRANSDERMAL | 0 refills | Status: DC

## 2022-04-19 NOTE — Progress Notes (Signed)
Approval received from [facility] for Fentanyl patches from 04/19/22-04/19/23.  Copy scanned to Epic.

## 2022-04-20 ENCOUNTER — Other Ambulatory Visit (HOSPITAL_COMMUNITY): Payer: Self-pay

## 2022-04-20 ENCOUNTER — Encounter (HOSPITAL_COMMUNITY)
Admission: RE | Admit: 2022-04-20 | Discharge: 2022-04-20 | Disposition: A | Discharge status: Home / Self Care | Payer: No Typology Code available for payment source | Source: Ambulatory Visit | Attending: Hematology | Admitting: Hematology

## 2022-04-20 DIAGNOSIS — K769 Liver disease, unspecified: Secondary | ICD-10-CM | POA: Diagnosis present

## 2022-04-20 IMAGING — PT NM PET TUM IMG INITIAL (PI) SKULL BASE T - THIGH
9 series · 25 of 25 positions shown · non-contrast
Comparison: Abdominal MRI [DATE]

CLINICAL DATA: Initial treatment strategy for liver mass.

EXAM:
NUCLEAR MEDICINE PET SKULL BASE TO THIGH
TECHNIQUE: 9.3 mCi F-18 FDG was injected intravenously. Full-ring PET imaging
was performed from the skull base to thigh after the radiotracer. CT
data was obtained and used for attenuation correction and anatomic
localization.
Fasting blood glucose: 170 mg/dl

[Series 3: ctac · axial · 3.0mm · 0.98mm/px · z∈[-984,-18]mm · 4 of 323 slices shown]
[im 1/323]
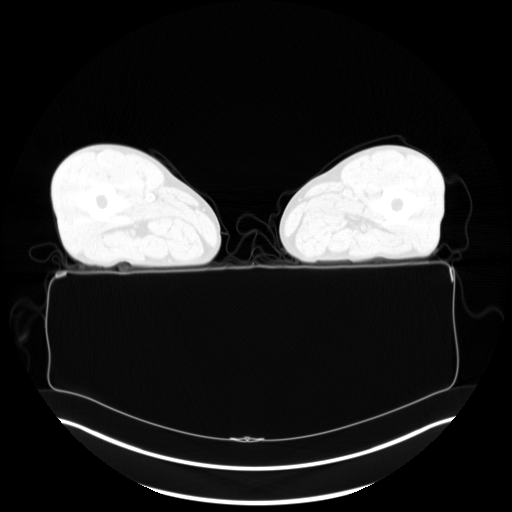
[im 108/323]
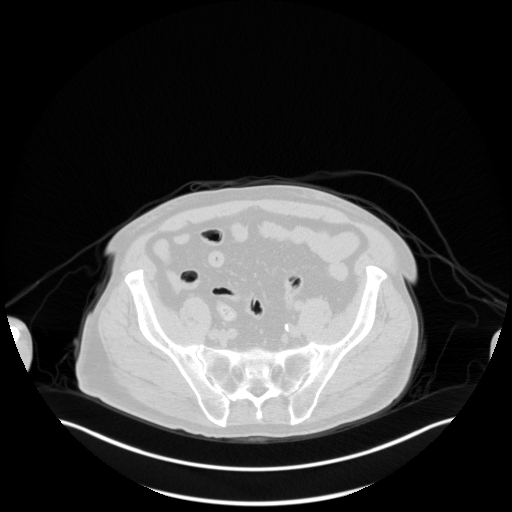
[im 215/323]
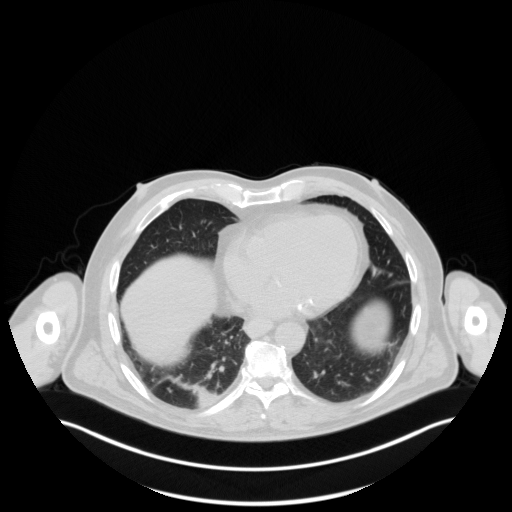
[im 323/323  brain]
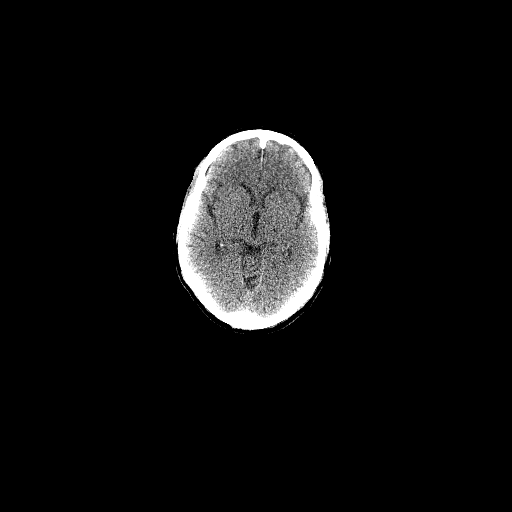

[Series 4: pet ac · axial · 3.0mm · 4.11mm/px · z∈[-984,-18]mm · 4 of 323 slices shown]
[im 1/323]
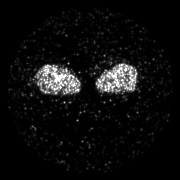
[im 108/323]
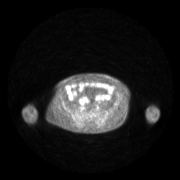
[im 215/323]
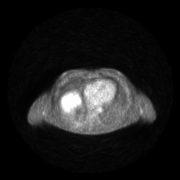
[im 323/323]
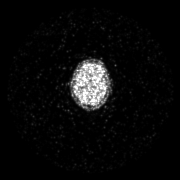

[Series 5: pet nac · axial · 3.0mm · 4.11mm/px · z∈[-984,-18]mm · 4 of 323 slices shown]
[im 1/323]
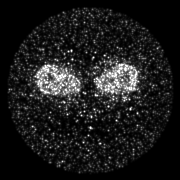
[im 108/323]
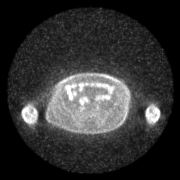
[im 215/323]
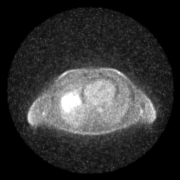
[im 323/323]
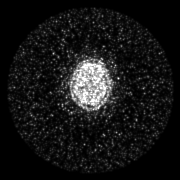

[Series 7: ct lung · axial · 3.0mm · 0.98mm/px · 1 of 90 slices shown]
[im 1/90]
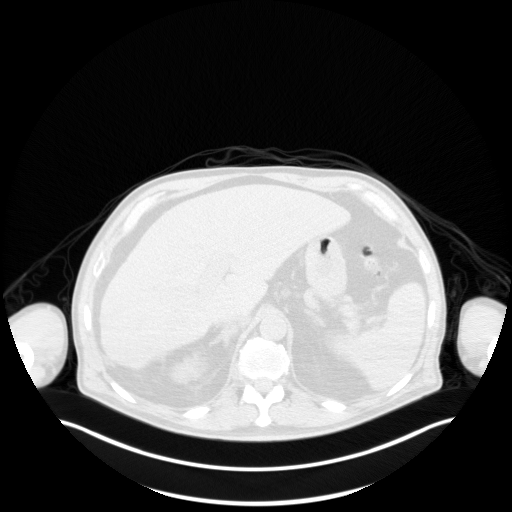

[Series 606: fused tra · 7 of 483 slices shown]
[im 1/483]
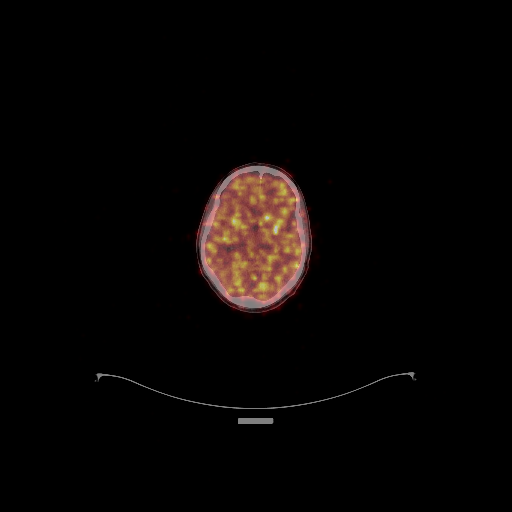
[im 81/483]
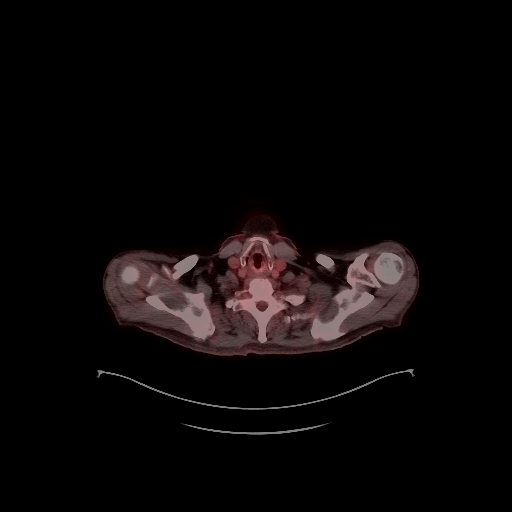
[im 161/483]
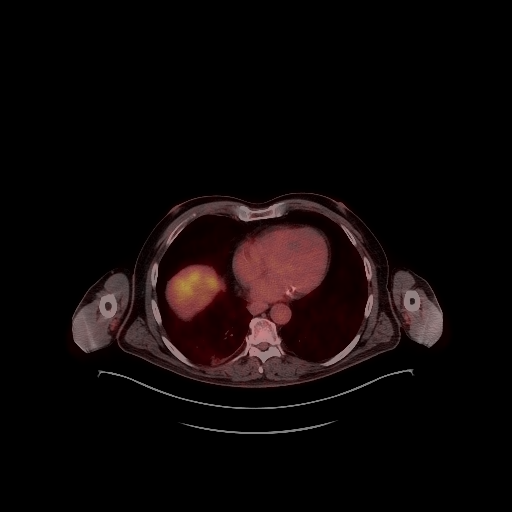
[im 242/483]
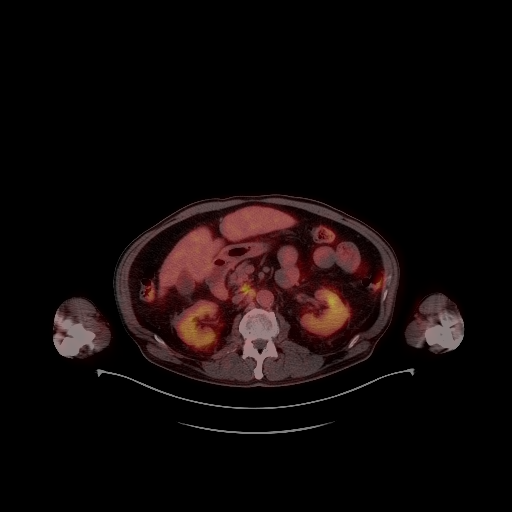
[im 322/483]
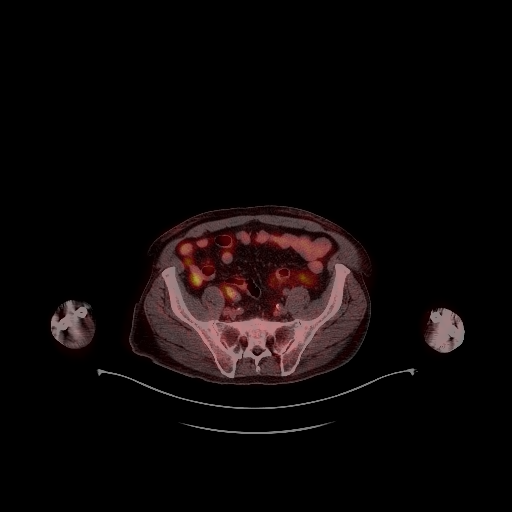
[im 402/483]
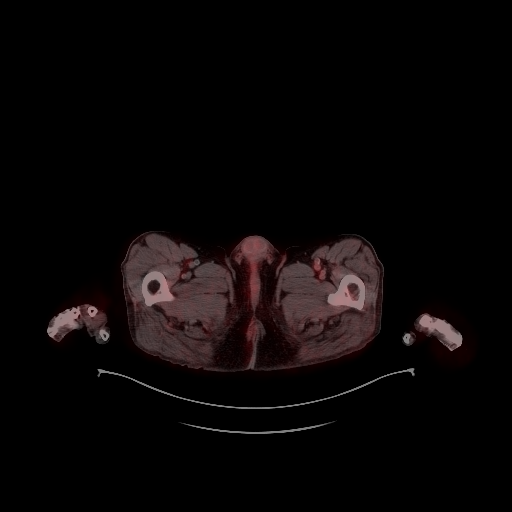
[im 483/483]
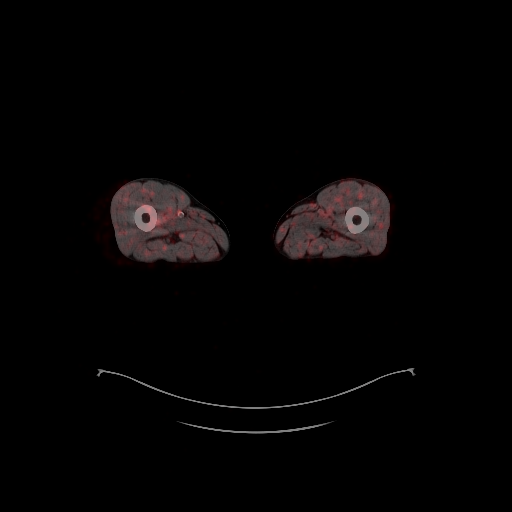

[Series 608: fused cor · 2 of 118 slices shown]
[im 1/118]
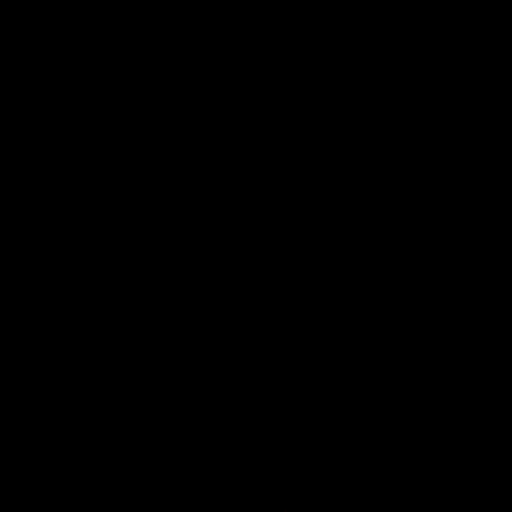
[im 118/118]
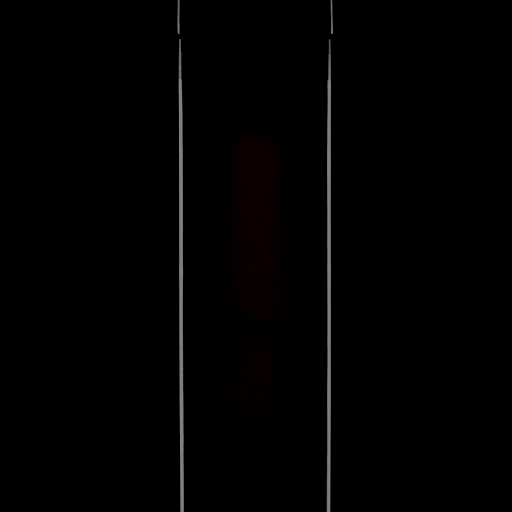

[Series 609: mip cine · coronal · 2.00mm/px · 1 of 48 slices shown]
[im 1/48]
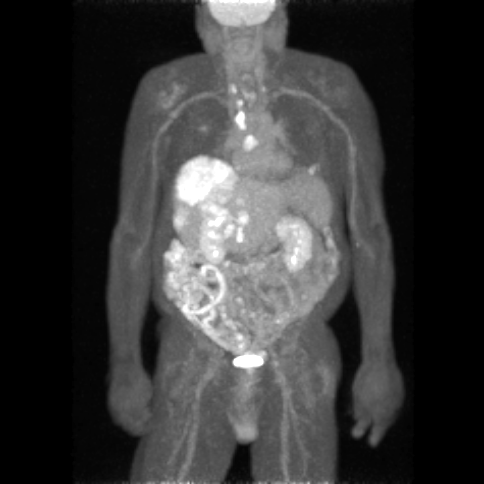

[Series 1036: results mm oncology reading · 4.0mm · 1.04mm/px · 1 of 7 slices shown (1 of 2)]
[im 1/7]
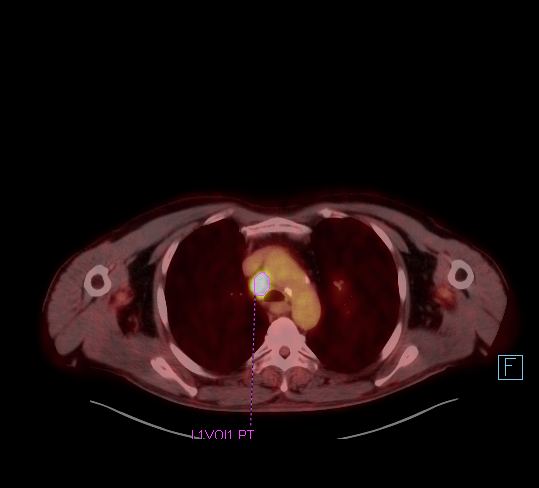

[Series 1060: results mm oncology reading · 4.0mm · 1.04mm/px · 1 of 2 slices shown (2 of 2)]
[im 1/2]
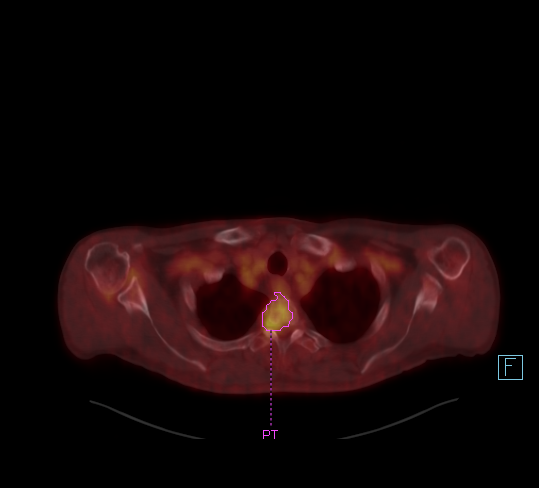

[25 of 25 positions shown; findings below may reference images not displayed]

FINDINGS: Mediastinal blood pool activity: SUV max

Liver activity: SUV max NA

NECK: No hypermetabolic lymph nodes in the neck.

Incidental CT findings: none

CHEST: Hypermetabolic RIGHT lower paratracheal and subcarinal lymph
nodes. For example RIGHT lower paratracheal node measuring 17 mm SUV
max equal 11.2. Hypermetabolic subcarinal node measures 17 mm with
SUV max equal 6.9.

Small RIGHT supraclavicular node measuring 9 mm (image 60/3) with
SUV max equal

Small LEFT supraclavicular node more superficial measuring 6 mm
(image 60/3) with SUV max equal 3.5.

No suspicious pulmonary nodules. Band of benign atelectasis of RIGHT
lung base.

Incidental CT findings: none

ABDOMEN/PELVIS: Large hypermetabolic mass in the RIGHT hepatic lobe
corresponds to the mass lesion on comparison MRI. Mass measures
approximately 11 cm x 11 cm with SUV max equal 9.1. Mass is
uniformly hypermetabolic with some central photopenia.

There is medial extension of the hypermetabolic activit from the
RIGHT hepatic lobe mass into the RIGHT portal vein (image 142).

Hypermetabolic periportal nodes with SUV max equal 7.6.

No hypermetabolic pelvic adenopathy.

Normal adrenal glands.  Normal pancreas.

Incidental CT findings: Atherosclerotic calcification of the aorta.

SKELETON: Mild hypermetabolic activity associated with the T2
vertebral body (image 64) with SUV max equal 3.7. Mild sclerosis at
this level.

Second lesion in the LEFT aspect of the L5 vertebral body with SUV
max equal 4.7 (image 201). No CT findings.

Incidental CT findings: none
IMPRESSION: 1. Hypermetabolic mass within the RIGHT hepatic lobe is most
suggestive of primary hepatic carcinoma (hepatocellular carcinoma).
2. Evidence of hypermetabolic tumor extension into the RIGHT portal
vein.
3. Hypermetabolic nodal metastasis to the porta hepatis.
4. Thoracic hypermetabolic nodal metastasis to the mediastinum.
5. Small hypermetabolic supraclavicular nodes also consistent with
nodal metastasis.
6. Hypermetabolic lesions at T2 and L5 concerning for skeletal
metastasis.

## 2022-04-20 MED ORDER — FLUDEOXYGLUCOSE F - 18 (FDG) INJECTION
9.3000 | Freq: Once | INTRAVENOUS | Status: AC | PRN
  Administered 2022-04-20: 9.3 via INTRAVENOUS

## 2022-04-25 ENCOUNTER — Encounter (HOSPITAL_COMMUNITY): Payer: Self-pay

## 2022-04-25 ENCOUNTER — Other Ambulatory Visit (HOSPITAL_COMMUNITY): Payer: Self-pay | Admitting: *Deleted

## 2022-04-25 ENCOUNTER — Other Ambulatory Visit (HOSPITAL_COMMUNITY): Payer: Self-pay

## 2022-04-25 MED ORDER — MORPHINE SULFATE ER 15 MG PO TBCR
15.0000 mg | EXTENDED_RELEASE_TABLET | Freq: Two times a day (BID) | ORAL | 0 refills | Status: DC
Start: 2022-04-25 — End: 2022-05-09

## 2022-04-25 MED ORDER — OXYCODONE HCL ER 20 MG PO T12A: 20.0000 mg | EXTENDED_RELEASE_TABLET | Freq: Two times a day (BID) | ORAL | 0 refills | Status: DC

## 2022-04-25 NOTE — Progress Notes (Signed)
Call received from patient's wife stating that the patient had an eventful weekend with reports of difficulty swallowing, waking up feeling as if he cannot catch his breath and excessive sweating. She tells me that they called EMS out to their home today and they instructed her to remove the fentanyl patch as prescribed by Dr. Delton Coombes. Dr. Delton Coombes made aware and agrees to remove patch. Order sent for Oxycontin '20mg'$  BID per Dr. Delton Coombes. Attempted to reach wife but was unable to reach her directly. Detailed VM left asking that she call me back with further questions or concerns.

## 2022-04-25 NOTE — Progress Notes (Signed)
PA submitted to [employer] via CoverMyMeds for Oxycontin ER 20 mg tablets.  PA was denied. Patient has previously tried Hydrocodone and Fentanyl with a serious reaction to Fentanyl, but insurance company requires that he try three of the following before he can be approved.  Fentanyl transderm, Hydrocodone ER, Hydromorphone ER, Methadone, Morphine ER, and/or Xtampza ER.  MD notified and a new rx for MS Contin was sent in.  Patient's wife informed.

## 2022-05-01 ENCOUNTER — Other Ambulatory Visit: Payer: Self-pay | Admitting: Radiology

## 2022-05-01 DIAGNOSIS — K769 Liver disease, unspecified: Secondary | ICD-10-CM

## 2022-05-01 NOTE — H&P (Incomplete)
Chief Complaint: Patient was seen in consultation today for liver mass biopsy at the request of [place]  Referring Physician(s): Katragadda,Sreedhar  Supervising Physician: Michaelle Birks  Patient Status: [place]  History of Present Illness: Bryce Adams is a 71 y.o. male with PMH of DM type II, HLD, HTN, CVA and thyroid disease.  Patient saw PCP complaining of fatigue, decrease in appetite and unintentional weight loss over 6 months time with constant right abdominal pain that radiates to back up to his chest.  Patient had MRI 04/06/2022 which found 11 x 2 x 8.5 cm large irregular poorly defined mass at the dome of the right hepatic lobe with numerous satellite tumors, associated tumor thrombus involving right portal vein and its branches, lymphadenopathy in porta caval region as well as 2 enhancing bone lesions at L5 and T11 vertebral body.  Patient was referred to Dr. Delton Coombes, oncology.  Dr. Delton Coombes has referred patient to IR for hepatic mass biopsy.  Procedure was approved by Dr. Anselm Pancoast on condition Plavix can be held.  MR Abdomen 04/06/22: IMPRESSION: 1. Large irregular and poorly defined mass centered at the dome of the right hepatic lobe which is consistent with malignancy, possibly hepatocellular carcinoma, cholangiocarcinoma, or metastasis. 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 No definite mass visualized in the left lobe. 2. Associated tumor thrombus involving the right portal vein and its branches. 3. Lymphadenopathy centered in the portacaval region which is likely metastatic. 4. At least 2 enhancing bone lesions visualized, largest at L5, highly suspicious for metastasis. 5. Splenomegaly. 6. Other ancillary findings as described.    Past Medical History:  Diagnosis Date   Diabetes mellitus without complication ([place])    Hyperlipemia    Hypertension    Renal disorder    Stroke [facility])    Thyroid disease     Past  Surgical History:  Procedure Laterality Date   CARDIAC CATHETERIZATION     CARPAL TUNNEL RELEASE     HAND TENDON SURGERY      Allergies: Fentanyl  Medications: Prior to Admission medications   Medication Sig Start Date End Date Taking? Authorizing Provider  fentaNYL (DURAGESIC) 50 MCG/HR Place 1 patch onto the skin every 3 (three) days. 04/19/22   Derek Jack, MD  morphine (MS CONTIN) 15 MG 12 hr tablet Take 1 tablet (15 mg total) by mouth every 12 (twelve) hours. 04/25/22   Derek Jack, MD  amLODipine (NORVASC) 2.5 MG tablet SMARTSIG:1 Tablet(s) By Mouth Every Evening 02/10/22   [provider]  atorvastatin (LIPITOR) 20 MG tablet Take 20 mg by mouth every morning.    [provider]  clopidogrel (PLAVIX) 75 MG tablet Take 1 tablet (75 mg total) by mouth daily. 09/14/21 09/14/22  Manuella Ghazi, Pratik D, DO  Continuous Blood Gluc Sensor (DEXCOM G6 SENSOR) MISC Apply topically daily. 11/05/21   [provider]  famotidine (PEPCID) 20 MG tablet Take 20 mg by mouth daily.    [provider]  hydrochlorothiazide (HYDRODIURIL) 25 MG tablet Take 25 mg by mouth every morning.    [provider]  HYDROcodone-acetaminophen (NORCO) 10-325 MG tablet Take 1 tablet by mouth 4 (four) times daily as needed. 04/19/22   Derek Jack, MD  Insulin Pen Needle (B-D UF III MINI PEN NEEDLES) 31G X 5 MM MISC USE EVERY DAY 12/23/21   [provider]  irbesartan (AVAPRO) 300 MG tablet Take 1 tablet by mouth daily. 02/11/21   [provider]  levothyroxine (SYNTHROID, [place]) 50 MCG  tablet Take 50 mcg by mouth every morning.    [provider]  Magnesium Oxide 400 MG CAPS Take 1 capsule by mouth 2 (two) times daily. 09/09/21   [provider]  metFORMIN (GLUCOPHAGE) 1000 MG tablet Take 1,000 mg by mouth 2 (two) times daily with a meal.    [provider]  Multiple Vitamin (MULTIVITAMIN WITH MINERALS) TABS Take 1  tablet by mouth daily.    [provider]  omega-3 acid ethyl esters (LOVAZA) 1 G capsule Take 2 g by mouth 2 (two) times daily.    [provider]  [facility] VERIO test strip daily. 01/24/22   [provider]  sitaGLIPtin (JANUVIA) 100 MG tablet Take 100 mg by mouth daily.    [provider]  valsartan (DIOVAN) 160 MG tablet Take 160 mg by mouth daily.    [provider]  VICTOZA 18 MG/3ML SOPN Inject 1.8 mg into the skin every evening. 02/27/17   [provider]     Family History  Problem Relation Age of Onset   Colon polyps Mother    Colon cancer Mother    Esophageal cancer Neg Hx    Rectal cancer Neg Hx    Stomach cancer Neg Hx     Social History   Socioeconomic History   Marital status: Married    Spouse name: Not on file   Number of children: Not on file   Years of education: Not on file   Highest education level: Not on file  Occupational History   Not on file  Tobacco Use   Smoking status: Every Day    Packs/day: 2.00    Types: Cigarettes   Smokeless tobacco: Never  Substance and Sexual Activity   Alcohol use: Yes    Alcohol/week: 1.0 standard drink    Types: 1 Cans of beer per week    Comment: occasionally    Drug use: No   Sexual activity: Not on file  Other Topics Concern   Not on file  Social History Narrative   Not on file   Social Determinants of Health   Financial Resource Strain: Not on file  Food Insecurity: Not on file  Transportation Needs: Not on file  Physical Activity: Not on file  Stress: Not on file  Social Connections: Not on file    Review of Systems: A 12 point ROS discussed and pertinent positives are indicated in the HPI above.  All other systems are negative.  Review of Systems  Constitutional:  Positive for appetite change, fatigue and unexpected weight change. Negative for chills and fever.  Respiratory:  Positive for shortness of breath.   Cardiovascular:  Negative for chest  pain and leg swelling.  Gastrointestinal:  Positive for abdominal pain and nausea. Negative for vomiting.  Musculoskeletal:  Positive for arthralgias.  Neurological:  Positive for weakness. Negative for headaches.   Vital Signs: BP (!) 149/78   Pulse 85   Temp 97.7 F (36.5 C) (Oral)   Resp 18   Ht '5\' 9"'$  (1.753 m)   Wt 173 lb (78.5 kg)   SpO2 98%   BMI 25.55 kg/m   Physical Exam Constitutional:      General: He is not in acute distress.    Appearance: Normal appearance. He is not ill-appearing.  HENT:     Head: Normocephalic and atraumatic.     Mouth/Throat:     Mouth: Mucous membranes are dry.     Pharynx: Oropharynx is clear.  Eyes:  Extraocular Movements: Extraocular movements intact.     Pupils: Pupils are equal, round, and reactive to light.  Cardiovascular:     Rate and Rhythm: Normal rate and regular rhythm.     Pulses: Normal pulses.     Heart sounds: Normal heart sounds.  Pulmonary:     Effort: Pulmonary effort is normal. No respiratory distress.     Breath sounds: Normal breath sounds.  Abdominal:     General: Bowel sounds are normal.     Palpations: Abdomen is soft.  Musculoskeletal:     Right lower leg: No edema.     Left lower leg: No edema.  Skin:    General: Skin is warm and dry.  Neurological:     Mental Status: He is alert and oriented to person, place, and time.  Psychiatric:        Mood and Affect: Mood normal.        Behavior: Behavior normal.        Thought Content: Thought content normal.        Judgment: Judgment normal.    Imaging: MR ABDOMEN WWO CONTRAST  Result Date: 04/07/2022 CLINICAL DATA:  Liver mass EXAM: MRI ABDOMEN WITHOUT AND WITH CONTRAST TECHNIQUE: Multiplanar multisequence MR imaging of the abdomen was performed both before and after the administration of intravenous contrast. CONTRAST:  28m GADAVIST GADOBUTROL 1 MMOL/ML IV SOLN COMPARISON:  Abdominal ultrasound 03/20/2022 FINDINGS: Lower chest: No acute findings.  Hepatobiliary: Large irregular and poorly defined mass visualized in the right hepatic lobe centered at the dome measuring up to approximately 11 x 12 x 8.5 cm in AP, transverse and craniocaudal dimensions. Diffuse mildly increased T2 and increased DWI signal with irregular surrounding/rim enhancement visualized with mostly hypoenhancing appearance centrally. There are numerous surrounding satellite masses throughout the right lobe measuring up to 3.2 cm in segment 6. There is also mildly enhancing tumor thrombus visualized throughout the right portal vein and its branches. There is mild associated nodularity/capsular retraction throughout much of the right hepatic lobe. No definite masses visualized in the left lobe of the liver. Small stone in the gallbladder.  No biliary ductal dilatation. Pancreas: No mass, inflammatory changes, or other parenchymal abnormality identified. Spleen:  Enlarged measuring 14 cm in length. Adrenals/Urinary Tract: Adrenal glands are within normal limits. 1.2 cm hemorrhagic cyst in the upper pole left kidney. A few small simple renal cysts bilaterally. No hydronephrosis. Stomach/Bowel: Colonic diverticulosis.  No bowel obstruction. Vascular/Lymphatic: Mildly enhancing, hyperintense DWI signal tumor thrombus in the right portal vein. Severe atherosclerotic disease. Bulky enlarged portacaval lymph nodes largest is bilobed measuring up to 3.9 x 3 cm, series 11, image 19. Other:  Trace perihepatic ascites. Musculoskeletal: 1.7 cm enhancing lesion in the L5 vertebral body and 0.9 cm enhancing focus in the T11 vertebral body. IMPRESSION: 1. Large irregular and poorly defined mass centered at the dome of the right hepatic lobe which is consistent with malignancy, possibly hepatocellular carcinoma, cholangiocarcinoma, or metastasis. 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 No definite mass visualized in the left lobe. 2. Associated tumor thrombus involving the right  portal vein and its branches. 3. Lymphadenopathy centered in the portacaval region which is likely metastatic. 4. At least 2 enhancing bone lesions visualized, largest at L5, highly suspicious for metastasis. 5. Splenomegaly. 6. Other ancillary findings as described. Electronically Signed   By: DOfilia NeasM.D.   On: 04/07/2022 16:05   NM PET Image Initial (PI) Skull Base To Thigh (F-18 FDG)  Result Date: 04/21/2022 CLINICAL DATA:  Initial treatment strategy for liver mass. EXAM: NUCLEAR MEDICINE PET SKULL BASE TO THIGH TECHNIQUE: 9.3 mCi F-18 FDG was injected intravenously. Full-ring PET imaging was performed from the skull base to thigh after the radiotracer. CT data was obtained and used for attenuation correction and anatomic localization. Fasting blood glucose: 170 mg/dl COMPARISON:  Abdominal MRI 04/06/2022 FINDINGS: Mediastinal blood pool activity: SUV max Liver activity: SUV max NA NECK: No hypermetabolic lymph nodes in the neck. Incidental CT findings: none CHEST: Hypermetabolic RIGHT lower paratracheal and subcarinal lymph nodes. For example RIGHT lower paratracheal node measuring 17 mm SUV max equal 11.2. Hypermetabolic subcarinal node measures 17 mm with SUV max equal 6.9. Small RIGHT supraclavicular node measuring 9 mm (image 60/3) with SUV max equal 5.2 Small LEFT supraclavicular node more superficial measuring 6 mm (image 60/3) with SUV max equal 3.5. No suspicious pulmonary nodules. Band of benign atelectasis of RIGHT lung base. Incidental CT findings: none ABDOMEN/PELVIS: Large hypermetabolic mass in the RIGHT hepatic lobe corresponds to the mass lesion on comparison MRI. Mass measures approximately 11 cm x 11 cm with SUV max equal 9.1. Mass is uniformly hypermetabolic with some central photopenia. There is medial extension of the hypermetabolic activit from the RIGHT hepatic lobe mass into the RIGHT portal vein (image 142). Hypermetabolic periportal nodes with SUV max equal 7.6. No  hypermetabolic pelvic adenopathy. Normal adrenal glands.  Normal pancreas. Incidental CT findings: Atherosclerotic calcification of the aorta. SKELETON: Mild hypermetabolic activity associated with the T2 vertebral body (image 64) with SUV max equal 3.7. Mild sclerosis at this level. Second lesion in the LEFT aspect of the L5 vertebral body with SUV max equal 4.7 (image 201). No CT findings. Incidental CT findings: none IMPRESSION: 1. Hypermetabolic mass within the RIGHT hepatic lobe is most suggestive of primary hepatic carcinoma (hepatocellular carcinoma). 2. Evidence of hypermetabolic tumor extension into the RIGHT portal vein. 3. 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 4. Thoracic hypermetabolic nodal metastasis to the mediastinum. 5. Small hypermetabolic supraclavicular nodes also consistent with nodal metastasis. 6. Hypermetabolic lesions at T2 and L5 concerning for skeletal metastasis. Electronically Signed   By: Suzy Bouchard M.D.   On: 04/21/2022 17:14    Labs:  CBC: Recent Labs    09/13/21 0538 09/14/21 0526 04/13/22 0927 05/02/22 0742  WBC 10.4 10.0 15.5* 12.7*  HGB 13.3 13.7 13.3 11.7*  HCT 38.0* 39.5 39.7 34.7*  PLT 242 236 395 249    COAGS: Recent Labs    09/12/21 1751 05/02/22 0742  INR 0.9 1.0  APTT 27  --     BMP: Recent Labs    09/12/21 1751 09/13/21 0538 09/14/21 0526 04/13/22 0927  NA 133* 132* 133* 130*  K 4.8 4.2 4.3 5.5*  CL 100 101 103 97*  CO2 20* '24 23 24  '$ GLUCOSE 186* 231* 220* 163*  BUN 33* 33* 31* 27*  CALCIUM 9.2 9.4 9.1 9.4  CREATININE 1.46* 1.05 1.07 1.22  GFRNONAA 52* >60 >60 >60    LIVER FUNCTION TESTS: Recent Labs    09/12/21 1751 09/13/21 0538 04/13/22 0927  BILITOT 0.2* 0.3 0.6  AST 34 24 47*  ALT 44 38 36  ALKPHOS 88 85 295*  PROT 7.1 6.6 8.1  ALBUMIN 3.9 3.7 3.7    TUMOR MARKERS: No results for input(s): AFPTM, CEA, CA199, CHROMGRNA in the last 8760 hours.  Assessment and Plan: History of DM type II,  HLD, HTN, CVA and thyroid disease.  Patient  saw PCP complaining of fatigue, decrease in appetite and unintentional weight loss over 6 months time with constant right abdominal pain that radiates to back up to his chest.  Patient had MRI 04/06/2022 which found 11 x 2 x 8.5 cm large irregular poorly defined mass at the dome of the right hepatic lobe with numerous satellite tumors, associated tumor thrombus involving right portal vein and its branches, lymphadenopathy in porta caval region as well as 2 enhancing bone lesions at L5 and T11 vertebral body.  Patient was referred to Dr. Delton Coombes, oncology.  Dr. Delton Coombes has referred patient to IR for hepatic mass biopsy.  Procedure was approved by Dr. Anselm Pancoast on condition Plavix can be held.  MR Abdomen 04/06/22: IMPRESSION: 1. Large irregular and poorly defined mass centered at the dome of the right hepatic lobe which is consistent with malignancy, possibly hepatocellular carcinoma, cholangiocarcinoma, or metastasis. 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 No definite mass visualized in the left lobe. 2. Associated tumor thrombus involving the right portal vein and its branches. 3. Lymphadenopathy centered in the portacaval region which is likely metastatic. 4. At least 2 enhancing bone lesions visualized, largest at L5, highly suspicious for metastasis. 5. Splenomegaly. 6. Other ancillary findings as described.  Pt resting on stretcher. He is A&O, calm and pleasant.  He is in no distress.  Pt is NPO per order. He has held Plavix for 5 days.  Pt was told not to take his pain medications.  Pt was provided with one prescribed hydrocodone 10/325.   Risks and benefits of liver mass biopsy with moderate sedation was discussed with the patient and/or patient's family including, but not limited to bleeding, infection, damage to adjacent structures or low yield requiring additional tests.  All of the questions were answered and  there is agreement to proceed.  Consent signed and in chart.   Thank you for this interesting consult.  I greatly enjoyed meeting [place] and look forward to participating in their care.  A copy of this report was sent to the requesting provider on this date.  Electronically Signed: Tyson Alias, NP 05/02/2022, 8:11 AM   I spent a total of 20 minutes in face to face in clinical consultation, greater than 50% of which was counseling/coordinating care for liver mass biopsy.

## 2022-05-02 ENCOUNTER — Encounter (HOSPITAL_COMMUNITY): Payer: Self-pay

## 2022-05-02 ENCOUNTER — Ambulatory Visit (HOSPITAL_COMMUNITY)
Admission: RE | Admit: 2022-05-02 | Discharge: 2022-05-02 | Disposition: A | Discharge status: Home / Self Care | Payer: No Typology Code available for payment source | Source: Ambulatory Visit | Attending: Hematology | Admitting: Hematology

## 2022-05-02 ENCOUNTER — Other Ambulatory Visit (HOSPITAL_COMMUNITY): Payer: Self-pay | Admitting: Hematology

## 2022-05-02 ENCOUNTER — Other Ambulatory Visit: Payer: Self-pay

## 2022-05-02 DIAGNOSIS — R160 Hepatomegaly, not elsewhere classified: Secondary | ICD-10-CM

## 2022-05-02 DIAGNOSIS — R109 Unspecified abdominal pain: Secondary | ICD-10-CM | POA: Diagnosis not present

## 2022-05-02 DIAGNOSIS — E119 Type 2 diabetes mellitus without complications: Secondary | ICD-10-CM | POA: Insufficient documentation

## 2022-05-02 DIAGNOSIS — R161 Splenomegaly, not elsewhere classified: Secondary | ICD-10-CM | POA: Diagnosis not present

## 2022-05-02 DIAGNOSIS — I10 Essential (primary) hypertension: Secondary | ICD-10-CM | POA: Insufficient documentation

## 2022-05-02 DIAGNOSIS — I8290 Acute embolism and thrombosis of unspecified vein: Secondary | ICD-10-CM | POA: Insufficient documentation

## 2022-05-02 DIAGNOSIS — E079 Disorder of thyroid, unspecified: Secondary | ICD-10-CM | POA: Diagnosis not present

## 2022-05-02 DIAGNOSIS — K769 Liver disease, unspecified: Secondary | ICD-10-CM | POA: Diagnosis not present

## 2022-05-02 DIAGNOSIS — R591 Generalized enlarged lymph nodes: Secondary | ICD-10-CM | POA: Insufficient documentation

## 2022-05-02 DIAGNOSIS — R634 Abnormal weight loss: Secondary | ICD-10-CM | POA: Insufficient documentation

## 2022-05-02 DIAGNOSIS — Z8673 Personal history of transient ischemic attack (TIA), and cerebral infarction without residual deficits: Secondary | ICD-10-CM | POA: Insufficient documentation

## 2022-05-02 DIAGNOSIS — E785 Hyperlipidemia, unspecified: Secondary | ICD-10-CM | POA: Diagnosis not present

## 2022-05-02 DIAGNOSIS — M899 Disorder of bone, unspecified: Secondary | ICD-10-CM | POA: Diagnosis not present

## 2022-05-02 DIAGNOSIS — Z79899 Other long term (current) drug therapy: Secondary | ICD-10-CM | POA: Insufficient documentation

## 2022-05-02 LAB — COMPREHENSIVE METABOLIC PANEL
ALT: 30 U/L (ref 0–44)
AST: 47 U/L — ABNORMAL HIGH (ref 15–41)
Albumin: 3.3 g/dL — ABNORMAL LOW (ref 3.5–5.0)
Alkaline Phosphatase: 255 U/L — ABNORMAL HIGH (ref 38–126)
Anion gap: 10 (ref 5–15)
BUN: 31 mg/dL — ABNORMAL HIGH (ref 8–23)
CO2: 23 mmol/L (ref 22–32)
Calcium: 9.3 mg/dL (ref 8.9–10.3)
Chloride: 97 mmol/L — ABNORMAL LOW (ref 98–111)
Creatinine, Ser: 1.04 mg/dL (ref 0.61–1.24)
GFR, Estimated: >60 mL/min (ref >60)
Glucose, Bld: 149 mg/dL — ABNORMAL HIGH (ref 70–99)
Potassium: 5.2 mmol/L — ABNORMAL HIGH (ref 3.5–5.1)
Sodium: 130 mmol/L — ABNORMAL LOW (ref 135–145)
Total Bilirubin: 0.7 mg/dL (ref 0.3–1.2)
Total Protein: 7.3 g/dL (ref 6.5–8.1)

## 2022-05-02 LAB — CBC WITH DIFFERENTIAL/PLATELET
Abs Immature Granulocytes: 0.28 10*3/uL — ABNORMAL HIGH (ref 0.00–0.07)
Basophils Absolute: 0.1 10*3/uL (ref 0.0–0.1)
Basophils Relative: 1 %
Eosinophils Absolute: 0.1 10*3/uL (ref 0.0–0.5)
Eosinophils Relative: 1 %
HCT: 34.7 % — ABNORMAL LOW (ref 39.0–52.0)
Hemoglobin: 11.7 g/dL — ABNORMAL LOW (ref 13.0–17.0)
Immature Granulocytes: 2 %
Lymphocytes Relative: 6 %
Lymphs Abs: 0.7 10*3/uL (ref 0.7–4.0)
MCH: 29.6 pg (ref 26.0–34.0)
MCHC: 33.7 g/dL (ref 30.0–36.0)
MCV: 87.8 fL (ref 80.0–100.0)
Monocytes Absolute: 1.3 10*3/uL — ABNORMAL HIGH (ref 0.1–1.0)
Monocytes Relative: 10 %
Neutro Abs: 10.3 10*3/uL — ABNORMAL HIGH (ref 1.7–7.7)
Neutrophils Relative %: 80 %
Platelets: 249 10*3/uL (ref 150–400)
RBC: 3.95 MIL/uL — ABNORMAL LOW (ref 4.22–5.81)
RDW: 13.1 % (ref 11.5–15.5)
WBC: 12.7 10*3/uL — ABNORMAL HIGH (ref 4.0–10.5)
nRBC: 0 % (ref 0.0–0.2)

## 2022-05-02 LAB — GLUCOSE, CAPILLARY: Glucose-Capillary: 162 mg/dL — ABNORMAL HIGH (ref 70–99)

## 2022-05-02 LAB — PROTIME-INR
INR: 1 (ref 0.8–1.2)
Prothrombin Time: 13.5 seconds (ref 11.4–15.2)

## 2022-05-02 IMAGING — US US BIOPSY CORE LIVER
1 series · 15 of 25 positions shown · non-contrast
Comparison: PET-CT, [DATE].

INDICATION: New large liver mass.  No diagnosis.

EXAM:
ULTRASOUND GUIDED LIVER LESION BIOPSY

[Series 1: us biopsy mc & wl · 60 acquisitions, 15 frames shown]
[im 1/60]
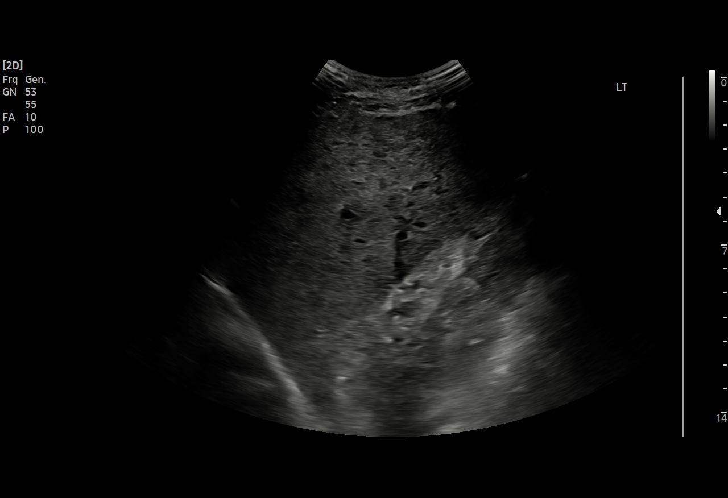
[im 5/60]
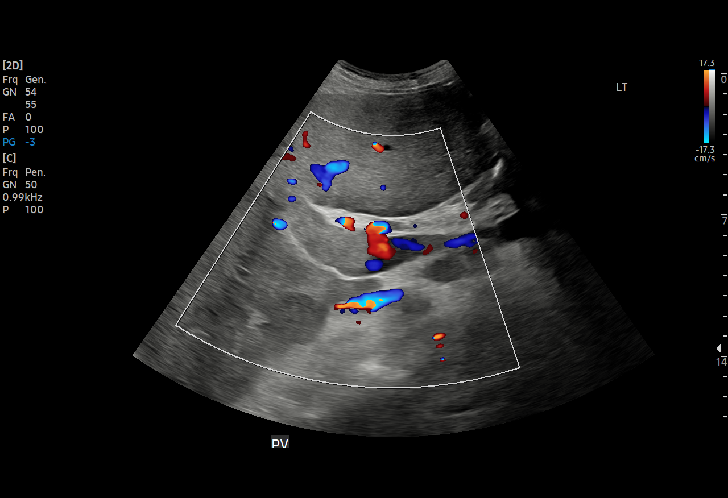
[im 10/60]
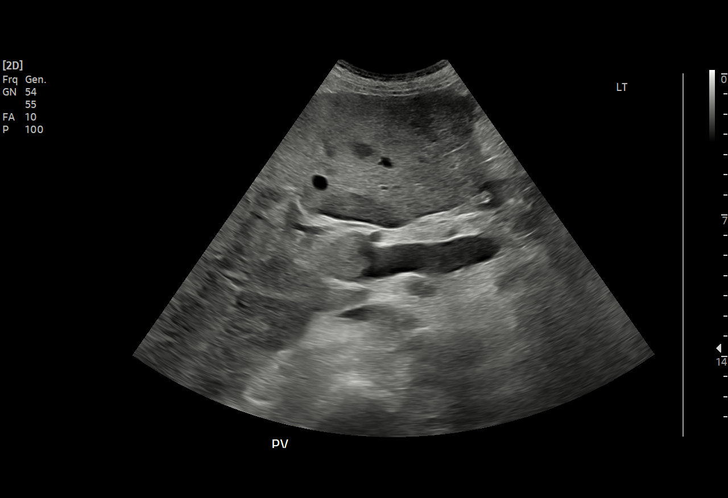
[im 13/60]
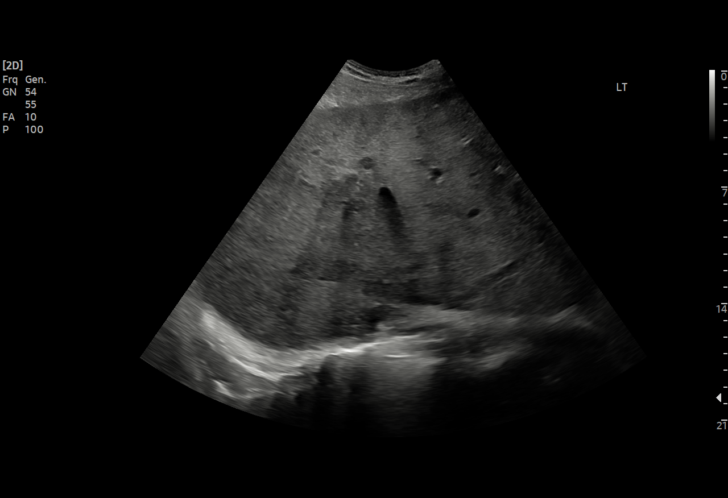
[im 18/60]
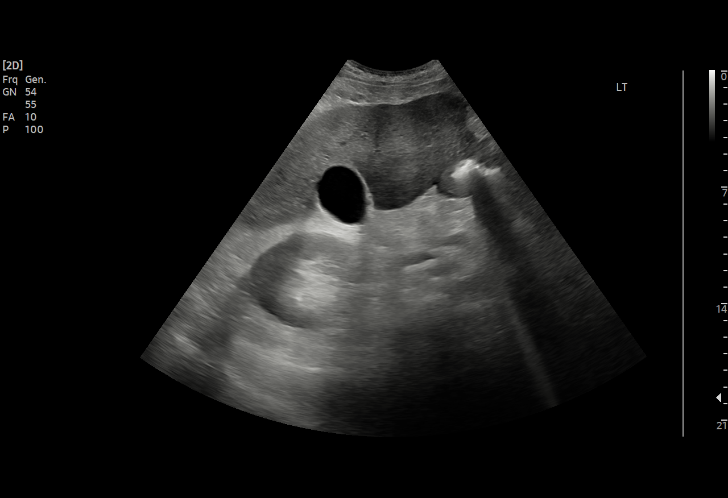
[im 23/60]
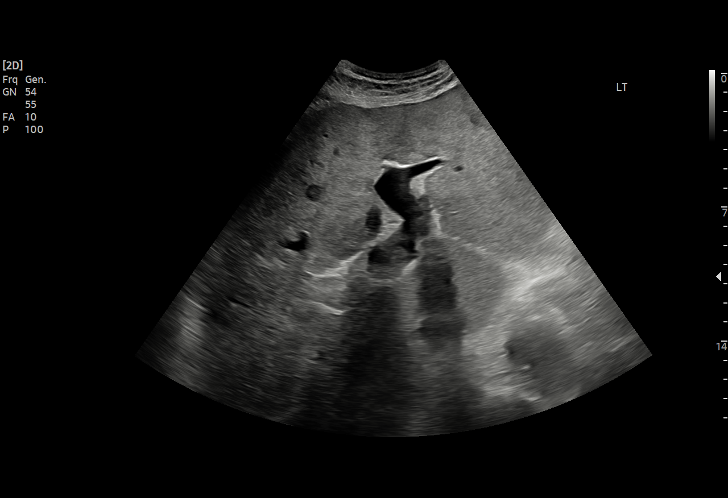
[im 25/60]
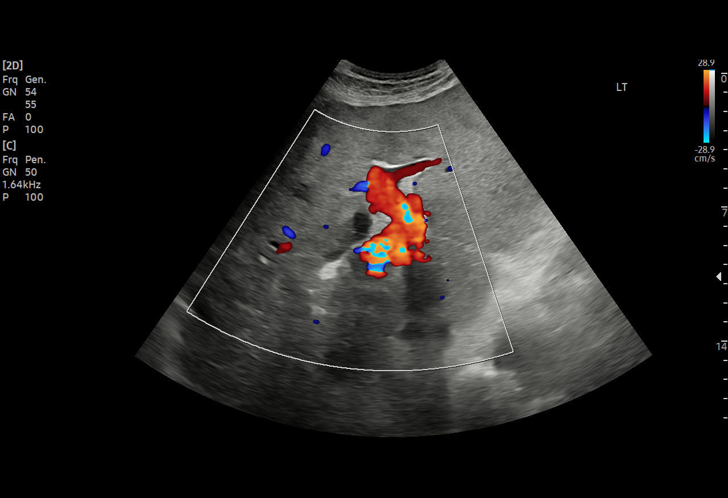
[im 30/60]
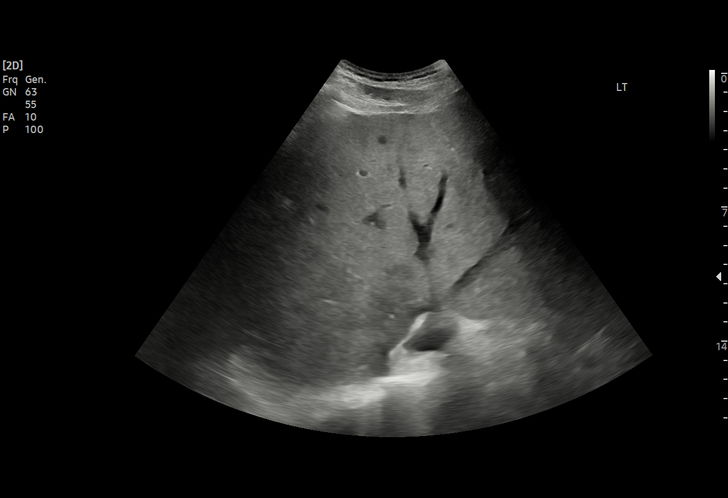
[im 35/60]
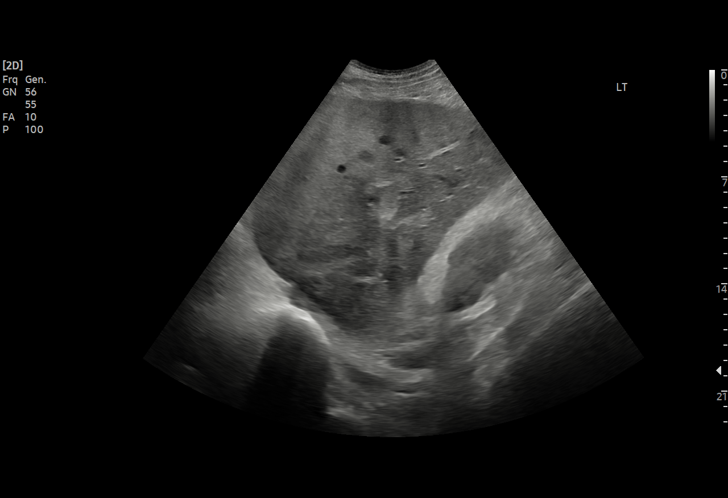
[im 37/60]
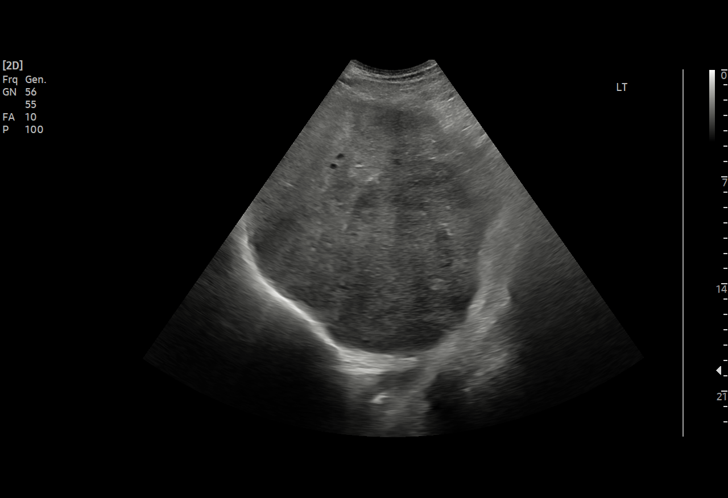
[im 42/60]
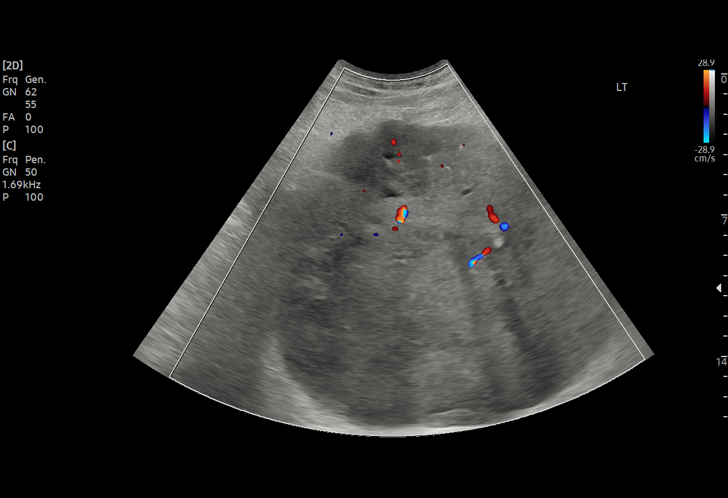
[im 47/60]
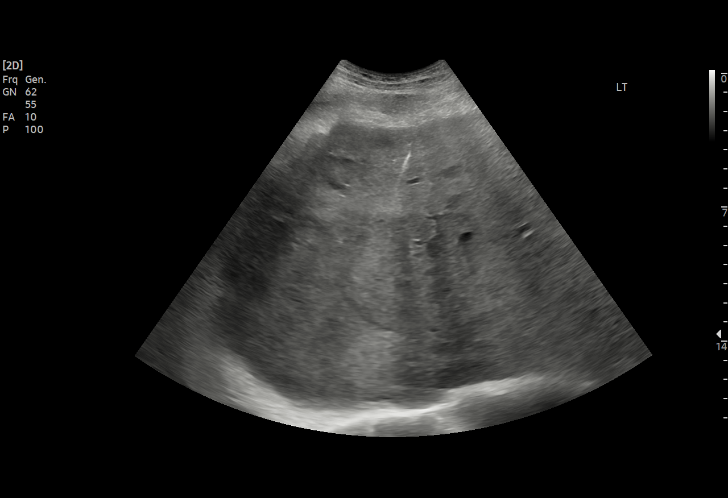
[im 50/60]
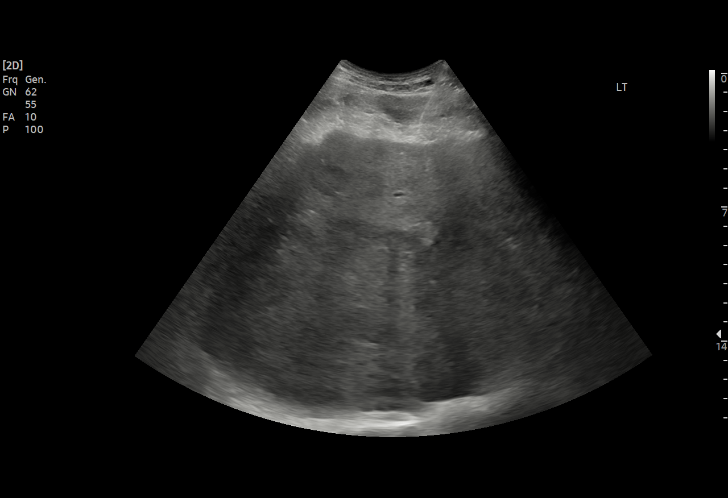
[im 55/60]
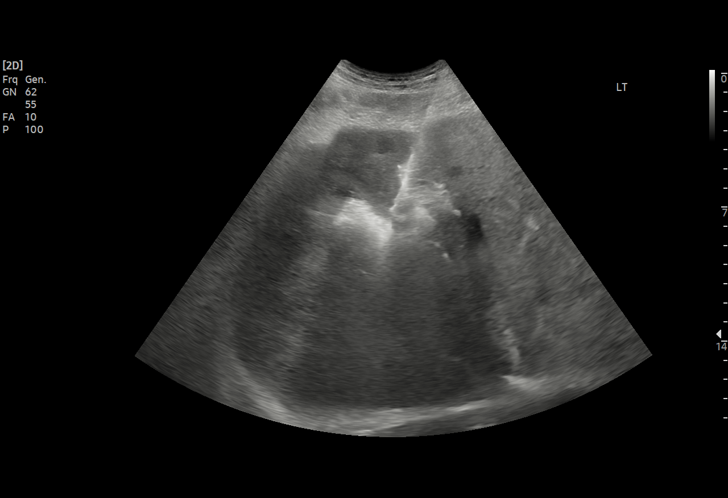
[im 60/60]
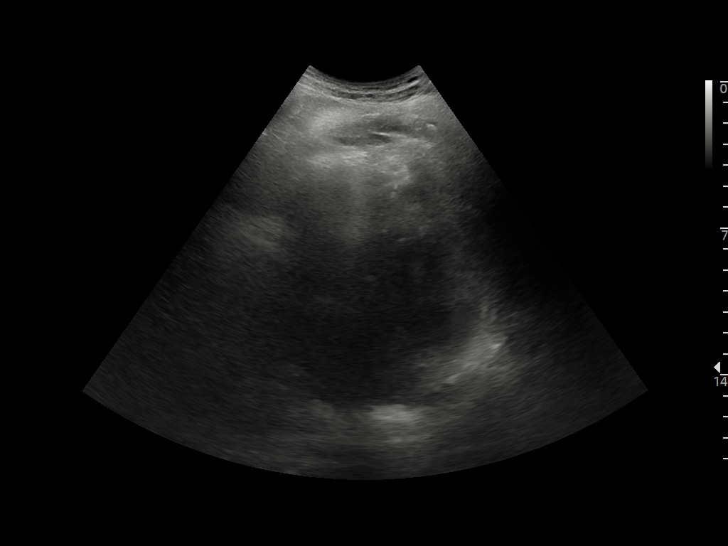

[15 of 25 positions shown; findings below may reference images not displayed]

MR abdomen, [DATE]. US Abdomen,
[DATE].

MEDICATIONS:
None

ANESTHESIA/SEDATION:
Dilaudid 1 mcg IV; Versed 1 mg IV

Total Moderate Sedation time:  22 minutes.

The patient's level of consciousness and vital signs were monitored
continuously by radiology nursing throughout the procedure under my
direct supervision.

COMPLICATIONS:
None immediate.

PROCEDURE:
Informed written consent was obtained from the patient after a
discussion of the risks, benefits and alternatives to treatment. The
patient understands and consents the procedure. A timeout was
performed prior to the initiation of the procedure.

Ultrasound scanning was performed of the right upper abdominal
quadrant demonstrates heterogeneously echogenic, large RIGHT liver
mass.

The RIGHT hepatic lobe mass was selected for biopsy and the
procedure was planned. The RIGHT upper abdominal quadrant was
prepped and draped in the usual sterile fashion. The overlying soft
tissues were anesthetized with 1% lidocaine with epinephrine. A 17
gauge, 11 cm co-axial needle was advanced into a peripheral aspect
of the lesion. This was followed by 4 core biopsies with an 18 gauge
core device under direct ultrasound guidance.

The coaxial needle tract was embolized with a small amount of
Gel-Foam slurry and superficial hemostasis was obtained with manual
compression. Post procedural scanning was negative for definitive
area of hemorrhage or additional complication. A dressing was
placed. The patient tolerated the procedure well without immediate
post procedural complication.
IMPRESSION: Successful ultrasound guided core needle biopsy of the RIGHT hepatic
lobe mass, as above.

## 2022-05-02 MED ORDER — SODIUM CHLORIDE 0.9 % IV SOLN: INTRAVENOUS | Status: DC

## 2022-05-02 MED ORDER — HYDROMORPHONE HCL 1 MG/ML IJ SOLN
INTRAMUSCULAR | Status: AC
  Filled 2022-05-02: qty 1

## 2022-05-02 MED ORDER — GELATIN ABSORBABLE 12-7 MM EX MISC
CUTANEOUS | Status: AC
  Administered 2022-05-02: 1
  Filled 2022-05-02: qty 1

## 2022-05-02 MED ORDER — HYDROCODONE-ACETAMINOPHEN 10-325 MG PO TABS
1.0000 | ORAL_TABLET | ORAL | Status: AC
  Administered 2022-05-02: 1 via ORAL
  Filled 2022-05-02: qty 1

## 2022-05-02 MED ORDER — MIDAZOLAM HCL 2 MG/2ML IJ SOLN
INTRAMUSCULAR | Status: AC
  Filled 2022-05-02: qty 4

## 2022-05-02 MED ORDER — LIDOCAINE HCL 1 % IJ SOLN
INTRAMUSCULAR | Status: AC
  Administered 2022-05-02: 10 mL
  Filled 2022-05-02: qty 20

## 2022-05-02 MED ORDER — MIDAZOLAM HCL 2 MG/2ML IJ SOLN
INTRAMUSCULAR | Status: AC | PRN
  Administered 2022-05-02: 1 mg via INTRAVENOUS

## 2022-05-02 MED ORDER — HYDROMORPHONE HCL 1 MG/ML IJ SOLN
INTRAMUSCULAR | Status: AC | PRN
  Administered 2022-05-02 (×2): .5 mg via INTRAVENOUS

## 2022-05-02 NOTE — Discharge Instructions (Signed)
Please call Interventional Radiology clinic [phone] with any questions or concerns.   You may remove your dressing and shower tomorrow.  Resume medications tomorrow.   Liver Biopsy, Care After After a liver biopsy, it is common to have these things in the area where the biopsy was done. You may: Have pain. Feel sore. Have bruising. You may also feel tired for a few days. Follow these instructions at home: Medicines Take over-the-counter and prescription medicines only as told by your doctor. If you were prescribed an antibiotic medicine, take it as told by your doctor. Do not stop taking the antibiotic, even if you start to feel better. Do not take medicines that may thin your blood. These medicines include aspirin and ibuprofen. Take them only if your doctor tells you to. If told, take steps to prevent problems with pooping (constipation). You may need to: Drink enough fluid to keep your pee (urine) pale yellow. Take medicines. You will be told what medicines to take. Eat foods that are high in fiber. These include beans, whole grains, and fresh fruits and vegetables. Limit foods that are high in fat and sugar. These include fried or sweet foods. Ask your doctor if you should avoid driving or using machines while you are taking your medicine. Caring for your incision Follow instructions from your doctor about how to take care of your cut from surgery (incisions). Make sure you: Wash your hands with soap and water for at least 20 seconds before and after you change your bandage. If you cannot use soap and water, use hand sanitizer. Change your bandage. Leavestitches or skin glue in place for at least two weeks. Leave tape strips alone unless you are told to take them off. You may trim the edges of the tape strips if they curl up. Check your incision every day for signs of infection. Check for: Redness, swelling, or more pain. Fluid or blood. Warmth. Pus or a bad smell. Do  not take baths, swim, or use a hot tub. Ask your doctor about taking showers or sponge baths. Activity Rest at home for 1-2 days, or as told by your doctor. Get up to take short walks every 1 to 2 hours. Ask for help if you feel weak or unsteady. Do not lift anything that is heavier than 10 lb (4.5 kg), or the limit that you are told. Do not play contact sports for 2 weeks after the procedure. Return to your normal activities as told by your doctor. Ask what activities are safe for you. General instructions  Do not drink alcohol in the first week after the procedure. Plan to have a responsible adult care for you for the time you are told after you leave the hospital or clinic. This is important. It is up to you to get the results of your procedure. Ask how to get your results when they are ready. Keep all follow-up visits.   Contact a doctor if: You have more bleeding in your incision. Your incision swells, or is red and more painful. You have fluid that comes from your incision. You develop a rash. You have fever or chills. Get help right away if: You have swelling, bloating, or pain in your belly (abdomen). You get dizzy or faint. You vomit or you feel like vomiting. You have trouble breathing or feel short of breath. You have chest pain. You have problems talking or seeing. You have trouble with your balance or moving your arms or legs. These symptoms may be  an emergency. Get help right away. Call your local emergency services (911 in the [place] Do not wait to see if the symptoms will go away. Do not drive yourself to the hospital. Summary After the procedure, it is common to have pain, soreness, bruising, and tiredness. Your doctor will tell you how to take care of yourself at home. Change your bandage, take your medicines, and limit your activities as told by your doctor. Call your doctor if you have symptoms of infection. Get help right away if your belly swells, your cut  bleeds a lot, or you have trouble talking or breathing. This information is not intended to replace advice given to you by your health care provider. Make sure you discuss any questions you have with your healthcare provider. Document Revised: 09/27/2020 Document Reviewed: 09/27/2020 Elsevier Patient Education  2022 [place].     Moderate Conscious Sedation, Adult, Care After This sheet gives you information about how to care for yourself after your procedure. Your health care provider may also give you more specific instructions. If you have problems or questions, contact your health care provider. What can I expect after the procedure? After the procedure, it is common to have: Sleepiness for several hours. Impaired judgment for several hours. Difficulty with balance. Vomiting if you eat too soon. Follow these instructions at home: For the time period you were told by your health care provider: Rest. Do not participate in activities where you could fall or become injured. Do not drive or use machinery. Do not drink alcohol. Do not take sleeping pills or medicines that cause drowsiness. Do not make important decisions or sign legal documents. Do not take care of children on your own.        Eating and drinking Follow the diet recommended by your health care provider. Drink enough fluid to keep your urine pale yellow. If you vomit: Drink water, juice, or soup when you can drink without vomiting. Make sure you have little or no nausea before eating solid foods.    General instructions Take over-the-counter and prescription medicines only as told by your health care provider. Have a responsible adult stay with you for the time you are told. It is important to have someone help care for you until you are awake and alert. Do not smoke. Keep all follow-up visits as told by your health care provider. This is important. Contact a health care provider if: You are still sleepy or  having trouble with balance after 24 hours. You feel light-headed. You keep feeling nauseous or you keep vomiting. You develop a rash. You have a fever. You have redness or swelling around the IV site. Get help right away if: You have trouble breathing. You have new-onset confusion at home. Summary After the procedure, it is common to feel sleepy, have impaired judgment, or feel nauseous if you eat too soon. Rest after you get home. Know the things you should not do after the procedure. Follow the diet recommended by your health care provider and drink enough fluid to keep your urine pale yellow. Get help right away if you have trouble breathing or new-onset confusion at home. This information is not intended to replace advice given to you by your health care provider. Make sure you discuss any questions you have with your health care provider. Document Revised: 03/12/2020 Document Reviewed: 10/09/2019 Elsevier Patient Education  2021 [employer].

## 2022-05-02 NOTE — Procedures (Signed)
Vascular and Interventional Radiology Procedure Note  Patient: Bryce Adams DOB: [birth year] Medical Record Number: [id number] Note Date/Time: 05/02/22 9:27 AM   Performing Physician: Michaelle Birks, MD Assistant(s): None  Diagnosis: Liver mass. No Dx.  Procedure: LIVER MASS BIOPSY  Anesthesia: Conscious Sedation Complications: None Estimated Blood Loss: Minimal Specimens: Sent for Pathology  Findings:  Successful Ultrasound-guided biopsy of R hepatic lobe mass. A total of 4 samples were obtained. Hemostasis of the tract was achieved using Gelfoam Slurry Embolization.  Plan: Bed rest for 2 hours.  See detailed procedure note with images in PACS. The patient tolerated the procedure well without incident or complication and was returned to Short Stay in stable condition.    Michaelle Birks, MD [facility] Radiology   Pager. [place]

## 2022-05-03 ENCOUNTER — Encounter (HOSPITAL_COMMUNITY): Payer: Self-pay

## 2022-05-03 ENCOUNTER — Ambulatory Visit (HOSPITAL_COMMUNITY): Payer: No Typology Code available for payment source | Admitting: Hematology

## 2022-05-03 ENCOUNTER — Other Ambulatory Visit (HOSPITAL_COMMUNITY): Payer: Self-pay

## 2022-05-03 NOTE — Progress Notes (Signed)
Returned wife's phone call at her request regarding plavix and pain management medication. She reports that liver biopsy was yesterday, instructed to restart plavix tonight per Dr. Delton Coombes instructions. She reports that patient's pain remains uncontrolled. States that he is taking MSContin as prescribed, but is requiring increased frequency of Norco. She tells me that he has been taking it approximately every 4 hours but that it really only lasts him between 2.5-3hours. Dr. Delton Coombes made aware. Dr. Delton Coombes instructs to increase MSContin to two tablets twice daily and continue Norco Q4h PRN. Wife made aware and confirmed that they currently have enough medication on hand to follow those instructions until meet with Dr. Delton Coombes as scheduled on 05/08/22 at 1515.

## 2022-05-04 ENCOUNTER — Other Ambulatory Visit (HOSPITAL_COMMUNITY): Payer: Self-pay

## 2022-05-04 MED ORDER — LACTULOSE 20 GM/30ML PO SOLN: 20.0000 g | ORAL | 1 refills | Status: AC | PRN

## 2022-05-04 NOTE — Telephone Encounter (Signed)
Call received from patient's wife stating that the patient is tender at the site of the biopsy and that he is bloated. She states that his BP is normal but that he has not had a BM since prior to the biopsy procedure. We discussed that there should be no excruciating pain at the biopsy site, but that it can be sore and/or tender. We discussed that bloating can be caused by a number of factors, including constipation. Patient's wife feels that he is most likely constipated, telling me that his normal routine is to have at least 1 BM daily. Order placed for Lactulose to be taken Q3h PRN until BM occurs. Wife verbalized understanding. All questions addressed and answered.

## 2022-05-05 LAB — SURGICAL PATHOLOGY

## 2022-05-08 ENCOUNTER — Inpatient Hospital Stay (HOSPITAL_COMMUNITY): Payer: No Typology Code available for payment source | Attending: Hematology | Admitting: Hematology

## 2022-05-08 VITALS — BP 129/71 | HR 92 | Temp 97.0°F | Resp 18 | Ht 69.0 in | Wt 173.8 lb

## 2022-05-08 DIAGNOSIS — C781 Secondary malignant neoplasm of mediastinum: Secondary | ICD-10-CM | POA: Diagnosis not present

## 2022-05-08 DIAGNOSIS — K5900 Constipation, unspecified: Secondary | ICD-10-CM | POA: Insufficient documentation

## 2022-05-08 DIAGNOSIS — Z7984 Long term (current) use of oral hypoglycemic drugs: Secondary | ICD-10-CM | POA: Insufficient documentation

## 2022-05-08 DIAGNOSIS — C770 Secondary and unspecified malignant neoplasm of lymph nodes of head, face and neck: Secondary | ICD-10-CM | POA: Diagnosis not present

## 2022-05-08 DIAGNOSIS — Z79899 Other long term (current) drug therapy: Secondary | ICD-10-CM | POA: Insufficient documentation

## 2022-05-08 DIAGNOSIS — Z794 Long term (current) use of insulin: Secondary | ICD-10-CM | POA: Insufficient documentation

## 2022-05-08 DIAGNOSIS — Z5112 Encounter for antineoplastic immunotherapy: Secondary | ICD-10-CM | POA: Diagnosis present

## 2022-05-08 DIAGNOSIS — R160 Hepatomegaly, not elsewhere classified: Secondary | ICD-10-CM | POA: Diagnosis not present

## 2022-05-08 DIAGNOSIS — R0789 Other chest pain: Secondary | ICD-10-CM | POA: Diagnosis not present

## 2022-05-08 DIAGNOSIS — K769 Liver disease, unspecified: Secondary | ICD-10-CM

## 2022-05-08 DIAGNOSIS — C220 Liver cell carcinoma: Secondary | ICD-10-CM | POA: Insufficient documentation

## 2022-05-08 DIAGNOSIS — F17210 Nicotine dependence, cigarettes, uncomplicated: Secondary | ICD-10-CM | POA: Diagnosis not present

## 2022-05-08 NOTE — Progress Notes (Signed)
START OFF PATHWAY REGIMEN - Other   [id number]:Atezolizumab 1,200 mg IV D1 + Bevacizumab 15 mg/kg IV D1 q21 Days:   A cycle is every 21 Days:     Atezolizumab      Bevacizumab-xxxx   **Always confirm dose/schedule in your pharmacy ordering system**  Patient Characteristics: Intent of Therapy: Non-Curative / Palliative Intent, Discussed with Patient 

## 2022-05-08 NOTE — Patient Instructions (Addendum)
[place] at [facility] Discharge Instructions  You were seen and examined today by Dr. Delton Coombes.  Dr. Delton Coombes discussed your most recent lab work, PET scan and biopsy which revealed a Stage IV Hepatocellular Carcinoma (Liver Cancer).   This means that the cancer has spread outside of the liver and cannot be cured, but it can be managed with treatment.   Treatment will be a combination of two drugs: Avastin and Tecentriq. This is the first line of treatment. Treatment is given once every 3 weeks. There is a risk of bleeding and increased blood pressure related to Avastin. The most common side effect of Tecentriq is fatigue. The fatigue may last a few days after treatment.  Follow-up as scheduled.   Thank you for choosing [place] at [facility] to provide your oncology and hematology care.  To afford each patient quality time with our provider, please arrive at least 15 minutes before your scheduled appointment time.   If you have a lab appointment with the [facility] please come in thru the Main Entrance and check in at the main information desk.  You need to re-schedule your appointment should you arrive 10 or more minutes late.  We strive to give you quality time with our providers, and arriving late affects you and other patients whose appointments are after yours.  Also, if you no show three or more times for appointments you may be dismissed from the clinic at the providers discretion.     Again, thank you for choosing [facility].  Our hope is that these requests will decrease the amount of time that you wait before being seen by our physicians.       _____________________________________________________________  Should you have questions after your visit to [facility], please contact our office at [phone] and follow the prompts.  Our office hours are 8:00 a.m. and 4:30 p.m. Monday - Friday.   Please note that voicemails left after 4:00 p.m. may not be returned until the following business day.  We are closed weekends and major holidays.  You do have access to a nurse 24-7, just call the main number to the clinic [phone] and do not press any options, hold on the line and a nurse will answer the phone.    For prescription refill requests, have your pharmacy contact our office and allow 72 hours.    Due to Covid, you will need to wear a mask upon entering the hospital. If you do not have a mask, a mask will be given to you at the Main Entrance upon arrival. For doctor visits, patients may have 1 support person age 14 or older with them. For treatment visits, patients can not have anyone with them due to social distancing guidelines and our immunocompromised population.

## 2022-05-08 NOTE — Progress Notes (Signed)
[place], [place] [id number]   CLINIC:  Medical Oncology/Hematology  PCP:  Eber Hong, MD [id number] / [place] [address] [phone]   REASON FOR VISIT:  Follow-up for right hepatic lobe mass  PRIOR THERAPY: none  NGS Results: not done  CURRENT THERAPY: under work-up  BRIEF ONCOLOGIC HISTORY:  Oncology History   No history exists.    CANCER STAGING:  Cancer Staging  No matching staging information was found for the patient.  INTERVAL HISTORY:  Bryce Adams, a 71 y.o. male, returns for routine follow-up of his right hepatic lobe mass. Bryce Adams was last seen on 04/13/2022.   Today he reports feeling good. He reports his right sided chest wall pain and right upper back pain is well controlled with hydrocodone every 5 hours. He denies history of autoimmune disorders. His constipation has improved.   REVIEW OF SYSTEMS:  Review of Systems  Constitutional:  Positive for fatigue. Negative for appetite change.  Respiratory:  Positive for shortness of breath.   Cardiovascular:  Positive for chest pain (right upper).  Gastrointestinal:  Positive for constipation (improved).  Musculoskeletal:  Positive for back pain (4/10).  All other systems reviewed and are negative.   PAST MEDICAL/SURGICAL HISTORY:  Past Medical History:  Diagnosis Date   Diabetes mellitus without complication ([place])    Hyperlipemia    Hypertension    Renal disorder    Stroke [facility])    Thyroid disease    Past Surgical History:  Procedure Laterality Date   CARDIAC CATHETERIZATION     CARPAL TUNNEL RELEASE     HAND TENDON SURGERY      SOCIAL HISTORY:  Social History   Socioeconomic History   Marital status: Married    Spouse name: Not on file   Number of children: Not on file   Years of education: Not on file   Highest education level: Not on file  Occupational History   Not on file  Tobacco Use   Smoking status: Every Day    Packs/day:  2.00    Types: Cigarettes   Smokeless tobacco: Never  Substance and Sexual Activity   Alcohol use: Yes    Alcohol/week: 1.0 standard drink of alcohol    Types: 1 Cans of beer per week    Comment: occasionally    Drug use: No   Sexual activity: Not on file  Other Topics Concern   Not on file  Social History Narrative   Not on file   Social Determinants of Health   Financial Resource Strain: Not on file  Food Insecurity: Not on file  Transportation Needs: Not on file  Physical Activity: Not on file  Stress: Not on file  Social Connections: Not on file  Intimate Partner Violence: Not on file    FAMILY HISTORY:  Family History  Problem Relation Age of Onset   Colon polyps Mother    Colon cancer Mother    Esophageal cancer Neg Hx    Rectal cancer Neg Hx    Stomach cancer Neg Hx     CURRENT MEDICATIONS:  Current Outpatient Medications  Medication Sig Dispense Refill   amLODipine (NORVASC) 2.5 MG tablet SMARTSIG:1 Tablet(s) By Mouth Every Evening     atorvastatin (LIPITOR) 20 MG tablet Take 20 mg by mouth every morning.     clopidogrel (PLAVIX) 75 MG tablet Take 1 tablet (75 mg total) by mouth daily. 30 tablet 11   Continuous Blood Gluc Sensor (DEXCOM  G6 SENSOR) MISC Apply topically daily.     famotidine (PEPCID) 20 MG tablet Take 20 mg by mouth daily.     hydrochlorothiazide (HYDRODIURIL) 25 MG tablet Take 25 mg by mouth every morning.     HYDROcodone-acetaminophen (NORCO) 10-325 MG tablet Take 1 tablet by mouth 4 (four) times daily as needed. 120 tablet 0   Insulin Pen Needle (B-D UF III MINI PEN NEEDLES) 31G X 5 MM MISC USE EVERY DAY     irbesartan (AVAPRO) 300 MG tablet Take 1 tablet by mouth daily.     Lactulose 20 GM/30ML SOLN Take 30 mLs (20 g total) by mouth every 3 (three) hours as needed. 450 mL 1   levothyroxine (SYNTHROID, LEVOTHROID) 50 MCG tablet Take 50 mcg by mouth every morning.     Magnesium Oxide 400 MG CAPS Take 1 capsule by mouth 2 (two) times daily.      metFORMIN (GLUCOPHAGE) 1000 MG tablet Take 1,000 mg by mouth 2 (two) times daily with a meal.     morphine (MS CONTIN) 15 MG 12 hr tablet Take 1 tablet (15 mg total) by mouth every 12 (twelve) hours. 60 tablet 0   Multiple Vitamin (MULTIVITAMIN WITH MINERALS) TABS Take 1 tablet by mouth daily.     omega-3 acid ethyl esters (LOVAZA) 1 G capsule Take 2 g by mouth 2 (two) times daily.     ONETOUCH VERIO test strip daily.     sitaGLIPtin (JANUVIA) 100 MG tablet Take 100 mg by mouth daily.     SPS 15 GM/60ML suspension Take by mouth.     valsartan (DIOVAN) 160 MG tablet Take 160 mg by mouth daily.     VICTOZA 18 MG/3ML SOPN Inject 1.8 mg into the skin every evening.  4   No current facility-administered medications for this visit.    ALLERGIES:  Allergies  Allergen Reactions   Fentanyl     Difficulty breathing    PHYSICAL EXAM:  Performance status (ECOG): 1 - Symptomatic but completely ambulatory  Vitals:   05/08/22 1510  BP: 129/71  Pulse: 92  Resp: 18  Temp: (!) 97 F (36.1 C)  SpO2: 98%   Wt Readings from Last 3 Encounters:  05/08/22 173 lb 12.8 oz (78.8 kg)  05/02/22 173 lb (78.5 kg)  11/25/21 180 lb 6.4 oz (81.8 kg)   Physical Exam Vitals reviewed.  Constitutional:      Appearance: Normal appearance.  Cardiovascular:     Rate and Rhythm: Normal rate and regular rhythm.     Pulses: Normal pulses.     Heart sounds: Normal heart sounds.  Pulmonary:     Effort: Pulmonary effort is normal.     Breath sounds: Normal breath sounds.  Neurological:     General: No focal deficit present.     Mental Status: He is alert and oriented to person, place, and time.  Psychiatric:        Mood and Affect: Mood normal.        Behavior: Behavior normal.      LABORATORY DATA:  I have reviewed the labs as listed.     Latest Ref Rng & Units 05/02/2022    7:42 AM 04/13/2022    9:27 AM 09/14/2021    5:26 AM  CBC  WBC 4.0 - 10.5 K/uL 12.7  15.5  10.0   Hemoglobin 13.0 - 17.0  g/dL 11.7  13.3  13.7   Hematocrit 39.0 - 52.0 % 34.7  39.7  39.5  Platelets 150 - 400 K/uL 249  395  236       Latest Ref Rng & Units 05/02/2022    7:42 AM 04/13/2022    9:27 AM 09/14/2021    5:26 AM  CMP  Glucose 70 - 99 mg/dL 149  163  220   BUN 8 - 23 mg/dL '31  27  31   '$ Creatinine 0.61 - 1.24 mg/dL 1.04  1.22  1.07   Sodium 135 - 145 mmol/L 130  130  133   Potassium 3.5 - 5.1 mmol/L 5.2  5.5  4.3   Chloride 98 - 111 mmol/L 97  97  103   CO2 22 - 32 mmol/L '23  24  23   '$ Calcium 8.9 - 10.3 mg/dL 9.3  9.4  9.1   Total Protein 6.5 - 8.1 g/dL 7.3  8.1    Total Bilirubin 0.3 - 1.2 mg/dL 0.7  0.6    Alkaline Phos 38 - 126 U/L 255  295    AST 15 - 41 U/L 47  47    ALT 0 - 44 U/L 30  36      DIAGNOSTIC IMAGING:  I have independently reviewed the scans and discussed with the patient. US BIOPSY (LIVER)  Result Date: 05/03/2022 INDICATION: New large liver mass.  No diagnosis. EXAM: ULTRASOUND GUIDED LIVER LESION BIOPSY COMPARISON:  PET-CT, 04/20/2022. MR abdomen, 04/06/2022. US Abdomen, 03/20/2022. MEDICATIONS: None ANESTHESIA/SEDATION: Dilaudid 1 mcg IV; Versed 1 mg IV Total Moderate Sedation time:  22 minutes. The patient's level of consciousness and vital signs were monitored continuously by radiology nursing throughout the procedure under my direct supervision. COMPLICATIONS: None immediate. PROCEDURE: Informed written consent was obtained from the patient after a discussion of the risks, benefits and alternatives to treatment. The patient understands and consents the procedure. A timeout was performed prior to the initiation of the procedure. Ultrasound scanning was performed of the right upper abdominal quadrant demonstrates heterogeneously echogenic, large RIGHT liver mass. The RIGHT hepatic lobe mass was selected for biopsy and the procedure was planned. The RIGHT upper abdominal quadrant was prepped and draped in the usual sterile fashion. The overlying soft tissues were anesthetized with  1% lidocaine with epinephrine. A 17 gauge, 11 cm co-axial needle was advanced into a peripheral aspect of the lesion. This was followed by 4 core biopsies with an 18 gauge core device under direct ultrasound guidance. The coaxial needle tract was embolized with a small amount of Gel-Foam slurry and superficial hemostasis was obtained with manual compression. Post procedural scanning was negative for definitive area of hemorrhage or additional complication. A dressing was placed. The patient tolerated the procedure well without immediate post procedural complication. IMPRESSION: Successful ultrasound guided core needle biopsy of the RIGHT hepatic lobe mass, as above. Michaelle Birks, MD [facility] Radiology Electronically Signed   By: Michaelle Birks M.D.   On: 05/03/2022 08:23   NM PET Image Initial (PI) Skull Base To Thigh (F-18 FDG)  Result Date: 04/21/2022 CLINICAL DATA:  Initial treatment strategy for liver mass. EXAM: NUCLEAR MEDICINE PET SKULL BASE TO THIGH TECHNIQUE: 9.3 mCi F-18 FDG was injected intravenously. Full-ring PET imaging was performed from the skull base to thigh after the radiotracer. CT data was obtained and used for attenuation correction and anatomic localization. Fasting blood glucose: 170 mg/dl COMPARISON:  Abdominal MRI 04/06/2022 FINDINGS: Mediastinal blood pool activity: SUV max Liver activity: SUV max NA NECK: No hypermetabolic lymph nodes in the neck. Incidental  CT findings: none CHEST: Hypermetabolic RIGHT lower paratracheal and subcarinal lymph nodes. For example RIGHT lower paratracheal node measuring 17 mm SUV max equal 11.2. Hypermetabolic subcarinal node measures 17 mm with SUV max equal 6.9. Small RIGHT supraclavicular node measuring 9 mm (image 60/3) with SUV max equal 5.2 Small LEFT supraclavicular node more superficial measuring 6 mm (image 60/3) with SUV max equal 3.5. No suspicious pulmonary nodules. Band of benign atelectasis  of RIGHT lung base. Incidental CT findings: none ABDOMEN/PELVIS: Large hypermetabolic mass in the RIGHT hepatic lobe corresponds to the mass lesion on comparison MRI. Mass measures approximately 11 cm x 11 cm with SUV max equal 9.1. Mass is uniformly hypermetabolic with some central photopenia. There is medial extension of the hypermetabolic activit from the RIGHT hepatic lobe mass into the RIGHT portal vein (image 142). Hypermetabolic periportal nodes with SUV max equal 7.6. No hypermetabolic pelvic adenopathy. Normal adrenal glands.  Normal pancreas. Incidental CT findings: Atherosclerotic calcification of the aorta. SKELETON: Mild hypermetabolic activity associated with the T2 vertebral body (image 64) with SUV max equal 3.7. Mild sclerosis at this level. Second lesion in the LEFT aspect of the L5 vertebral body with SUV max equal 4.7 (image 201). No CT findings. Incidental CT findings: none IMPRESSION: 1. Hypermetabolic mass within the RIGHT hepatic lobe is most suggestive of primary hepatic carcinoma (hepatocellular carcinoma). 2. Evidence of hypermetabolic tumor extension into the RIGHT portal vein. 3. 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 4. Thoracic hypermetabolic nodal metastasis to the mediastinum. 5. Small hypermetabolic supraclavicular nodes also consistent with nodal metastasis. 6. Hypermetabolic lesions at T2 and L5 concerning for skeletal metastasis. Electronically Signed   By: Suzy Bouchard M.D.   On: 04/21/2022 17:14     ASSESSMENT:   Right hepatic lobe mass, portacaval lymphadenopathy and bone lesions: - Weight loss: 20 pounds in 6 months, decreased appetite - Reports helped going downhill since he had COVID in September 2022, 1 month later had stroke. - Elevated alkaline phosphatase noted by Dr. Brynda Greathouse - Ultrasound abdomen (03/20/2022): Right hepatic mass - MRI abdomen (04/06/2022): 11 x 12 x 8.5 cm large irregular poorly defined mass at the dome of the right hepatic lobe  with numerous satellite tumors in the right hepatic lobe.  Associated tumor thrombus involving right portal vein and its branches.  Lymphadenopathy in the portacaval region.  2 enhancing bone lesions at L5 and T11 vertebral body.  Splenomegaly measuring 14 cm. - PET scan (04/21/2022): Hypermetabolic mass in the right hepatic lobe, hypermetabolic tumor extension into the right portal vein, hypermetabolic nodal metastasis to porta hepatis and mediastinum and small supraclavicular nodes.  Hypermetabolic lesions at T2 and L5. - Pathology of liver biopsy: Poorly differentiated HCC.  Tumor is positive for glypican-3 3 and HepPar 1, negative for arginase 1.  Tumor is partially reactive for CK7 and has weak CDX2 reactivity.  Cells are negative for CK20 and MOC 31.  CT 34 shows aberrant sinusoidal reactivity in the tumor which supports diagnosis of [place] Pugh class A (6 points).    Social/family history: - Lives at home with his wife.  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 - 2 pack/day smoker for last 50 years. - Mother died of colon cancer.   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  Hypermetabolic lesions at T2 and L5 concerning for skeletal metastasis. - We reviewed the liver biopsy results which showed poorly differentiated carcinoma consistent with [place]. - Tumor markers show AFP [id number]. - We talked about treatment of metastatic [place] in the palliative setting. - We talked about first-line treatment with combination of Atezolizumab and bevacizumab given once every 21 days.  I quoted median OS of around 19 months. - We discussed side effects in detail.  Literature was  given to the patient and his wife. - He does not have a history of esophageal varices.  However due to increased risk of bleeding from varices with bevacizumab, I have recommended that EGD be done within the next 2 to 3 months.  We will ask Dr. Enis Gash to schedule it. - We also talked about need for port placement.  He is agreeable.  We will make referral to our surgical colleagues. - We will check for viral hepatitis etiologies. - He will likely start cycle 1 on 05/15/2022.  I will see him back prior to cycle 2.  We will reevaluate with CT CAP after 2 to 3 months.  2.  Right-sided chest wall pain: - Continue MS Contin 30 mg twice daily.  Fentanyl patch caused him difficulty breathing and swallowing. - Continue hydrocodone 10/325 every 5 hours as needed.  3.  Constipation: - He was recommended to start Colace daily. - Continue lactulose as needed.     Orders placed this encounter:  No orders of the defined types were placed in this encounter.    Derek Jack, MD [place] [phone]   I, Thana Ates, am acting as a scribe for Dr. Derek Jack.  I, Derek Jack MD, have reviewed the above documentation for accuracy and completeness, and I agree with the above.

## 2022-05-09 ENCOUNTER — Other Ambulatory Visit (HOSPITAL_COMMUNITY): Payer: Self-pay

## 2022-05-09 MED ORDER — HYDROCODONE-ACETAMINOPHEN 10-325 MG PO TABS: 1.0000 | ORAL_TABLET | ORAL | 0 refills | Status: DC | PRN

## 2022-05-09 MED ORDER — MORPHINE SULFATE ER 30 MG PO TBCR: 30.0000 mg | EXTENDED_RELEASE_TABLET | Freq: Two times a day (BID) | ORAL | 0 refills | Status: DC

## 2022-05-09 NOTE — Addendum Note (Signed)
Addended by: Brien Mates on: 05/09/2022 08:43 AM   Modules accepted: Orders

## 2022-05-09 NOTE — Addendum Note (Signed)
Addended by: Brien Mates on: 05/09/2022 08:44 AM   Modules accepted: Orders

## 2022-05-09 NOTE — Patient Instructions (Addendum)
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 We will treat you in the clinic every 3 weeks with a combination of drugs. Those drugs are Avastin and Tecentriq. Avastin is a monoclonal antibody that works by cutting off the blood supply to the cancer. Tecentriq is an immunotherapy drug that works with your body's own immune system to help it recognize and kill off the cancer cells. The intent of treatment is to shrink and control this cancer, prevent it from spreading further, and to alleviate any symptoms you may be having related to this disease. You will see the doctor regularly throughout treatment.  We will obtain blood work from you prior to every treatment and monitor your results to make sure it is safe to give your treatment. The doctor monitors your response to treatment by the way you are feeling, your blood work, and by obtaining scans periodically.  There will be wait times while you are here for treatment. It will take about 30 minutes to 1 hour for your lab work to result.  Then there will be wait times while pharmacy mixes your medications.    Bevacizumab (Avastin)  About This Drug Bevacizumab is used to treat cancer. It is given in the vein (IV). It will take 30 minutes to infuse.   Possible Side Effects  Teary eyes   Runny/stuffy nose   Nosebleed   Changes in the way food and drinks taste   Headache   Back pain   Protein in your urine   Bleeding in your rectum   Dry skin   A red skin rash which can be peeling or scaling   High blood pressure  Note: Each of the side effects above was reported in 10% or greater of patients treated with bevacizumab-xxxx. Not all possible side effects are included above.  Warnings and Precautions   Perforation or fistula- an abnormal hole in your stomach, intestine, esophagus, or other organ, which can be life-threatening  Slow wound healing, which can be  life-threatening  Abnormal bleeding which can be life-threatening - symptoms may be coughing up blood, throwing up blood (may look like coffee grounds), red or black tarry bowel movements, abnormally heavy menstrual flow, nosebleeds or any other unusual bleeding.  Blood clots and events such as stroke and heart attack. A blood clot in your leg may cause your leg to swell, appear red and warm, and/or cause pain. A blood clot in your lungs may cause trouble breathing, pain when breathing, and/or chest pain.  Severe high blood pressure  Changes in your central nervous system can happen. The central nervous system is made up of your brain and spinal cord. You could feel extreme tiredness, agitation, confusion, hallucinations   This drug may interact with other medicines. Tell your doctor and pharmacist about all the medicines and dietary supplements (vitamins, minerals, herbs and others) that you are taking at this time. Also, check with your doctor or pharmacist before starting any new prescription or over-the-counter medicines, or dietary supplements to make sure that there are no interactions.  When to Call the Doctor Call your doctor or nurse if you have any of these symptoms and/or any new or unusual symptoms:   Fever of 100.4 F (38 C) or higher   Chills   Confusion and/or agitation   Hallucinations   Trouble understanding or speaking   Headache that does not go away   Nose bleed that doesn't stop bleeding after 10 -15  minutes   Feeling dizzy or lightheaded   Blurry vision or changes in your eyesight   Difficulty swallowing   Easy bleeding or bruising   Blood in your urine, vomit (bright red or coffee-ground) and/or stools ( bright red, or black/tarry)   Coughing up blood   Wheezing and/or trouble breathing   Chest pain or symptoms of a heart attack. Most heart attacks involve pain in the center of the chest that lasts more than a few minutes. The pain may go away and come  back. It can feel like pressure, squeezing, fullness, or pain. Sometimes pain is felt in one or both arms, the back, neck, jaw, or stomach. If any of these symptoms last 2 minutes, call 911.   Symptoms of a stroke such as sudden numbness or weakness of your face, arm, or leg, mostly on one side of your body; sudden confusion, trouble speaking or understanding; sudden trouble seeing in one or both eyes; sudden trouble walking, feeling dizzy, loss of balance or coordination; or sudden, bad headache with no known cause. If you have any of these symptoms for 2 minutes, call 911.   Numbness or lack of strength to your arms, legs, face, or body   Nausea that stops you from eating or drinking and/or relieved by prescribed medicine   Throwing up   Pain in your abdomen that does not go away   Foamy or bubbly-looking urine   Signs of infusion reaction: fever or shaking chills, flushing, facial swelling, feeling dizzy, headache, trouble breathing, rash, itching, chest tightness, or chest pain.   Pain that does not go away or is not relieved by prescribed medicine   Your leg or arm is swollen, red, warm and/or painful   Swelling of arms, hands, legs and/or feet   Weight gain of 5 Adams in one week (fluid retention)   If you think you may be pregnant  Reproduction Warnings  Pregnancy warning: This drug can have harmful effects on the unborn baby. Women of child bearing potential should use effective methods of birth control during your cancer treatment and for 6 months after treatment. In women, changes in your ovaries may happen that may cause menstrual bleeding to become irregular or stop, do not assume you cannot get pregnant. Let your doctor know right away if you think you may be pregnant   Breastfeeding warning: Women should not breastfeed during treatment and for 6 months after treatment because this drug could enter the breast milk and cause harm to a breastfeeding baby.    Fertility  warning: In women, this drug may affect your ability to have children in the future. Talk with your doctor or nurse if you plan to have children. Ask for information on egg banking.   Atezolizumab Bryce Adams)  About This Drug Bryce Adams is used to treat cancer. It is given by the vein (IV). The first infusion takes one hour. All subsequent infusions will be given over 30 minutes.   Possible Side Effects  Nausea  Tiredness and weakness  Decreased appetite (decreased hunger)  Cough  Trouble breathing  Note: Each of the side effects above was reported in 20% or greater of patients treated with atezolizumab. Your side effects may be different if you are taking atezolizumab in combination with another agent. Not all possible side effects are included above.  Warnings and Precautions  This drug works with your immune system and can cause inflammation (swelling) in any of your organs and tissues and can change how  they work. This may put you at risk for developing serious medical problems, which can be life-threatening.   Inflammation of the lungs which can be life-threatening. You may have a dry cough or trouble breathing.   Severe changes in your liver function which can cause liver failure and be life-threatening.   Colitis which is swelling in the colon. The symptoms are diarrhea, stomach cramping, and sometimes blood in the bowel movements.   Changes in your central nervous system can happen. The central nervous system is made up of your brain and spinal cord. You could feel extreme tiredness, agitation, confusion, hallucinations (see or hear things that are not there), trouble understanding or speaking, loss of control of your bowels or bladder, eyesight changes, numbness or lack of strength to your arms, legs, face, or body, and coma. If you start to have any of these symptoms let your doctor know right away.   This drug may affect your hormone glands (thyroid, adrenals, pituitary and  pancreas).   Blood sugar levels may change, and you may develop diabetes. If you already have diabetes, changes may need to be made to your diabetes medication.   Severe infections, including viral, bacterial and fungal, which can be life-threatening   While you are getting this drug in your vein (IV), you may have a reaction to the drug. Sometimes you may be given medication to stop or lessen these side effects. Your nurse will check you closely for these signs: fever or shaking chills, flushing, facial swelling, feeling dizzy, headache, trouble breathing, rash, itching, chest tightness, or chest pain. These reactions may happen after your infusion. If this happens, call 911 for emergency care.  Note: Some of the side effects above are very rare. If you have concerns and/or questions, please discuss them with your medical team.  Important Information  This drug may be present in the saliva, tears, sweat, urine, stool, vomit, semen, and vaginal secretions. Talk to your doctor and/or your nurse about the necessary precautions to take during this time.   Treating Side Effects   Manage tiredness by pacing your activities for the day.   Be sure to include periods of rest between energy-draining activities.   To decrease the risk of infection, wash your hands regularly.   Avoid close contact with people who have a cold, the flu, or other infections.   Take your temperature as your doctor or nurse tells you, and whenever you feel like you may have a fever.   Drink plenty of fluids (a minimum of eight glasses per day is recommended).   Ask your doctor or nurse about medicine that is available to help stop or lessen loose bowel movements.   To help with nausea, eat small, frequent meals instead of three large meals a day. Choose foods and drinks that are at room temperature. Ask your nurse or doctor about other helpful tips and medicine that is available to help stop or lessen these symptoms.   To help with decreased appetite, eat small, frequent meals. Eat foods high in calories and protein, such as meat, poultry, fish, dry beans, tofu, eggs, nuts, milk, yogurt, cheese, ice cream, pudding, and nutritional supplements.   Consider using sauces and spices to increase taste. Daily exercise, with your doctor's approval, may increase your appetite.   If you have diabetes, keep good control of your blood sugar level. Tell your nurse or your doctor if your glucose levels are higher or lower than normal.   Keeping your pain  under control is important to your well-being. Please tell your doctor or nurse if you are experiencing pain.   If you get a rash do not put anything on it unless your doctor or nurse says you may. Keep the area around the rash clean and dry. Ask your doctor for medicine if your rash bothers you.   If you have numbness and tingling in your hands and feet, be careful when cooking, walking, and handling sharp objects and hot liquids.   Infusion reactions may happen after your infusion. If this happens, call 911 for emergency care.  Food and Drug Interactions  There are no known interactions of atezolizumab with food.   This drug may interact with other medicines. Tell your doctor and pharmacist about all the prescription and over-the-counter medicines and dietary supplements (vitamins, minerals, herbs and others) that you are taking at this time. Also, check with your doctor or pharmacist before starting any new prescription or over-the-counter medicines, or dietary supplements to make sure that there are no interactions.  When to Call the Doctor  Call your doctor or nurse if you have any of these symptoms and/or any new or unusual symptoms:   Fever of 100.4 F (38 C) or higher   Chills   Tiredness that interferes with your daily activities   Feeling dizzy or lightheaded   Pain in your chest   Dry cough   Coughing up yellow, green, or bloody mucus   Wheezing  or trouble breathing   Feeling that your heart is beating in a fast or not normal way (palpitations)   Confusion and/or agitation   Hallucinations   Trouble understanding or speaking   Numbness or lack of strength to your arms, legs, face, or body   Blurred vision or other changes in eyesight   Diarrhea, 4 times in one day or diarrhea with lack of strength or a feeling of being dizzy   Pain in your abdomen that does not go away   Blood in your stool   Nausea that stops you from eating or drinking and/or is not relieved by prescribed medicines   Throwing up   Lasting loss of appetite or rapid weight loss of five Adams in a week   Abnormal blood sugar   Unusual thirst, passing urine often, headache, sweating, shakiness, irritability   Pain that does not go away, or is not relieved by prescribed medicines   Numbness, tingling, or pain your hands and feet   Extreme weakness that interferes with normal activities   A new rash or a rash that is not relieved by prescribed medicines   Signs of infusion reaction: fever or shaking chills, flushing, facial swelling, feeling dizzy, headache, trouble breathing, rash, itching, chest tightness, or chest pain. If this happens, call 911 for emergency care.   Signs of possible liver problems: dark urine, pale bowel movements, bad stomach pain, feeling very tired and weak, unusual itching, or yellowing of the eyes or skin   If you think you may be pregnant  Reproduction Warnings   Pregnancy warning: This drug can have harmful effects on the unborn baby. Women of childbearing potential should use effective methods of birth control during your cancer treatment and for at least 5 months after treatment. Let your doctor know right away if you think you may be pregnant.   Breastfeeding warning: It is not known if this drug passes into breast milk. For this reason, Women should not breastfeed during treatment and for at least  5 months after  treatment because this drug could enter the breast milk and cause harm to a breastfeeding baby.   Fertility warning: In women, this drug may affect your ability to have children in the future. Talk with your doctor or nurse if you plan to have children. Ask for information on egg banking.   SELF CARE ACTIVITIES WHILE ON IMMUNOTHERAPY:  Hydration Increase your fluid intake and drink at least 8 to 12 cups (64 ounces) of water/decaffeinated beverages per day after treatment. You can still have your cup of coffee or soda but these beverages do not count as part of your 8 to 12 cups that you need to drink daily. No alcohol intake.  Medications Continue taking your normal prescription medication as prescribed.  If you start any new herbal or new supplements please let us know first to make sure it is safe.  Skin Care Always use sunscreen that has not expired and with SPF (Sun Protection Factor) of 50 or higher. Wear hats to protect your head from the sun. Remember to use sunscreen on your hands, ears, face, & feet.  Use good moisturizing lotions such as udder cream, eucerin, or even Vaseline. Some chemotherapies can cause dry skin, color changes in your skin and nails.    Avoid long, hot showers or baths. Use gentle, fragrance-free soaps and laundry detergent. Use moisturizers, preferably creams or ointments rather than lotions because the thicker consistency is better at preventing skin dehydration. Apply the cream or ointment within 15 minutes of showering. Reapply moisturizer at night, and moisturize your hands every time after you wash them.  Infection Prevention Please wash your hands for at least 30 seconds using warm soapy water. Handwashing is the #1 way to prevent the spread of germs. Stay away from sick people or people who are getting over a cold. If you develop respiratory systems such as green/yellow mucus production or productive cough or persistent cough let us know and we will see if  you need an antibiotic. It is a good idea to keep a pair of gloves on when going into grocery stores/[employer] to decrease your risk of coming into contact with germs on the carts, etc. Carry alcohol hand gel with you at all times and use it frequently if out in public. If your temperature reaches 100.5 or higher please call the clinic and let us know.  If it is after hours or on the weekend please go to the ER if your temperature is over 100.4.  Please have your own personal thermometer at home to use.    Sex and bodily fluids If you are going to have sex, a condom must be used to protect the person that isn't taking immunotherapy. For a few days after treatment, immunotherapy can be excreted through your bodily fluids.  When using the toilet please close the lid and flush the toilet twice.  Do this for a few day after you have had immunotherapy.   Contraception It is not known for sure whether or not immunotherapy drugs can be passed on through semen or secretions from the vagina. Because of this some doctors advise people to use a barrier method if you have sex during treatment. This applies to vaginal, anal or oral sex.  Generally, doctors advise a barrier method only for the time you are actually having the treatment and for about a week after your treatment.  Advice like this can be worrying, but this does not mean that you have to avoid  being intimate with your partner. You can still have close contact with your partner and continue to enjoy sex.   Foods to avoid Some foods have a higher risk of becoming tainted with bacteria. These include: Unwashed fresh fruit and vegetables, especially leafy vegetables that can hide dirt and other contaminants Raw sprouts, such as alfalfa sprouts Raw or undercooked beef, especially ground beef, or other raw or undercooked meat and poultry Fatty, fried, or spicy foods immediately before or after treatment.  These can sit heavy on your stomach and make you  feel nauseous. Raw or undercooked shellfish, such as oysters. Sushi and sashimi, which often contain raw fish.  Unpasteurized beverages, such as unpasteurized fruit juices, raw milk, raw yogurt, or cider Undercooked eggs, such as soft boiled, over easy, and poached; raw, unpasteurized eggs; or foods made with raw egg, such as homemade raw cookie dough and homemade mayonnaise  Simple steps for food safety  Shop smart. Do not buy food stored or displayed in an unclean area. Do not buy bruised or damaged fruits or vegetables. Do not buy cans that have cracks, dents, or bulges. Pick up foods that can spoil at the end of your shopping trip and store them in a cooler on the way home.  Prepare and clean up foods carefully. Rinse all fresh fruits and vegetables under running water, and dry them with a clean towel or paper towel. Clean the top of cans before opening them. After preparing food, wash your hands for 20 seconds with hot water and soap. Pay special attention to areas between fingers and under nails. Clean your utensils and dishes with hot water and soap. Disinfect your kitchen and cutting boards using 1 teaspoon of liquid, unscented bleach mixed into 1 quart of water.    Dispose of old food. Eat canned and packaged food before its expiration date (the "use by" or "best before" date). Consume refrigerated leftovers within 3 to 4 days. After that time, throw out the food. Even if the food does not smell or look spoiled, it still may be unsafe. Some bacteria, such as Listeria, can grow even on foods stored in the refrigerator if they are kept for too long.  Take precautions when eating out. At restaurants, avoid buffets and salad bars where food sits out for a long time and comes in contact with many people. Food can become contaminated when someone with a virus, often a norovirus, or another "bug" handles it. Put any leftover food in a "to-go" container yourself, rather than having the  server do it. And, refrigerate leftovers as soon as you get home. Choose restaurants that are clean and that are willing to prepare your food as you order it cooked.    SYMPTOMS TO REPORT AS SOON AS POSSIBLE AFTER TREATMENT:  FEVER GREATER THAN 100.4 F CHILLS WITH OR WITHOUT FEVER UNUSUAL SHORTNESS OF BREATH URINARY PROBLEMS BOWEL PROBLEMS (uncontrolled diarrhea) UNUSUAL RASH     Wear comfortable clothing and clothing appropriate for easy access to any Portacath or PICC line. Let us know if there is anything that we can do to make your therapy better!   What to do if you need assistance after hours or on the weekends: CALL [phone].  HOLD on the line, do not hang up.  You will hear multiple messages but at the end you will be connected with a nurse triage line.  They will contact the doctor if necessary.  Most of the time they will be able to assist  you.  Do not call the hospital operator.    I have been informed and understand all of the instructions given to me and have received a copy. I have been instructed to call the clinic [phone] or my family physician as soon as possible for continued medical care, if indicated. I do not have any more questions at this time but understand that I may call the [facility] or the Patient Navigator at [phone] during office hours should I have questions or need assistance in obtaining follow-up care.

## 2022-05-11 ENCOUNTER — Encounter (HOSPITAL_COMMUNITY): Payer: Self-pay | Admitting: Hematology

## 2022-05-11 ENCOUNTER — Encounter (HOSPITAL_COMMUNITY): Payer: Self-pay

## 2022-05-11 ENCOUNTER — Inpatient Hospital Stay (HOSPITAL_COMMUNITY): Payer: No Typology Code available for payment source

## 2022-05-11 DIAGNOSIS — C220 Liver cell carcinoma: Secondary | ICD-10-CM

## 2022-05-11 DIAGNOSIS — Z95828 Presence of other vascular implants and grafts: Secondary | ICD-10-CM

## 2022-05-11 HISTORY — DX: Presence of other vascular implants and grafts: Z95.828

## 2022-05-11 MED ORDER — LIDOCAINE-PRILOCAINE 2.5-2.5 % EX CREA: TOPICAL_CREAM | CUTANEOUS | 3 refills | Status: AC

## 2022-05-11 MED ORDER — PROCHLORPERAZINE MALEATE 10 MG PO TABS: 10.0000 mg | ORAL_TABLET | Freq: Four times a day (QID) | ORAL | 1 refills | Status: DC | PRN

## 2022-05-11 NOTE — Progress Notes (Signed)
Prior Authorization of Morphine Sulfate '30mg'$  tablets approved from 05/08/22 through 05/11/23. Copy of approval letter to be scanned into patient's chart.

## 2022-05-11 NOTE — Progress Notes (Signed)
Immunotherapy education packet given and discussed with pt and spouse in detail.  Discussed diagnosis, staging, tx regimen, and intent of tx.  Reviewed immunotherapy medications and side effects.  Instructed on how to manage side effects at home, and when to call the clinic.  Importance of fever/chills discussed with pt and spouse. Discussed precautions to implement at home after receiving tx, as well as self care strategies. Phone numbers provided for clinic during regular working hours, also how to reach the clinic after hours and on weekends. Pt and spouse provided the opportunity to ask questions - all questions answered to pt's and spouse's satisfaction.

## 2022-05-15 ENCOUNTER — Inpatient Hospital Stay (HOSPITAL_COMMUNITY): Payer: No Typology Code available for payment source

## 2022-05-15 ENCOUNTER — Other Ambulatory Visit (HOSPITAL_COMMUNITY): Payer: Self-pay | Admitting: *Deleted

## 2022-05-15 ENCOUNTER — Encounter (HOSPITAL_COMMUNITY): Payer: Self-pay

## 2022-05-15 VITALS — BP 131/64 | HR 85 | Temp 97.8°F | Resp 18 | Ht 68.7 in | Wt 168.4 lb

## 2022-05-15 DIAGNOSIS — Z5112 Encounter for antineoplastic immunotherapy: Secondary | ICD-10-CM | POA: Diagnosis not present

## 2022-05-15 DIAGNOSIS — C220 Liver cell carcinoma: Secondary | ICD-10-CM

## 2022-05-15 DIAGNOSIS — Z95828 Presence of other vascular implants and grafts: Secondary | ICD-10-CM

## 2022-05-15 DIAGNOSIS — R809 Proteinuria, unspecified: Secondary | ICD-10-CM

## 2022-05-15 LAB — CBC WITH DIFFERENTIAL/PLATELET
Abs Immature Granulocytes: 0.24 10*3/uL — ABNORMAL HIGH (ref 0.00–0.07)
Basophils Absolute: 0.1 10*3/uL (ref 0.0–0.1)
Basophils Relative: 1 %
Eosinophils Absolute: 0.1 10*3/uL (ref 0.0–0.5)
Eosinophils Relative: 0 %
HCT: 35.8 % — ABNORMAL LOW (ref 39.0–52.0)
Hemoglobin: 11.8 g/dL — ABNORMAL LOW (ref 13.0–17.0)
Immature Granulocytes: 2 %
Lymphocytes Relative: 6 %
Lymphs Abs: 0.8 10*3/uL (ref 0.7–4.0)
MCH: 28.8 pg (ref 26.0–34.0)
MCHC: 33 g/dL (ref 30.0–36.0)
MCV: 87.3 fL (ref 80.0–100.0)
Monocytes Absolute: 1.1 10*3/uL — ABNORMAL HIGH (ref 0.1–1.0)
Monocytes Relative: 9 %
Neutro Abs: 11 10*3/uL — ABNORMAL HIGH (ref 1.7–7.7)
Neutrophils Relative %: 82 %
Platelets: 307 10*3/uL (ref 150–400)
RBC: 4.1 MIL/uL — ABNORMAL LOW (ref 4.22–5.81)
RDW: 13.4 % (ref 11.5–15.5)
WBC: 13.3 10*3/uL — ABNORMAL HIGH (ref 4.0–10.5)
nRBC: 0 % (ref 0.0–0.2)

## 2022-05-15 LAB — URINALYSIS, DIPSTICK ONLY
Bilirubin Urine: NEGATIVE
Glucose, UA: NEGATIVE mg/dL
Hgb urine dipstick: NEGATIVE
Ketones, ur: NEGATIVE mg/dL
Leukocytes,Ua: NEGATIVE
Nitrite: NEGATIVE
Protein, ur: >=300 mg/dL — AB
Specific Gravity, Urine: 1.019 (ref 1.005–1.030)
pH: 6 (ref 5.0–8.0)

## 2022-05-15 LAB — COMPREHENSIVE METABOLIC PANEL
ALT: 29 U/L (ref 0–44)
AST: 50 U/L — ABNORMAL HIGH (ref 15–41)
Albumin: 3.2 g/dL — ABNORMAL LOW (ref 3.5–5.0)
Alkaline Phosphatase: 308 U/L — ABNORMAL HIGH (ref 38–126)
Anion gap: 11 (ref 5–15)
BUN: 27 mg/dL — ABNORMAL HIGH (ref 8–23)
CO2: 25 mmol/L (ref 22–32)
Calcium: 9.6 mg/dL (ref 8.9–10.3)
Chloride: 95 mmol/L — ABNORMAL LOW (ref 98–111)
Creatinine, Ser: 1.06 mg/dL (ref 0.61–1.24)
GFR, Estimated: >60 mL/min (ref >60)
Glucose, Bld: 197 mg/dL — ABNORMAL HIGH (ref 70–99)
Potassium: 5.3 mmol/L — ABNORMAL HIGH (ref 3.5–5.1)
Sodium: 131 mmol/L — ABNORMAL LOW (ref 135–145)
Total Bilirubin: 0.5 mg/dL (ref 0.3–1.2)
Total Protein: 7.6 g/dL (ref 6.5–8.1)

## 2022-05-15 LAB — HEPATITIS B SURFACE ANTIGEN: Hepatitis B Surface Ag: NONREACTIVE

## 2022-05-15 LAB — HEPATITIS B SURFACE ANTIBODY,QUALITATIVE: Hep B S Ab: NONREACTIVE

## 2022-05-15 LAB — HEPATITIS C ANTIBODY: HCV Ab: NONREACTIVE

## 2022-05-15 LAB — TSH
TSH: 4.036 u[IU]/mL (ref 0.350–4.500)
TSH: 4.26 u[IU]/mL (ref 0.350–4.500)

## 2022-05-15 LAB — MAGNESIUM: Magnesium: 1.7 mg/dL (ref 1.7–2.4)

## 2022-05-15 LAB — HEPATITIS B CORE ANTIBODY, TOTAL: Hep B Core Total Ab: NONREACTIVE

## 2022-05-15 MED ORDER — SODIUM CHLORIDE 0.9 % IV SOLN
1200.0000 mg | Freq: Once | INTRAVENOUS | Status: AC
  Administered 2022-05-15: 1200 mg via INTRAVENOUS
  Filled 2022-05-15: qty 20

## 2022-05-15 MED ORDER — ONDANSETRON HCL 8 MG PO TABS: 8.0000 mg | ORAL_TABLET | Freq: Three times a day (TID) | ORAL | 2 refills | Status: AC | PRN

## 2022-05-15 MED ORDER — SODIUM CHLORIDE 0.9 % IV SOLN
15.0000 mg/kg | Freq: Once | INTRAVENOUS | Status: AC
  Administered 2022-05-15: 1200 mg via INTRAVENOUS
  Filled 2022-05-15: qty 48

## 2022-05-15 MED ORDER — SODIUM CHLORIDE 0.9 % IV SOLN: Freq: Once | INTRAVENOUS | Status: AC

## 2022-05-15 MED ORDER — SODIUM CHLORIDE 0.9 % IV SOLN: 15.0000 mg/kg | Freq: Once | INTRAVENOUS | Status: DC

## 2022-05-15 NOTE — Progress Notes (Signed)
Ok to treat with Urine protein 300 and PA pending - PA team on phone now with insurance.  T.O. Dr Rhys Martini, PharmD

## 2022-05-15 NOTE — Progress Notes (Signed)
Pharmacist Chemotherapy Monitoring - Initial Assessment    Anticipated start date: 05/15/22   The following has been reviewed per standard work regarding the patient's treatment regimen: The patient's diagnosis, treatment plan and drug doses, and organ/hematologic function Lab orders and baseline tests specific to treatment regimen  The treatment plan start date, drug sequencing, and pre-medications Prior authorization status  Patient's documented medication list, including drug-drug interaction screen and prescriptions for anti-emetics and supportive care specific to the treatment regimen The drug concentrations, fluid compatibility, administration routes, and timing of the medications to be used The patient's access for treatment and lifetime cumulative dose history, if applicable  The patient's medication allergies and previous infusion related reactions, if applicable   Changes made to treatment plan:  treatment plan date and switch to insurance preferred biosimilar  Follow up needed:  Pending authorization for treatment    Wynona Neat, [facility], 05/15/2022  10:49 AM

## 2022-05-15 NOTE — Progress Notes (Signed)
PA for Morphine Sulfate ER 30 mg submitted to [employer] via CoverMyMeds.  PA approved from 05/11/22-05/11/23.

## 2022-05-16 ENCOUNTER — Telehealth (HOSPITAL_COMMUNITY): Payer: Self-pay

## 2022-05-16 LAB — T4: T4, Total: 9.1 ug/dL (ref 4.5–12.0)

## 2022-05-16 NOTE — Telephone Encounter (Signed)
24 hour follow up call- not able to leave message due to mailbox being full.

## 2022-05-17 ENCOUNTER — Other Ambulatory Visit (HOSPITAL_COMMUNITY): Payer: Self-pay

## 2022-05-17 DIAGNOSIS — R479 Unspecified speech disturbances: Secondary | ICD-10-CM

## 2022-05-17 DIAGNOSIS — C220 Liver cell carcinoma: Secondary | ICD-10-CM

## 2022-05-17 DIAGNOSIS — R1310 Dysphagia, unspecified: Secondary | ICD-10-CM

## 2022-05-17 NOTE — Progress Notes (Signed)
Patient's wife called to report that the patient is experiencing difficulty speaking and swallowing as well as getting choked easily. Dr. Delton Coombes made aware. Order received for swallow study. Order placed. Schedule pending. Patient's wife aware and agreeable.

## 2022-05-18 ENCOUNTER — Ambulatory Visit: Payer: No Typology Code available for payment source | Admitting: General Surgery

## 2022-05-18 ENCOUNTER — Encounter: Payer: Self-pay | Admitting: General Surgery

## 2022-05-18 VITALS — BP 145/80 | HR 92 | Temp 97.5°F | Resp 14 | Ht 68.0 in | Wt 167.0 lb

## 2022-05-18 DIAGNOSIS — C220 Liver cell carcinoma: Secondary | ICD-10-CM

## 2022-05-18 NOTE — H&P (Addendum)
Bryce Adams; [id number]; September 23, 1951   HPI Patient is a 71 year old white male who was referred to my care by Dr. Delton Coombes of oncology for Port-A-Cath placement.  He is undergoing chemotherapy for hepatocellular carcinoma.  He is on Plavix. Past Medical History:  Diagnosis Date   Diabetes mellitus without complication ([place])    Hyperlipemia    Hypertension    Port-A-Cath in place 05/11/2022   Renal disorder    Stroke [facility]    Thyroid disease     Past Surgical History:  Procedure Laterality Date   CARDIAC CATHETERIZATION     CARPAL TUNNEL RELEASE     HAND TENDON SURGERY      Family History  Problem Relation Age of Onset   Colon polyps Mother    Colon cancer Mother    Esophageal cancer Neg Hx    Rectal cancer Neg Hx    Stomach cancer Neg Hx     Current Outpatient Medications on File Prior to Visit  Medication Sig Dispense Refill   amLODipine (NORVASC) 2.5 MG tablet SMARTSIG:1 Tablet(s) By Mouth Every Evening     Atezolizumab (TECENTRIQ IV) Inject into the vein every 21 ( twenty-one) days.     atorvastatin (LIPITOR) 20 MG tablet Take 20 mg by mouth every morning.     Bevacizumab (AVASTIN IV) Inject into the vein every 21 ( twenty-one) days.     clopidogrel (PLAVIX) 75 MG tablet Take 1 tablet (75 mg total) by mouth daily. 30 tablet 11   Continuous Blood Gluc Sensor (DEXCOM G6 SENSOR) MISC Apply topically daily.     hydrochlorothiazide (HYDRODIURIL) 25 MG tablet Take 25 mg by mouth every morning.     HYDROcodone-acetaminophen (NORCO) 10-325 MG tablet Take 1 tablet by mouth every 4 (four) hours as needed. 180 tablet 0   Insulin Pen Needle (B-D UF III MINI PEN NEEDLES) 31G X 5 MM MISC USE EVERY DAY     irbesartan (AVAPRO) 300 MG tablet Take 1 tablet by mouth daily.     Lactulose 20 GM/30ML SOLN Take 30 mLs (20 g total) by mouth every 3 (three) hours as needed. 450 mL 1   levothyroxine (SYNTHROID, LEVOTHROID) 50 MCG tablet Take 50 mcg by mouth every morning.      lidocaine-prilocaine (EMLA) cream Apply a small amount to port a cath site and cover with plastic wrap 1 hour prior to infusion appointments 30 g 3   Magnesium Oxide 400 MG CAPS Take 1 capsule by mouth 2 (two) times daily.     metFORMIN (GLUCOPHAGE) 1000 MG tablet Take 1,000 mg by mouth 2 (two) times daily with a meal.     morphine (MS CONTIN) 30 MG 12 hr tablet Take 1 tablet (30 mg total) by mouth every 12 (twelve) hours. 60 tablet 0   Multiple Vitamin (MULTIVITAMIN WITH MINERALS) TABS Take 1 tablet by mouth daily.     omega-3 acid ethyl esters (LOVAZA) 1 G capsule Take 2 g by mouth 2 (two) times daily.     ondansetron (ZOFRAN) 8 MG tablet Take 1 tablet (8 mg total) by mouth every 8 (eight) hours as needed for nausea or vomiting. 90 tablet 2   ONETOUCH VERIO test strip daily.     prochlorperazine (COMPAZINE) 10 MG tablet Take 1 tablet (10 mg total) by mouth every 6 (six) hours as needed (Nausea or vomiting). 60 tablet 1   sitaGLIPtin (JANUVIA) 100 MG tablet Take 100 mg by mouth daily.     SPS 15 GM/60ML suspension Take by  mouth.     VICTOZA 18 MG/3ML SOPN Inject 1.8 mg into the skin every evening.  4   No current facility-administered medications on file prior to visit.    Allergies  Allergen Reactions   Fentanyl     Difficulty breathing    Social History   Substance and Sexual Activity  Alcohol Use Yes   Alcohol/week: 1.0 standard drink of alcohol   Types: 1 Cans of beer per week   Comment: occasionally     Social History   Tobacco Use  Smoking Status Every Day   Packs/day: 2.00   Types: Cigarettes  Smokeless Tobacco Never    Review of Systems  Constitutional: Negative.   HENT: Negative.    Eyes: Negative.   Respiratory:  Positive for cough and shortness of breath.   Cardiovascular: Negative.   Gastrointestinal:  Positive for abdominal pain and nausea.  Genitourinary:  Positive for frequency.  Musculoskeletal:  Positive for back pain.  Skin: Negative.    Neurological:  Positive for sensory change.  Endo/Heme/Allergies: Negative.   Psychiatric/Behavioral: Negative.      Objective   Vitals:   05/18/22 1141  BP: (!) 145/80  Pulse: 92  Resp: 14  Temp: (!) 97.5 F (36.4 C)  SpO2: 95%    Physical Exam Vitals reviewed.  Constitutional:      Appearance: Normal appearance. He is normal weight. He is not ill-appearing.  HENT:     Head: Normocephalic and atraumatic.  Cardiovascular:     Rate and Rhythm: Normal rate and regular rhythm.     Heart sounds: Normal heart sounds. No murmur heard.    No friction rub. No gallop.  Pulmonary:     Effort: Pulmonary effort is normal. No respiratory distress.     Breath sounds: Normal breath sounds. No stridor. No wheezing, rhonchi or rales.  Skin:    General: Skin is warm and dry.  Neurological:     Mental Status: He is alert and oriented to person, place, and time.    Dr. Tomie China notes reviewed.  Labs reviewed.  INR within normal limits.  Normal platelet count noted. Assessment  Hepatocellular carcinoma, need for central venous access Plan  Patient is scheduled for Port-A-Cath insertion on 05/29/2022.  The risks and benefits of the procedure including bleeding, infection, and pneumothorax were fully explained to the patient, who gave informed consent.  Patient to hold Plavix 1 week before the surgery.

## 2022-05-18 NOTE — Patient Instructions (Signed)
Stop Plavix one week before the procedure.

## 2022-05-18 NOTE — Progress Notes (Signed)
Bryce Adams; [id number]; September 23, 1951   HPI Patient is a 71 year old white male who was referred to my care by Dr. Delton Coombes of oncology for Port-A-Cath placement.  He is undergoing chemotherapy for hepatocellular carcinoma.  He is on Plavix. Past Medical History:  Diagnosis Date   Diabetes mellitus without complication ([place])    Hyperlipemia    Hypertension    Port-A-Cath in place 05/11/2022   Renal disorder    Stroke [facility]    Thyroid disease     Past Surgical History:  Procedure Laterality Date   CARDIAC CATHETERIZATION     CARPAL TUNNEL RELEASE     HAND TENDON SURGERY      Family History  Problem Relation Age of Onset   Colon polyps Mother    Colon cancer Mother    Esophageal cancer Neg Hx    Rectal cancer Neg Hx    Stomach cancer Neg Hx     Current Outpatient Medications on File Prior to Visit  Medication Sig Dispense Refill   amLODipine (NORVASC) 2.5 MG tablet SMARTSIG:1 Tablet(s) By Mouth Every Evening     Atezolizumab (TECENTRIQ IV) Inject into the vein every 21 ( twenty-one) days.     atorvastatin (LIPITOR) 20 MG tablet Take 20 mg by mouth every morning.     Bevacizumab (AVASTIN IV) Inject into the vein every 21 ( twenty-one) days.     clopidogrel (PLAVIX) 75 MG tablet Take 1 tablet (75 mg total) by mouth daily. 30 tablet 11   Continuous Blood Gluc Sensor (DEXCOM G6 SENSOR) MISC Apply topically daily.     hydrochlorothiazide (HYDRODIURIL) 25 MG tablet Take 25 mg by mouth every morning.     HYDROcodone-acetaminophen (NORCO) 10-325 MG tablet Take 1 tablet by mouth every 4 (four) hours as needed. 180 tablet 0   Insulin Pen Needle (B-D UF III MINI PEN NEEDLES) 31G X 5 MM MISC USE EVERY DAY     irbesartan (AVAPRO) 300 MG tablet Take 1 tablet by mouth daily.     Lactulose 20 GM/30ML SOLN Take 30 mLs (20 g total) by mouth every 3 (three) hours as needed. 450 mL 1   levothyroxine (SYNTHROID, LEVOTHROID) 50 MCG tablet Take 50 mcg by mouth every morning.      lidocaine-prilocaine (EMLA) cream Apply a small amount to port a cath site and cover with plastic wrap 1 hour prior to infusion appointments 30 g 3   Magnesium Oxide 400 MG CAPS Take 1 capsule by mouth 2 (two) times daily.     metFORMIN (GLUCOPHAGE) 1000 MG tablet Take 1,000 mg by mouth 2 (two) times daily with a meal.     morphine (MS CONTIN) 30 MG 12 hr tablet Take 1 tablet (30 mg total) by mouth every 12 (twelve) hours. 60 tablet 0   Multiple Vitamin (MULTIVITAMIN WITH MINERALS) TABS Take 1 tablet by mouth daily.     omega-3 acid ethyl esters (LOVAZA) 1 G capsule Take 2 g by mouth 2 (two) times daily.     ondansetron (ZOFRAN) 8 MG tablet Take 1 tablet (8 mg total) by mouth every 8 (eight) hours as needed for nausea or vomiting. 90 tablet 2   ONETOUCH VERIO test strip daily.     prochlorperazine (COMPAZINE) 10 MG tablet Take 1 tablet (10 mg total) by mouth every 6 (six) hours as needed (Nausea or vomiting). 60 tablet 1   sitaGLIPtin (JANUVIA) 100 MG tablet Take 100 mg by mouth daily.     SPS 15 GM/60ML suspension Take by  mouth.     VICTOZA 18 MG/3ML SOPN Inject 1.8 mg into the skin every evening.  4   No current facility-administered medications on file prior to visit.    Allergies  Allergen Reactions   Fentanyl     Difficulty breathing    Social History   Substance and Sexual Activity  Alcohol Use Yes   Alcohol/week: 1.0 standard drink of alcohol   Types: 1 Cans of beer per week   Comment: occasionally     Social History   Tobacco Use  Smoking Status Every Day   Packs/day: 2.00   Types: Cigarettes  Smokeless Tobacco Never    Review of Systems  Constitutional: Negative.   HENT: Negative.    Eyes: Negative.   Respiratory:  Positive for cough and shortness of breath.   Cardiovascular: Negative.   Gastrointestinal:  Positive for abdominal pain and nausea.  Genitourinary:  Positive for frequency.  Musculoskeletal:  Positive for back pain.  Skin: Negative.    Neurological:  Positive for sensory change.  Endo/Heme/Allergies: Negative.   Psychiatric/Behavioral: Negative.      Objective   Vitals:   05/18/22 1141  BP: (!) 145/80  Pulse: 92  Resp: 14  Temp: (!) 97.5 F (36.4 C)  SpO2: 95%    Physical Exam Vitals reviewed.  Constitutional:      Appearance: Normal appearance. He is normal weight. He is not ill-appearing.  HENT:     Head: Normocephalic and atraumatic.  Cardiovascular:     Rate and Rhythm: Normal rate and regular rhythm.     Heart sounds: Normal heart sounds. No murmur heard.    No friction rub. No gallop.  Pulmonary:     Effort: Pulmonary effort is normal. No respiratory distress.     Breath sounds: Normal breath sounds. No stridor. No wheezing, rhonchi or rales.  Skin:    General: Skin is warm and dry.  Neurological:     Mental Status: He is alert and oriented to person, place, and time.    Dr. Tomie China notes reviewed.  Labs reviewed.  INR within normal limits.  Normal platelet count noted. Assessment  Hepatocellular carcinoma, need for central venous access Plan  Patient is scheduled for Port-A-Cath insertion on 05/29/2022.  The risks and benefits of the procedure including bleeding, infection, and pneumothorax were fully explained to the patient, who gave informed consent.  Patient to hold Plavix 1 week before the surgery.

## 2022-05-25 ENCOUNTER — Encounter (HOSPITAL_COMMUNITY)
Admission: RE | Admit: 2022-05-25 | Discharge: 2022-05-25 | Disposition: A | Discharge status: Home / Self Care | Payer: No Typology Code available for payment source | Source: Ambulatory Visit | Attending: General Surgery | Admitting: General Surgery

## 2022-05-25 ENCOUNTER — Encounter (HOSPITAL_COMMUNITY): Payer: Self-pay

## 2022-05-29 ENCOUNTER — Other Ambulatory Visit: Payer: Self-pay

## 2022-05-29 ENCOUNTER — Encounter (HOSPITAL_COMMUNITY): Payer: Self-pay | Admitting: General Surgery

## 2022-05-29 ENCOUNTER — Encounter (HOSPITAL_COMMUNITY)
Admission: RE | Disposition: A | Discharge status: Home / Self Care | Payer: Self-pay | Source: Home / Self Care | Attending: General Surgery

## 2022-05-29 ENCOUNTER — Ambulatory Visit (HOSPITAL_BASED_OUTPATIENT_CLINIC_OR_DEPARTMENT_OTHER): Payer: No Typology Code available for payment source | Admitting: Anesthesiology

## 2022-05-29 ENCOUNTER — Ambulatory Visit (HOSPITAL_COMMUNITY): Payer: No Typology Code available for payment source | Admitting: Anesthesiology

## 2022-05-29 ENCOUNTER — Ambulatory Visit (HOSPITAL_COMMUNITY): Payer: No Typology Code available for payment source

## 2022-05-29 ENCOUNTER — Ambulatory Visit (HOSPITAL_COMMUNITY)
Admission: RE | Admit: 2022-05-29 | Discharge: 2022-05-29 | Disposition: A | Discharge status: Home / Self Care | Payer: No Typology Code available for payment source | Attending: General Surgery | Admitting: General Surgery

## 2022-05-29 DIAGNOSIS — F17210 Nicotine dependence, cigarettes, uncomplicated: Secondary | ICD-10-CM

## 2022-05-29 DIAGNOSIS — C220 Liver cell carcinoma: Secondary | ICD-10-CM | POA: Diagnosis not present

## 2022-05-29 DIAGNOSIS — Z79899 Other long term (current) drug therapy: Secondary | ICD-10-CM | POA: Diagnosis not present

## 2022-05-29 DIAGNOSIS — Z794 Long term (current) use of insulin: Secondary | ICD-10-CM | POA: Diagnosis not present

## 2022-05-29 DIAGNOSIS — E119 Type 2 diabetes mellitus without complications: Secondary | ICD-10-CM | POA: Diagnosis not present

## 2022-05-29 DIAGNOSIS — Z7902 Long term (current) use of antithrombotics/antiplatelets: Secondary | ICD-10-CM | POA: Diagnosis not present

## 2022-05-29 DIAGNOSIS — I10 Essential (primary) hypertension: Secondary | ICD-10-CM | POA: Diagnosis not present

## 2022-05-29 DIAGNOSIS — Z452 Encounter for adjustment and management of vascular access device: Secondary | ICD-10-CM | POA: Diagnosis not present

## 2022-05-29 DIAGNOSIS — I6930 Unspecified sequelae of cerebral infarction: Secondary | ICD-10-CM | POA: Diagnosis not present

## 2022-05-29 DIAGNOSIS — M1990 Unspecified osteoarthritis, unspecified site: Secondary | ICD-10-CM | POA: Insufficient documentation

## 2022-05-29 DIAGNOSIS — E039 Hypothyroidism, unspecified: Secondary | ICD-10-CM | POA: Diagnosis not present

## 2022-05-29 DIAGNOSIS — Z79890 Hormone replacement therapy (postmenopausal): Secondary | ICD-10-CM | POA: Diagnosis not present

## 2022-05-29 DIAGNOSIS — Z7984 Long term (current) use of oral hypoglycemic drugs: Secondary | ICD-10-CM | POA: Diagnosis not present

## 2022-05-29 DIAGNOSIS — I4891 Unspecified atrial fibrillation: Secondary | ICD-10-CM | POA: Diagnosis not present

## 2022-05-29 DIAGNOSIS — Z95828 Presence of other vascular implants and grafts: Secondary | ICD-10-CM

## 2022-05-29 HISTORY — PX: PORTACATH PLACEMENT: SHX2246

## 2022-05-29 LAB — GLUCOSE, CAPILLARY: Glucose-Capillary: 114 mg/dL — ABNORMAL HIGH (ref 70–99)

## 2022-05-29 SURGERY — INSERTION, TUNNELED CENTRAL VENOUS DEVICE, WITH PORT
Anesthesia: General | Site: Chest | Laterality: Left

## 2022-05-29 MED ORDER — LACTATED RINGERS IV SOLN: INTRAVENOUS | Status: DC

## 2022-05-29 MED ORDER — PROPOFOL 500 MG/50ML IV EMUL
INTRAVENOUS | Status: DC | PRN
  Administered 2022-05-29: 30 ug/kg/min via INTRAVENOUS

## 2022-05-29 MED ORDER — LIDOCAINE HCL (PF) 1 % IJ SOLN
INTRAMUSCULAR | Status: AC
  Filled 2022-05-29: qty 30

## 2022-05-29 MED ORDER — CEFAZOLIN SODIUM-DEXTROSE 2-4 GM/100ML-% IV SOLN
2.0000 g | INTRAVENOUS | Status: AC
  Administered 2022-05-29: 2 g via INTRAVENOUS
  Filled 2022-05-29: qty 100

## 2022-05-29 MED ORDER — KETOROLAC TROMETHAMINE 30 MG/ML IJ SOLN
15.0000 mg | Freq: Once | INTRAMUSCULAR | Status: AC
  Administered 2022-05-29: 15 mg via INTRAVENOUS
  Filled 2022-05-29: qty 1

## 2022-05-29 MED ORDER — PHENYLEPHRINE 80 MCG/ML (10ML) SYRINGE FOR IV PUSH (FOR BLOOD PRESSURE SUPPORT)
PREFILLED_SYRINGE | INTRAVENOUS | Status: AC
  Filled 2022-05-29: qty 10

## 2022-05-29 MED ORDER — DEXAMETHASONE SODIUM PHOSPHATE 10 MG/ML IJ SOLN
INTRAMUSCULAR | Status: AC
  Filled 2022-05-29: qty 1

## 2022-05-29 MED ORDER — CHLORHEXIDINE GLUCONATE 0.12 % MT SOLN
15.0000 mL | Freq: Once | OROMUCOSAL | Status: AC
  Administered 2022-05-29: 15 mL via OROMUCOSAL
  Filled 2022-05-29: qty 15

## 2022-05-29 MED ORDER — HYDROMORPHONE HCL 1 MG/ML IJ SOLN: 0.2500 mg | INTRAMUSCULAR | Status: DC | PRN

## 2022-05-29 MED ORDER — ORAL CARE MOUTH RINSE: 15.0000 mL | Freq: Once | OROMUCOSAL | Status: AC

## 2022-05-29 MED ORDER — LIDOCAINE HCL (PF) 1 % IJ SOLN
INTRAMUSCULAR | Status: DC | PRN
  Administered 2022-05-29: 8 mL

## 2022-05-29 MED ORDER — ROCURONIUM BROMIDE 10 MG/ML (PF) SYRINGE
PREFILLED_SYRINGE | INTRAVENOUS | Status: AC
  Filled 2022-05-29: qty 10

## 2022-05-29 MED ORDER — METOCLOPRAMIDE HCL 5 MG/ML IJ SOLN: INTRAMUSCULAR | Status: DC | PRN

## 2022-05-29 MED ORDER — MIDAZOLAM HCL 5 MG/5ML IJ SOLN
INTRAMUSCULAR | Status: DC | PRN
  Administered 2022-05-29: 2 mg via INTRAVENOUS

## 2022-05-29 MED ORDER — CHLORHEXIDINE GLUCONATE CLOTH 2 % EX PADS: 6.0000 | MEDICATED_PAD | Freq: Once | CUTANEOUS | Status: DC

## 2022-05-29 MED ORDER — METOCLOPRAMIDE HCL 5 MG/ML IJ SOLN
INTRAMUSCULAR | Status: DC | PRN
  Administered 2022-05-29: 10 mg via INTRAMUSCULAR

## 2022-05-29 MED ORDER — ONDANSETRON HCL 4 MG/2ML IJ SOLN
INTRAMUSCULAR | Status: AC
  Filled 2022-05-29: qty 2

## 2022-05-29 MED ORDER — FENTANYL CITRATE (PF) 100 MCG/2ML IJ SOLN
INTRAMUSCULAR | Status: AC
  Filled 2022-05-29: qty 2

## 2022-05-29 MED ORDER — ONDANSETRON HCL 4 MG/2ML IJ SOLN: 4.0000 mg | Freq: Once | INTRAMUSCULAR | Status: DC | PRN

## 2022-05-29 MED ORDER — MIDAZOLAM HCL 2 MG/2ML IJ SOLN
INTRAMUSCULAR | Status: AC
  Filled 2022-05-29: qty 2

## 2022-05-29 MED ORDER — PHENYLEPHRINE 80 MCG/ML (10ML) SYRINGE FOR IV PUSH (FOR BLOOD PRESSURE SUPPORT)
PREFILLED_SYRINGE | INTRAVENOUS | Status: AC
  Filled 2022-05-29: qty 20

## 2022-05-29 MED ORDER — METOCLOPRAMIDE HCL 5 MG/ML IJ SOLN
INTRAMUSCULAR | Status: AC
  Filled 2022-05-29: qty 2

## 2022-05-29 MED ORDER — SODIUM CHLORIDE (PF) 0.9 % IJ SOLN
INTRAMUSCULAR | Status: DC | PRN
  Administered 2022-05-29: 500 mL via INTRAVENOUS

## 2022-05-29 MED ORDER — ONDANSETRON HCL 4 MG/2ML IJ SOLN
INTRAMUSCULAR | Status: DC | PRN
  Administered 2022-05-29: 4 mg via INTRAVENOUS

## 2022-05-29 MED ORDER — HEPARIN SOD (PORK) LOCK FLUSH 100 UNIT/ML IV SOLN
INTRAVENOUS | Status: DC | PRN
  Administered 2022-05-29: 500 [IU] via INTRAVENOUS

## 2022-05-29 MED ORDER — HEPARIN SOD (PORK) LOCK FLUSH 100 UNIT/ML IV SOLN
INTRAVENOUS | Status: AC
  Filled 2022-05-29: qty 5

## 2022-05-29 SURGICAL SUPPLY — 30 items
ADH SKN CLS APL DERMABOND .7 (GAUZE/BANDAGES/DRESSINGS) ×1
APL PRP STRL LF ISPRP CHG 10.5 (MISCELLANEOUS) ×1
APPLICATOR CHLORAPREP 10.5 ORG (MISCELLANEOUS) ×2 IMPLANT
BAG DECANTER FOR FLEXI CONT (MISCELLANEOUS) ×2 IMPLANT
CLOTH BEACON ORANGE TIMEOUT ST (SAFETY) ×2 IMPLANT
COVER LIGHT HANDLE STERIS (MISCELLANEOUS) ×4 IMPLANT
DECANTER SPIKE VIAL GLASS SM (MISCELLANEOUS) ×2 IMPLANT
DERMABOND ADVANCED (GAUZE/BANDAGES/DRESSINGS) ×1
DERMABOND ADVANCED .7 DNX12 (GAUZE/BANDAGES/DRESSINGS) ×1 IMPLANT
DRAPE C-ARM FOLDED MOBILE STRL (DRAPES) ×2 IMPLANT
ELECT REM PT RETURN 9FT ADLT (ELECTROSURGICAL) ×2
ELECTRODE REM PT RTRN 9FT ADLT (ELECTROSURGICAL) ×1 IMPLANT
GLOVE BIOGEL PI IND STRL 7.0 (GLOVE) ×2 IMPLANT
GLOVE BIOGEL PI INDICATOR 7.0 (GLOVE) ×2
GLOVE SURG SS PI 7.5 STRL IVOR (GLOVE) ×2 IMPLANT
GOWN STRL REUS W/TWL LRG LVL3 (GOWN DISPOSABLE) ×4 IMPLANT
IV NS 500ML (IV SOLUTION) ×2
IV NS 500ML BAXH (IV SOLUTION) ×1 IMPLANT
KIT PORT POWER 8FR ISP MRI (Port) ×2 IMPLANT
KIT TURNOVER KIT A (KITS) ×2 IMPLANT
NDL HYPO 25X1 1.5 SAFETY (NEEDLE) ×1 IMPLANT
NEEDLE HYPO 25X1 1.5 SAFETY (NEEDLE) ×2 IMPLANT
PACK MINOR (CUSTOM PROCEDURE TRAY) ×2 IMPLANT
PAD ARMBOARD 7.5X6 YLW CONV (MISCELLANEOUS) ×2 IMPLANT
SET BASIN LINEN APH (SET/KITS/TRAYS/PACK) ×2 IMPLANT
SUT MNCRL AB 4-0 PS2 18 (SUTURE) ×2 IMPLANT
SUT VIC AB 3-0 SH 27 (SUTURE) ×2
SUT VIC AB 3-0 SH 27X BRD (SUTURE) ×1 IMPLANT
SYR 5ML LL (SYRINGE) ×2 IMPLANT
SYR CONTROL 10ML LL (SYRINGE) ×2 IMPLANT

## 2022-05-29 NOTE — Interval H&P Note (Signed)
History and Physical Interval Note:  05/29/2022 8:24 AM  Bryce Adams  has presented today for surgery, with the diagnosis of HEPTOCELLULAR CARCINOMA.  The various methods of treatment have been discussed with the patient and family. After consideration of risks, benefits and other options for treatment, the patient has consented to  Procedure(s): INSERTION PORT-A-CATH (Left) as a surgical intervention.  The patient's history has been reviewed, patient examined, no change in status, stable for surgery.  I have reviewed the patient's chart and labs.  Questions were answered to the patient's satisfaction.     Aviva Signs

## 2022-05-29 NOTE — Transfer of Care (Signed)
Immediate Anesthesia Transfer of Care Note  Patient: Bryce Adams  Procedure(s) Performed: INSERTION PORT-A-CATH (Left: Chest)  Patient Location: PACU  Anesthesia Type:MAC  Level of Consciousness: awake, alert  and oriented  Airway & Oxygen Therapy: Patient Spontanous Breathing and Patient connected to nasal cannula oxygen  Post-op Assessment: Report given to RN and Post -op Vital signs reviewed and stable  Post vital signs: Reviewed and stable  Last Vitals:  Vitals Value Taken Time  BP 126/71 05/29/22 0943  Temp    Pulse 80 05/29/22 0944  Resp 15 05/29/22 0944  SpO2 97 % 05/29/22 0944  Vitals shown include unvalidated device data.  Last Pain:  Vitals:   05/29/22 0836  TempSrc: Oral  PainSc: 5       Patients Stated Pain Goal: 5 (43/60/67 7034)  Complications: No notable events documented.

## 2022-05-29 NOTE — Anesthesia Preprocedure Evaluation (Addendum)
Anesthesia Evaluation  Patient identified by MRN, date of birth, ID band Patient awake    Reviewed: Allergy & Precautions, NPO status , Patient's Chart, lab work & pertinent test results  Airway Mallampati: III  TM Distance: >3 FB Neck ROM: Full    Dental  (+) Dental Advisory Given, Teeth Intact   Pulmonary shortness of breath, with exertion and lying, Current SmokerPatient did not abstain from smoking.,    Pulmonary exam normal breath sounds clear to auscultation       Cardiovascular Exercise Tolerance: Poor hypertension, Pt. on medications Normal cardiovascular exam+ dysrhythmias Atrial Fibrillation  Rhythm:Regular Rate:Normal     Neuro/Psych CVA, Residual Symptoms negative psych ROS   GI/Hepatic negative GI ROS, (+) Cirrhosis  (hepatocellular carcinoma)      ,   Endo/Other  diabetes, Well Controlled, Type 2, Oral Hypoglycemic AgentsHypothyroidism   Renal/GU Renal InsufficiencyRenal disease  negative genitourinary   Musculoskeletal  (+) Arthritis , Osteoarthritis,    Abdominal   Peds negative pediatric ROS (+)  Hematology  (+) Blood dyscrasia, anemia ,   Anesthesia Other Findings   Reproductive/Obstetrics negative OB ROS                            Anesthesia Physical Anesthesia Plan  ASA: 4  Anesthesia Plan: General   Post-op Pain Management: Minimal or no pain anticipated   Induction: Intravenous  PONV Risk Score and Plan: Propofol infusion, Ondansetron, Metaclopromide and Dexamethasone  Airway Management Planned: Nasal Cannula and Natural Airway  Additional Equipment:   Intra-op Plan:   Post-operative Plan:   Informed Consent: I have reviewed the patients History and Physical, chart, labs and discussed the procedure including the risks, benefits and alternatives for the proposed anesthesia with the patient or authorized representative who has indicated his/her  understanding and acceptance.     Dental advisory given  Plan Discussed with: CRNA and Surgeon  Anesthesia Plan Comments:        Anesthesia Quick Evaluation

## 2022-05-29 NOTE — Op Note (Signed)
Patient:  Bryce Adams  DOB:  [birth year]  MRN:  [id number]   Preop Diagnosis: Hepatocellular carcinoma, need for central venous access  Postop Diagnosis: Same  Procedure: Port-A-Cath insertion  Surgeon: Aviva Signs, MD  Anes: MAC  Indications: Patient is a 71 year old white male who presents for Port-A-Cath insertion.  He is about to undergo chemotherapy for hepatocellular carcinoma.  The risks and benefits of the procedure including bleeding, infection, and pneumothorax were fully explained to the patient, who gave informed consent.  Procedure note: The patient was placed in the Trendelenburg position after the left upper chest was prepped and draped using usual sterile technique with ChloraPrep.  Surgical site confirmation was performed.  1% Xylocaine was used for local anesthesia.  An incision was made below the left clavicle.  A subcutaneous pocket was formed.  A needle was advanced into the left subclavian vein using the Seldinger technique without difficulty.  The guidewire was then advanced into the right atrium under fluoroscopic guidance.  An introducer and peel-away sheath were placed over the guidewire.  The catheter was inserted through the peel-away sheath and the peel-away sheath was removed.  The catheter was then attached to the port and the port placed in subcutaneous pocket.  Adequate positioning was confirmed by fluoroscopy.  Good backflow of venous blood was noted on aspiration of the port.  The port was flushed with heparin flush.  The subcutaneous layer was reapproximated using a 3-0 Vicryl interrupted suture.  The skin was closed using a 4-0 Monocryl subcuticular suture.  Dermabond was applied.  All tape and needle counts were correct at the end of the procedure.  The patient was awakened and transferred to PACU in stable condition.  A chest x-ray will be performed at that time.  Complications: None  EBL: Minimal  Specimen: None

## 2022-05-29 NOTE — Anesthesia Postprocedure Evaluation (Signed)
Anesthesia Post Note  Patient: Bryce Adams  Procedure(s) Performed: INSERTION PORT-A-CATH (Left: Chest)  Patient location during evaluation: Phase II Anesthesia Type: General Level of consciousness: awake and alert and oriented Pain management: pain level controlled Vital Signs Assessment: post-procedure vital signs reviewed and stable Respiratory status: spontaneous breathing, nonlabored ventilation and respiratory function stable Cardiovascular status: blood pressure returned to baseline and stable Postop Assessment: no apparent nausea or vomiting Anesthetic complications: no   No notable events documented.   Last Vitals:  Vitals:   05/29/22 1015 05/29/22 1031  BP: 139/76 132/72  Pulse:  79  Resp: 15 14  Temp:  36.6 C  SpO2: 97% 100%    Last Pain:  Vitals:   05/29/22 1031  TempSrc: Axillary  PainSc: 0-No pain                 Oran Dillenburg C Brayden Betters

## 2022-05-31 ENCOUNTER — Other Ambulatory Visit (HOSPITAL_COMMUNITY): Payer: Self-pay | Admitting: Specialist

## 2022-05-31 ENCOUNTER — Other Ambulatory Visit (HOSPITAL_COMMUNITY): Payer: Self-pay

## 2022-05-31 ENCOUNTER — Encounter (HOSPITAL_COMMUNITY): Payer: Self-pay | Admitting: General Surgery

## 2022-05-31 DIAGNOSIS — Z79899 Other long term (current) drug therapy: Secondary | ICD-10-CM | POA: Diagnosis not present

## 2022-05-31 DIAGNOSIS — Z7985 Long-term (current) use of injectable non-insulin antidiabetic drugs: Secondary | ICD-10-CM | POA: Diagnosis not present

## 2022-05-31 DIAGNOSIS — K5900 Constipation, unspecified: Secondary | ICD-10-CM | POA: Insufficient documentation

## 2022-05-31 DIAGNOSIS — Z79890 Hormone replacement therapy (postmenopausal): Secondary | ICD-10-CM | POA: Diagnosis not present

## 2022-05-31 DIAGNOSIS — R21 Rash and other nonspecific skin eruption: Secondary | ICD-10-CM | POA: Insufficient documentation

## 2022-05-31 DIAGNOSIS — Z794 Long term (current) use of insulin: Secondary | ICD-10-CM | POA: Diagnosis not present

## 2022-05-31 DIAGNOSIS — R0989 Other specified symptoms and signs involving the circulatory and respiratory systems: Secondary | ICD-10-CM

## 2022-05-31 DIAGNOSIS — Z7984 Long term (current) use of oral hypoglycemic drugs: Secondary | ICD-10-CM | POA: Diagnosis not present

## 2022-05-31 DIAGNOSIS — Z5112 Encounter for antineoplastic immunotherapy: Secondary | ICD-10-CM | POA: Diagnosis present

## 2022-05-31 DIAGNOSIS — R809 Proteinuria, unspecified: Secondary | ICD-10-CM | POA: Diagnosis not present

## 2022-05-31 DIAGNOSIS — I10 Essential (primary) hypertension: Secondary | ICD-10-CM | POA: Diagnosis not present

## 2022-05-31 DIAGNOSIS — E785 Hyperlipidemia, unspecified: Secondary | ICD-10-CM | POA: Diagnosis not present

## 2022-05-31 DIAGNOSIS — Z8673 Personal history of transient ischemic attack (TIA), and cerebral infarction without residual deficits: Secondary | ICD-10-CM | POA: Insufficient documentation

## 2022-05-31 DIAGNOSIS — F17210 Nicotine dependence, cigarettes, uncomplicated: Secondary | ICD-10-CM | POA: Insufficient documentation

## 2022-05-31 DIAGNOSIS — C220 Liver cell carcinoma: Secondary | ICD-10-CM | POA: Diagnosis not present

## 2022-05-31 DIAGNOSIS — Z800 Family history of malignant neoplasm of digestive organs: Secondary | ICD-10-CM | POA: Insufficient documentation

## 2022-05-31 DIAGNOSIS — Z7902 Long term (current) use of antithrombotics/antiplatelets: Secondary | ICD-10-CM | POA: Diagnosis not present

## 2022-05-31 DIAGNOSIS — E119 Type 2 diabetes mellitus without complications: Secondary | ICD-10-CM | POA: Insufficient documentation

## 2022-05-31 DIAGNOSIS — R1312 Dysphagia, oropharyngeal phase: Secondary | ICD-10-CM

## 2022-05-31 DIAGNOSIS — E079 Disorder of thyroid, unspecified: Secondary | ICD-10-CM | POA: Diagnosis not present

## 2022-05-31 LAB — PROTEIN, URINE, 24 HOUR
Collection Interval-UPROT: 24 hours
Protein, 24H Urine: 1703 mg/d — ABNORMAL HIGH (ref 50–100)
Protein, Urine: 131 mg/dL
Urine Total Volume-UPROT: 1300 mL

## 2022-05-31 NOTE — Progress Notes (Addendum)
05/31/2022 Bryce Adams [id number] Jul 19, 1951   Chief Complaint: Difficulty swallowing   History of Present Illness: Bryce Adams is a 71 year old male with a past medical history of hypertension, hyperlipidemia, hypothyroidism, diabetes mellitus type 2, CKD, CVA 09/12/2021 on Plavix, colon polyps and recently diagnosed with hepatocellular cancer s/p port-A -cath placement 05/29/2022 for palliative immunotherapy.   I last saw Bryce Adams in office on 11/25/2021 due to having dysphagia and diarrhea. At that time, he described having having intermittent oral phase and esophageal dysphagia for several years prior to his acute CVA 08/2021.  Due to his recent CVA 08/2021 on Plavix, endoscopic evaluation was deferred until at least 11/2021.  A barium swallow study with tablet was ordered but was not completed.  A GI pathogen panel was ordered to evaluate his diarrhea which was not completed.   He developed RUQ pain with associated fatigue and a 20 pound weight loss over the past 6 months.  He was evaluated by Dr. Maryellen Pile and laboratory studies showed elevated alk phos and AST levels.  He underwent RUQ sonogram 03/20/2022 which showed a 4.8 cm mass to the right hepatic lobe.  An abdominal MRI 04/06/2022 confirmed a large irregular poorly defined mass to the right hepatic lobe measuring 11 x 12 x 8.5 cm concerning for HCC.  A PET scan 04/20/2022 identified the right liver lesion as hepatocellular carcinoma with hypermetabolic tumor extension to the right portal vein with nodal metastasis to the porta hepatis and mediastinotomy him, supraclavicular nodes and metastatic lesions at T2 and L5.  A liver biopsy 05/02/2022 confirmed poorly differentiated carcinoma.  B and C serologies were negative.  AFP 77,000 906.  He is followed by oncologist Dr. Ellin Saba. Started on Atezolizumab and Bevacizumab 05/15/2020 every 21 days.  No history of esophageal varices, however, due to the increased risk of bleeding from varices with  Bevacizumab Dr. Candise Che requested an EGD.  Continues to have a combination of oral phase as well as esophageal dysphagia with liquids and solid foods.  He describes feeling a choking sensation when he swallows liquids and solid foods.  He is awaking at nighttime with a choking sensation which triggers a panic attack for the past 2 months.  He feels pressure in his throat which radiates up into his ears.  He feels like his ears are plugged.  He sometimes raises his head upward which facilitate swallowing.  He is scheduled for swallow study with speech pathology on 06/13/2022  He smokes 2 packs of cigarettes daily for the past 50 years, he is recently reduced to 1-1/2 pack/day.    He has intermittent right chest wall pain and right upper back pain which is controlled with hydrocodone as needed.  No left chest pain.  No history of coronary artery disease.   He has some constipation which has improved on Colace.  He takes lactulose as needed.  No rectal bleeding or black stools.      Latest Ref Rng & Units 05/15/2022    9:02 AM 05/02/2022    7:42 AM 04/13/2022    9:27 AM  CBC  WBC 4.0 - 10.5 K/uL 13.3  12.7  15.5   Hemoglobin 13.0 - 17.0 g/dL 45.1  17.2  50.4   Hematocrit 39.0 - 52.0 % 35.8  34.7  39.7   Platelets 150 - 400 K/uL 307  249  395    MCV 87.3     Latest Ref Rng & Units 05/15/2022    9:02 AM 05/02/2022  7:42 AM 04/13/2022    9:27 AM  CMP  Glucose 70 - 99 mg/dL 197  149  163   BUN 8 - 23 mg/dL $Remove'27  31  27   'sGWYisW$ Creatinine 0.61 - 1.24 mg/dL 1.06  1.04  1.22   Sodium 135 - 145 mmol/L 131  130  130   Potassium 3.5 - 5.1 mmol/L 5.3  5.2  5.5   Chloride 98 - 111 mmol/L 95  97  97   CO2 22 - 32 mmol/L $RemoveB'25  23  24   'LmvWaHAb$ Calcium 8.9 - 10.3 mg/dL 9.6  9.3  9.4   Total Protein 6.5 - 8.1 g/dL 7.6  7.3  8.1   Total Bilirubin 0.3 - 1.2 mg/dL 0.5  0.7  0.6   Alkaline Phos 38 - 126 U/L 308  255  295   AST 15 - 41 U/L 50  47  47   ALT 0 - 44 U/L 29  30  36    Hep C antibody nonreactive   Hep B surface  antigen nonreactive   Hep B Core total antibody nonreactive   RUQ sono 03/20/2022: There is a 4.8 cm hypoechoic mass within the right hepatic lobe. This needs dedicated evaluation with pre and post contrast-enhanced abdominal MRI for definitive characterization.  Increased hepatic parenchymal echogenicity suggestive of steatosis.  Abdominal MRI 04/06/2022: 1. Large irregular and poorly defined mass centered at the dome of the right hepatic lobe which is consistent with malignancy, possibly hepatocellular carcinoma, cholangiocarcinoma, or metastasis. 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 No definite mass visualized in the left lobe. 2. Associated tumor thrombus involving the right portal vein and its branches. 3. Lymphadenopathy centered in the portacaval region which is likely metastatic. 4. At least 2 enhancing bone lesions visualized, largest at L5, highly suspicious for metastasis. 5. Splenomegaly. 6. Other ancillary findings as described.  PET scan 04/20/2022: 1. Hypermetabolic mass within the RIGHT hepatic lobe is most suggestive of primary hepatic carcinoma (hepatocellular carcinoma). 2. Evidence of hypermetabolic tumor extension into the RIGHT portal vein. 3. 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 4. Thoracic hypermetabolic nodal metastasis to the mediastinum. 5. Small hypermetabolic supraclavicular nodes also consistent with nodal metastasis. 6. Hypermetabolic lesions at T2 and L5 concerning for skeletal metastasis.  Liver biopsy 05/02/2022: A.   LIVER MASS, BIOPSY:  -    Poorly differentiated carcinoma, see Comment.  The tumor was interrogated with immunohistochemical (IHC) stains.  The  tumor immunoprofile is most consistent with poorly differentiated  hepatocellular carcinoma ([place]).  However, a component of  cholangiocarcinoma cannot be entirely excluded.   ECHO 10/14/2021: Left ventricular ejection fraction, by estimation,  is 60 to 65%. The left ventricle has normal function. The left ventricle has no regional wall motion abnormalities. There is mild left ventricular hypertrophy. Left ventricular diastolic parameters are consistent with Grade I diastolic dysfunction (impaired relaxation). 1. Right ventricular systolic function is normal. The right ventricular size is normal. Tricuspid regurgitation signal is inadequate for assessing PA pressure. 2. The mitral valve is abnormal. Mild mitral valve regurgitation. Moderate mitral annular calcification. 3. The aortic valve is tricuspid. There is moderate calcification of the aortic valve. Aortic valve regurgitation is not visualized. Mild aortic valve stenosis. Aortic valve area, by VTI measures 1.79 cm. Aortic valve mean gradient measures 9.0 mmHg. 4. The inferior vena cava is normal in size with greater than 50% respiratory variability, suggesting right atrial pressure of 3 mmHg.  PAST GI PROCEDURES:  Colonoscopy 10/17/2019 by Dr. Havery Moros: - One 4 mm polyp in the ascending colon, removed with a cold snare. Resected and retrieved. - Two 3 to 4 mm polyps in the transverse colon, removed with a cold snare. Resected and retrieved. retrieved. - Three 3 to 4 mm polyps at the recto-sigmoid colon, removed with a cold snare. Resected and retrieved. - Diverticulosis in the sigmoid colon. - Internal hemorrhoids. - The examination was otherwise normal -Recall colonoscopy 3 years. Mother with history of colon cancer.  Surgical [P], colon, transverse, ascending and rectosigmoid, polyp (6) - TUBULAR ADENOMA(S). - NO HIGH GRADE DYSPLASIA OR MALIGNANCY. - HYPERPLASTIC POLYP(S).   ECHO 10/14/2021: Left ventricular ejection fraction, by estimation, is 60 to 65%. The left ventricle has normal function. The left ventricle has no regional wall motion abnormalities. There is mild left ventricular hypertrophy. Left ventricular diastolic parameters are  consistent with Grade I diastolic dysfunction (impaired relaxation). 1. Right ventricular systolic function is normal. The right ventricular size is normal. Tricuspid regurgitation signal is inadequate for assessing PA pressure. 2. The mitral valve is abnormal. Mild mitral valve regurgitation. Moderate mitral annular calcification. 3. The aortic valve is tricuspid. There is moderate calcification of the aortic valve. Aortic valve regurgitation is not visualized. Mild aortic valve stenosis. Aortic valve area, by VTI measures 1.79 cm. Aortic valve mean gradient measures 9.0 mmHg. 4. The inferior vena cava is normal in size with greater than 50% respiratory variability, suggesting right atrial pressure of 3 mmHg.  Current Medications, Allergies, Past Medical History, Past Surgical History, Family History and Social History were reviewed in [employer] record.   Review of Systems:   Constitutional: Negative for fever, sweats, chills or weight loss.  Respiratory: Negative for shortness of breath.   Cardiovascular: Negative for chest pain, palpitations and leg swelling.  Gastrointestinal: See HPI.  Musculoskeletal: Negative for back pain or muscle aches.  Neurological: Negative for dizziness, headaches or paresthesias.    Physical Exam: General: 71 year old male fatigued appearing in no acute distress. Head: Normocephalic and atraumatic. Eyes: No scleral icterus. Conjunctiva pink . Ears: Normal auditory acuity. Mouth: Dentition intact. No ulcers or lesions.  Lungs: Clear throughout to auscultation. Heart: Regular rate and rhythm, no murmur. Abdomen: Soft, nontender.  Mildly distended. No masses or hepatomegaly. Normal bowel sounds x 4 quadrants.  Rectal: Deferred.  Musculoskeletal: Symmetrical with no gross deformities. Extremities: No edema. Neurological: Alert oriented x 4. No focal deficits.  Psychological: Alert and cooperative. Normal mood and  affect Skin: Numerous tattoos.  No jaundice.  Assessment and Recommendations:  37) 71 year old male with oral phase and dysphagia with liquids and solid food which started several years prior to his CVA. -Proceed with swallow study with speech pathologist 06/13/2022 as scheduled -EGD benefits and risks discussed including risk with sedation, risk of bleeding, perforation and infection  -Our office will contact Dr. Brynda Greathouse to verify Plavix hold instructions prior to EGD. ADDENDUM: See Dr. Doyne Keel addendum, patient will remain on Plavix for a diagnostic EGD at [facility].  -Famotidine 20 mg once daily -Avoid eating large pieces of bread/meat.  Cut food in small pieces and chew food thoroughly.  2) Metastatic poorly differentiated hepatocellular carcinoma with portacaval lymphadenopathy and bone lesions at T2 and L5. On palliative atezolizumab and Bevacizumab given once every 21 days. No history of esophageal varices, however, due to increased risk of bleeding from varices with Bevacizumab oncology recommended EGD. -EGD as ordered above   3) Normocytic anemia. No overt GI bleeding.  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  Mother with history of colon cancer. -Defer plans for colon polyp surveillance colonoscopy in the setting of [place] with metastasis.  Await further recommendations per Dr. Havery Moros.  Further recommendations to be determined after EGD completed  ADDENDUM: Requested case review by Osvaldo Angst CRNA. EGD to be scheduled at [facility] secondary to patient's multiple comorbidities and recent pre-op classification as an ASA IV necessitate his procedure to be done at the hospital.

## 2022-06-01 ENCOUNTER — Ambulatory Visit (INDEPENDENT_AMBULATORY_CARE_PROVIDER_SITE_OTHER): Payer: No Typology Code available for payment source | Admitting: Nurse Practitioner

## 2022-06-01 ENCOUNTER — Telehealth: Payer: Self-pay | Admitting: *Deleted

## 2022-06-01 ENCOUNTER — Encounter: Payer: Self-pay | Admitting: Nurse Practitioner

## 2022-06-01 VITALS — BP 110/60 | HR 92 | Ht 67.5 in | Wt 168.5 lb

## 2022-06-01 DIAGNOSIS — C220 Liver cell carcinoma: Secondary | ICD-10-CM

## 2022-06-01 DIAGNOSIS — R1310 Dysphagia, unspecified: Secondary | ICD-10-CM

## 2022-06-01 NOTE — Patient Instructions (Addendum)
1) Take Famotidine '20mg'$  one tab by mouth once daily  2) You will be contacted by our office prior to your procedure for directions on holding your Plavix.  If you do not hear from our office 1 week prior to your scheduled procedure, please call [phone] to discuss.   3) You have been scheduled for an endoscopy. Please follow written instructions given to you at your visit today. If you use inhalers (even only as needed), please bring them with you on the day of your procedure.  If you are age 58 or older, your body mass index should be between 23-30. Your Body mass index is 26 kg/m. If this is out of the aforementioned range listed, please consider follow up with your Primary Care Provider.  If you are age 18 or younger, your body mass index should be between 19-25. Your Body mass index is 26 kg/m. If this is out of the aformentioned range listed, please consider follow up with your Primary Care Provider.   ________________________________________________________  The [facility] providers would like to encourage you to use [facility] to communicate with providers for non-urgent requests or questions.  Due to long hold times on the telephone, sending your provider a message by [facility] may be a faster and more efficient way to get a response.  Please allow 48 business hours for a response.  Please remember that this is for non-urgent requests.  _______________________________________________________

## 2022-06-01 NOTE — Progress Notes (Signed)
Note abnormal value

## 2022-06-02 ENCOUNTER — Other Ambulatory Visit (HOSPITAL_COMMUNITY): Payer: Self-pay

## 2022-06-02 ENCOUNTER — Telehealth: Payer: Self-pay | Admitting: *Deleted

## 2022-06-02 DIAGNOSIS — R809 Proteinuria, unspecified: Secondary | ICD-10-CM

## 2022-06-02 DIAGNOSIS — C220 Liver cell carcinoma: Secondary | ICD-10-CM

## 2022-06-02 NOTE — Telephone Encounter (Signed)
Called and spoke with patient's wife. She is aware that we are cancelling 8/4 [facility] appt. Pt's wife is aware that we are discussing possible hospital dates with Dr. Havery Moros and will call them to discuss further once we have more information. Pt's wife verbalized understanding and had no concerns at the end of the call.

## 2022-06-02 NOTE — Progress Notes (Signed)
Agree with assessment with the following thoughts. Difficult situation. I think okay to do a diagnostic EGD to screen for varices on Plavix given his CVA last year, although unclear on imaging to date if he has cirrhosis. I would not plan on dilating him if he is actively taking bevocizumab given risks for bleeding with dilation, in regards to his dysphagia, so hopefully no high risk lesions there. He does not meet criteria to warrant anesthesia in the hospital setting for an exam based on review of his record at this time, but will discuss his case with anesthesia to make sure he is cleared to have this done in the [facility]. Agree with speech path evaluation otherwise for his dysphagia to ensure no component of his problems there as well.

## 2022-06-02 NOTE — Telephone Encounter (Signed)
Dr. Havery Moros,  This pt's multiple co-morbidities and recent pre-op classification as an ASA IV necessitate his procedure to be done at the hospital.  Thanks,  Bryce Adams

## 2022-06-02 NOTE — Telephone Encounter (Signed)
Dr. Havery Moros, can you coordinate with [facility] what date you are able to do Bryce Adams EGD at [facility]? Patient was wanting to have EGD  done as soon as possible. If you are unable to do EGD soon, do you want any other physician who has an earlier availability to do it?   [place], please cancel the EGD that was tentatively scheduled at [facility]. I discussed with the patient and his wife at the time of his office visit that his EGD may need to be done at the hospital and that I was going to consult with Dr. Havery Moros regarding this issue. So he will not be surprised when you inform him that his procedure needs to be done at the hospital.

## 2022-06-02 NOTE — Telephone Encounter (Signed)
Difficult situation, sorry to hear this. Unfortunately hospital slots are extremely hard to come by right now and I already have a wait list for 3 months.  Herbert Seta do know if there are any openings to have this done in August?  If so we can add him there.  Otherwise, Jaclyn Shaggy I think it would be a good idea to do a barium swallow to ensure no high risk or concerning lesions causing his dysphagia.  We will also await his modified barium swallow.  [place] can you please order barium swallow.  Recall, I think okay to do a diagnostic EGD to screen for varices on Plavix given his CVA last year, although unclear on imaging to date if he has cirrhosis. I would not plan on dilating him if he is actively taking bevocizumab given risks for bleeding with dilation, in regards to his dysphagia, so hopefully no high risk lesions there.

## 2022-06-05 ENCOUNTER — Inpatient Hospital Stay (HOSPITAL_COMMUNITY): Payer: No Typology Code available for payment source | Admitting: Hematology

## 2022-06-05 ENCOUNTER — Inpatient Hospital Stay (HOSPITAL_COMMUNITY): Payer: No Typology Code available for payment source | Attending: Hematology

## 2022-06-05 ENCOUNTER — Inpatient Hospital Stay (HOSPITAL_COMMUNITY): Payer: No Typology Code available for payment source

## 2022-06-05 VITALS — BP 140/77 | HR 83 | Temp 96.9°F | Resp 18

## 2022-06-05 DIAGNOSIS — Z95828 Presence of other vascular implants and grafts: Secondary | ICD-10-CM

## 2022-06-05 DIAGNOSIS — C220 Liver cell carcinoma: Secondary | ICD-10-CM

## 2022-06-05 DIAGNOSIS — K769 Liver disease, unspecified: Secondary | ICD-10-CM

## 2022-06-05 DIAGNOSIS — Z5112 Encounter for antineoplastic immunotherapy: Secondary | ICD-10-CM | POA: Diagnosis not present

## 2022-06-05 DIAGNOSIS — R160 Hepatomegaly, not elsewhere classified: Secondary | ICD-10-CM | POA: Diagnosis not present

## 2022-06-05 LAB — CBC WITH DIFFERENTIAL/PLATELET
Abs Immature Granulocytes: 0.24 10*3/uL — ABNORMAL HIGH (ref 0.00–0.07)
Basophils Absolute: 0.1 10*3/uL (ref 0.0–0.1)
Basophils Relative: 1 %
Eosinophils Absolute: 0.1 10*3/uL (ref 0.0–0.5)
Eosinophils Relative: 1 %
HCT: 33.7 % — ABNORMAL LOW (ref 39.0–52.0)
Hemoglobin: 10.8 g/dL — ABNORMAL LOW (ref 13.0–17.0)
Immature Granulocytes: 2 %
Lymphocytes Relative: 9 %
Lymphs Abs: 1 10*3/uL (ref 0.7–4.0)
MCH: 27.6 pg (ref 26.0–34.0)
MCHC: 32 g/dL (ref 30.0–36.0)
MCV: 86 fL (ref 80.0–100.0)
Monocytes Absolute: 1.3 10*3/uL — ABNORMAL HIGH (ref 0.1–1.0)
Monocytes Relative: 12 %
Neutro Abs: 7.7 10*3/uL (ref 1.7–7.7)
Neutrophils Relative %: 75 %
Platelets: 221 10*3/uL (ref 150–400)
RBC: 3.92 MIL/uL — ABNORMAL LOW (ref 4.22–5.81)
RDW: 15 % (ref 11.5–15.5)
WBC: 10.3 10*3/uL (ref 4.0–10.5)
nRBC: 0 % (ref 0.0–0.2)

## 2022-06-05 LAB — COMPREHENSIVE METABOLIC PANEL
ALT: 42 U/L (ref 0–44)
AST: 57 U/L — ABNORMAL HIGH (ref 15–41)
Albumin: 2.8 g/dL — ABNORMAL LOW (ref 3.5–5.0)
Alkaline Phosphatase: 470 U/L — ABNORMAL HIGH (ref 38–126)
Anion gap: 8 (ref 5–15)
BUN: 35 mg/dL — ABNORMAL HIGH (ref 8–23)
CO2: 26 mmol/L (ref 22–32)
Calcium: 8.8 mg/dL — ABNORMAL LOW (ref 8.9–10.3)
Chloride: 97 mmol/L — ABNORMAL LOW (ref 98–111)
Creatinine, Ser: 0.91 mg/dL (ref 0.61–1.24)
GFR, Estimated: >60 mL/min (ref >60)
Glucose, Bld: 152 mg/dL — ABNORMAL HIGH (ref 70–99)
Potassium: 4.8 mmol/L (ref 3.5–5.1)
Sodium: 131 mmol/L — ABNORMAL LOW (ref 135–145)
Total Bilirubin: 1 mg/dL (ref 0.3–1.2)
Total Protein: 6.8 g/dL (ref 6.5–8.1)

## 2022-06-05 LAB — MAGNESIUM: Magnesium: 2 mg/dL (ref 1.7–2.4)

## 2022-06-05 MED ORDER — SODIUM CHLORIDE 0.9% FLUSH
10.0000 mL | INTRAVENOUS | Status: DC | PRN
  Administered 2022-06-05: 10 mL

## 2022-06-05 MED ORDER — HEPARIN SOD (PORK) LOCK FLUSH 100 UNIT/ML IV SOLN
500.0000 [IU] | Freq: Once | INTRAVENOUS | Status: AC | PRN
  Administered 2022-06-05: 500 [IU]

## 2022-06-05 MED ORDER — FUROSEMIDE 20 MG PO TABS: 20.0000 mg | ORAL_TABLET | Freq: Every day | ORAL | 3 refills | Status: AC | PRN

## 2022-06-05 MED ORDER — SODIUM CHLORIDE 0.9 % IV SOLN
1200.0000 mg | Freq: Once | INTRAVENOUS | Status: AC
  Administered 2022-06-05: 1200 mg via INTRAVENOUS
  Filled 2022-06-05: qty 20

## 2022-06-05 MED ORDER — SODIUM CHLORIDE 0.9 % IV SOLN: Freq: Once | INTRAVENOUS | Status: AC

## 2022-06-05 NOTE — Telephone Encounter (Signed)
Lm on vm for patient's wife to return call.  Patient is scheduled for MBSS on 06/13/22. There is also already an order in epic for barium swallow test as well.

## 2022-06-05 NOTE — Progress Notes (Signed)
Patients port flushed without difficulty.  Good blood return noted with no bruising or swelling noted at site.  Stable during access and blood draw.  Patient to remain accessed for treatment. 

## 2022-06-05 NOTE — Telephone Encounter (Signed)
Pt's wife returned call. I have reviewed Dr. Doyne Keel recommendations. I advised Stanton Kidney that pt should keep MBSS as scheduled for 06/13/22. I informed her that a barium swallow study was ordered by [facility] in 10/2021 and we will have radiology scheduling reach out to them to set up the patient's appt at [facility], per their request. I told her we would need these results before we schedule EGD. She is aware that patient will be able to continue Plavix for his EGD. She is aware that we will contact them as hospital appts become available. Mary verbalized understanding and had no concerns at the end of the call.   Secure staff message sent to radiology scheduling to contact pt to set up barium swallow appt at [facility].

## 2022-06-05 NOTE — Progress Notes (Signed)
[place], [place] [address]   CLINIC:  Medical Oncology/Hematology  PCP:  Eber Hong, MD [id number] / [place] [address] [phone]   REASON FOR VISIT:  Follow-up for right hepatic lobe mass  PRIOR THERAPY: none  NGS Results: not done  CURRENT THERAPY: Atezolizumab + Bevacizumab q21d Maintenance  BRIEF ONCOLOGIC HISTORY:  Oncology History  Hepatocellular carcinoma ([place])  05/08/2022 Initial Diagnosis   Hepatocellular carcinoma ([place])   05/15/2022 -  Chemotherapy   Patient is on Treatment Plan : LIVER Atezolizumab + Bevacizumab q21d Maintenance       CANCER STAGING:  Cancer Staging  Hepatocellular carcinoma ([place]) Staging form: Liver, AJCC 8th Edition - Clinical stage from 05/08/2022: Stage IVB (cT4, cN1, pM1) - Unsigned   INTERVAL HISTORY:  [name], a 71 y.o. male, returns for routine follow-up and consideration for next cycle of chemotherapy. Bryce Adams was last seen on 05/08/2022.  Due for cycle #2 of Atezolizumab + Bevacizumab today.   Overall, he tells me he has been feeling pretty well. He reports pain in his groin with urination and BM. His wife reports swelling in his legs starting on 7/7. His appetite is good. His wife reports "his ears stay clogged up" intermittently over the past 2 weeks accompanied by hoarseness; he denies sore throat. He denies diarrhea and bleeding. He reports rash above his knees bilaterally. His wife reports increased anxiety due to new joint pains in his left hip. He also reports pain in his right side and in his back.    Overall, he feels ready for next cycle of chemo today.    REVIEW OF SYSTEMS:  Review of Systems  Constitutional:  Negative for appetite change and fatigue.  HENT:   Positive for hearing loss (clogged ears), trouble swallowing and voice change (hoarse). Negative for nosebleeds and sore throat.   Respiratory:  Positive for cough and shortness of breath. Negative for  hemoptysis.   Cardiovascular:  Positive for leg swelling.  Gastrointestinal:  Negative for blood in stool and diarrhea.  Genitourinary:  Positive for nocturia and pelvic pain (groin). Negative for hematuria.   Musculoskeletal:  Positive for arthralgias (L hip) and back pain (6/10 R side).  Skin:  Positive for rash.  Hematological:  Does not bruise/bleed easily.  Psychiatric/Behavioral:  Positive for depression and sleep disturbance. The patient is nervous/anxious.   All other systems reviewed and are negative.   PAST MEDICAL/SURGICAL HISTORY:  Past Medical History:  Diagnosis Date   Diabetes mellitus without complication ([place])    Hyperlipemia    Hypertension    Kidney disease    Liver cancer ([place])    Port-A-Cath in place 05/11/2022   Renal disorder    Stroke [facility])    Thyroid disease    Past Surgical History:  Procedure Laterality Date   CARDIAC CATHETERIZATION     CARPAL TUNNEL RELEASE     HAND TENDON SURGERY     PORTACATH PLACEMENT Left 05/29/2022   Procedure: INSERTION PORT-A-CATH;  Surgeon: Aviva Signs, MD;  Location: [facility];  Service: General;  Laterality: Left;    SOCIAL HISTORY:  Social History   Socioeconomic History   Marital status: Married    Spouse name: Not on file   Number of children: 3   Years of education: Not on file   Highest education level: Not on file  Occupational History   Occupation: retired  Tobacco Use   Smoking status: Every Day    Packs/day:  2.00    Types: Cigarettes   Smokeless tobacco: Never  Vaping Use   Vaping Use: Never used  Substance and Sexual Activity   Alcohol use: Yes    Alcohol/week: 1.0 standard drink of alcohol    Types: 1 Cans of beer per week    Comment: occasionally    Drug use: No   Sexual activity: Not on file  Other Topics Concern   Not on file  Social History Narrative   Not on file   Social Determinants of Health   Financial Resource Strain: Not on file  Food Insecurity: Not on file  Transportation  Needs: Not on file  Physical Activity: Not on file  Stress: Not on file  Social Connections: Not on file  Intimate Partner Violence: Not on file    FAMILY HISTORY:  Family History  Problem Relation Age of Onset   Colon polyps Mother    Colon cancer Mother    Heart disease Father    Diabetes Father    Heart disease Paternal Grandmother    Heart disease Paternal Grandfather    Emphysema Paternal Grandfather    Diabetes Daughter    Esophageal cancer Neg Hx    Rectal cancer Neg Hx    Stomach cancer Neg Hx     CURRENT MEDICATIONS:  Current Outpatient Medications  Medication Sig Dispense Refill   amLODipine (NORVASC) 2.5 MG tablet Take 2.5 mg by mouth every evening.     Atezolizumab (TECENTRIQ IV) Inject into the vein every 21 ( twenty-one) days.     atorvastatin (LIPITOR) 20 MG tablet Take 20 mg by mouth every morning.     Bevacizumab (AVASTIN IV) Inject into the vein every 21 ( twenty-one) days.     calcium carbonate (OSCAL) 1500 (600 Ca) MG TABS tablet Take 600 mg of elemental calcium by mouth daily.     clopidogrel (PLAVIX) 75 MG tablet Take 1 tablet (75 mg total) by mouth daily. 30 tablet 11   Continuous Blood Gluc Sensor (DEXCOM G6 SENSOR) MISC Apply topically daily.     hydrochlorothiazide (HYDRODIURIL) 25 MG tablet Take 25 mg by mouth every morning.     HYDROcodone-acetaminophen (NORCO) 10-325 MG tablet Take 1 tablet by mouth every 4 (four) hours as needed. 180 tablet 0   Insulin Pen Needle (B-D UF III MINI PEN NEEDLES) 31G X 5 MM MISC USE EVERY DAY     irbesartan (AVAPRO) 300 MG tablet Take 300 mg by mouth daily.     Lactulose 20 GM/30ML SOLN Take 30 mLs (20 g total) by mouth every 3 (three) hours as needed. 450 mL 1   levothyroxine (SYNTHROID, LEVOTHROID) 50 MCG tablet Take 50 mcg by mouth every morning.     lidocaine-prilocaine (EMLA) cream Apply a small amount to port a cath site and cover with plastic wrap 1 hour prior to infusion appointments (Patient not taking:  Reported on 06/01/2022) 30 g 3   Magnesium 250 MG TABS Take 250 mg by mouth in the morning and at bedtime.     metFORMIN (GLUCOPHAGE) 1000 MG tablet Take 2,000 mg by mouth 2 (two) times daily with a meal.     morphine (MS CONTIN) 30 MG 12 hr tablet Take 1 tablet (30 mg total) by mouth every 12 (twelve) hours. 60 tablet 0   Multiple Vitamins-Minerals (CENTRUM SILVER 50+MEN) TABS Take 1 tablet by mouth daily.     Omega-3 Fatty Acids (FISH OIL) 1200 MG CAPS Take 1,200 mg by mouth in the morning  and at bedtime.     ondansetron (ZOFRAN) 8 MG tablet Take 1 tablet (8 mg total) by mouth every 8 (eight) hours as needed for nausea or vomiting. 90 tablet 2   ONETOUCH VERIO test strip daily.     sitaGLIPtin (JANUVIA) 100 MG tablet Take 100 mg by mouth daily.     VICTOZA 18 MG/3ML SOPN Inject 1.8 mg into the skin every evening.  4   No current facility-administered medications for this visit.    ALLERGIES:  Allergies  Allergen Reactions   Fentanyl Shortness Of Breath   Compazine [Prochlorperazine]     Hallucinations, vivid dreams    PHYSICAL EXAM:  Performance status (ECOG): 1 - Symptomatic but completely ambulatory  There were no vitals filed for this visit. Wt Readings from Last 3 Encounters:  06/01/22 168 lb 8 oz (76.4 kg)  05/29/22 167 lb (75.8 kg)  05/18/22 167 lb (75.8 kg)   Physical Exam Vitals reviewed.  Constitutional:      Appearance: Normal appearance.  Cardiovascular:     Rate and Rhythm: Normal rate and regular rhythm.     Pulses: Normal pulses.     Heart sounds: Normal heart sounds.  Pulmonary:     Effort: Pulmonary effort is normal.     Breath sounds: Normal breath sounds.  Neurological:     General: No focal deficit present.     Mental Status: He is alert and oriented to person, place, and time.  Psychiatric:        Mood and Affect: Mood normal.        Behavior: Behavior normal.     LABORATORY DATA:  I have reviewed the labs as listed.     Latest Ref Rng & Units  05/15/2022    9:02 AM 05/02/2022    7:42 AM 04/13/2022    9:27 AM  CBC  WBC 4.0 - 10.5 K/uL 13.3  12.7  15.5   Hemoglobin 13.0 - 17.0 g/dL 11.8  11.7  13.3   Hematocrit 39.0 - 52.0 % 35.8  34.7  39.7   Platelets 150 - 400 K/uL 307  249  395       Latest Ref Rng & Units 05/15/2022    9:02 AM 05/02/2022    7:42 AM 04/13/2022    9:27 AM  CMP  Glucose 70 - 99 mg/dL 197  149  163   BUN 8 - 23 mg/dL _0 Creatinine 0.61 - 1.24 mg/dL 1.06  1.04  1.22   Sodium 135 - 145 mmol/L 131  130  130   Potassium 3.5 - 5.1 mmol/L 5.3  5.2  5.5   Chloride 98 - 111 mmol/L 95  97  97   CO2 22 - 32 mmol/L _1 Calcium 8.9 - 10.3 mg/dL 9.6  9.3  9.4   Total Protein 6.5 - 8.1 g/dL 7.6  7.3  8.1   Total Bilirubin 0.3 - 1.2 mg/dL 0.5  0.7  0.6   Alkaline Phos 38 - 126 U/L 308  255  295   AST 15 - 41 U/L 50  47  47   ALT 0 - 44 U/L 29  30  36     DIAGNOSTIC IMAGING:  I have independently reviewed the scans and discussed with the patient. DG C-Arm 1-60 Min-No Report  Result Date: 05/29/2022 Fluoroscopy was utilized by the requesting physician.  No radiographic interpretation.   DG Chest [facility] [address]  Result Date: 05/29/2022 CLINICAL DATA:  Port-A-Cath placement EXAM: PORTABLE CHEST 1 VIEW COMPARISON:  Radiograph 02/13/2013 FINDINGS: Chest port catheter tip overlies the mid superior vena cava. Unchanged cardiomediastinal silhouette. There is no focal airspace consolidation. Right basilar atelectasis. No pleural effusion. No pneumothorax. No acute osseous abnormality. IMPRESSION: Chest port catheter tip overlies the mid superior vena cava. No pneumothorax. Right basilar atelectasis. Electronically Signed   By: Maurine Simmering M.D.   On: 05/29/2022 10:04     ASSESSMENT:   Right hepatic lobe mass, portacaval lymphadenopathy and bone lesions: - Weight loss: 20 pounds in 6 months, decreased appetite - Reports helped going downhill since he had COVID in September 2022, 1 month later had stroke. - Elevated  alkaline phosphatase noted by Dr. Brynda Greathouse - Ultrasound abdomen (03/20/2022): Right hepatic mass - MRI abdomen (04/06/2022): 11 x 12 x 8.5 cm large irregular poorly defined mass at the dome of the right hepatic lobe with numerous satellite tumors in the right hepatic lobe.  Associated tumor thrombus involving right portal vein and its branches.  Lymphadenopathy in the portacaval region.  2 enhancing bone lesions at L5 and T11 vertebral body.  Splenomegaly measuring 14 cm. - PET scan (04/21/2022): Hypermetabolic mass in the right hepatic lobe, hypermetabolic tumor extension into the right portal vein, hypermetabolic nodal metastasis to porta hepatis and mediastinum and small supraclavicular nodes.  Hypermetabolic lesions at T2 and L5. - Pathology of liver biopsy: Poorly differentiated HCC.  Tumor is positive for glypican-3 3 and HepPar 1, negative for arginase 1.  Tumor is partially reactive for CK7 and has weak CDX2 reactivity.  Cells are negative for CK20 and MOC 31.  CT 34 shows aberrant sinusoidal reactivity in the tumor which supports diagnosis of [place] Pugh class A (6 points). - Cycle 1 of Atezolizumab and bevacizumab on 05/15/2022    Social/family history: - Lives at home with his wife.  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 - 2 pack/day smoker for last 50 years. - Mother died of colon cancer.   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  He had some rash on the lower thighs which is improving.  He is applying hydrocortisone. - He also reported ears getting stuffed up on and off for the last 2 weeks which is bothering him the most.  No decreased hearing.  No sore throat. - He will see ENT for further evaluation. - He was evaluated by Dr. Havery Moros, in the process of getting EGD scheduled. - We reviewed NGS testing results which showed insufficient tumor for  BRAF and NTRK.  PD-L1 BW389 was negative. - He has port placed. - We reviewed his labs today which showed alk phos 470.  AST is 57 and almond 2.8.  Rest of the LFTs are normal. - Because of significant proteinuria, I will hold Avastin.  He will proceed with Atezolizumab today. - RTC 3 weeks for follow-up.  2.  Right-sided chest wall pain: - Continue MS Contin 30 mg twice daily. - Continue hydrocodone 10/325 every 5 hours as needed.  3.  Constipation: - Continue Colace and lactulose as needed.  4.  Proteinuria: - UA prior to first dose of Avastin showed protein more than 300. - 24-hour urine for total protein on 05/31/2022 showed 1.7 g proteinuria. - Creatinine is normal at 0.91.  He was reportedly followed by nephrology in [place]  for proteinuria.  He is already on Avapro. - We will make a referral to Dr. Theador Hawthorne.  5.  Leg swelling: - He had developed lower extremity swelling in the last 3 to 4 days. - His albumin is low at 2.8.  We will start him on Lasix 20 mg in the mornings as needed.  He will not take HCTZ on the days he takes Lasix.  We will also discontinue amlodipine.   Orders placed this encounter:  No orders of the defined types were placed in this encounter.    Derek Jack, MD [place] [phone]   I, Thana Ates, am acting as a scribe for Dr. Derek Jack.  I, Derek Jack MD, have reviewed the above documentation for accuracy and completeness, and I agree with the above.

## 2022-06-05 NOTE — Telephone Encounter (Signed)
Per Dr. Havery Moros patient can have EGD on Plavix.

## 2022-06-05 NOTE — Patient Instructions (Addendum)
[place] at [facility] Discharge Instructions   You were seen and examined today by Dr. Delton Coombes.  He reviewed the results of your lab work which is normal/stable.   We will send a prescription for a fluid pill called Lasix. Take daily in the morning as needed for swelling. Do not take the hydrochlorothiazide on the days you take Lasix.   We will hold the Avastin today as it can cause increased protein in your urine, and your urine protein is already high. We will refer you to a nephrologist for this.   Stop taking Norvasc (amlodipine).  Return as scheduled in 3 weeks.    Thank you for choosing [place] at [facility] to provide your oncology and hematology care.  To afford each patient quality time with our provider, please arrive at least 15 minutes before your scheduled appointment time.   If you have a lab appointment with the [facility] please come in thru the Main Entrance and check in at the main information desk.  You need to re-schedule your appointment should you arrive 10 or more minutes late.  We strive to give you quality time with our providers, and arriving late affects you and other patients whose appointments are after yours.  Also, if you no show three or more times for appointments you may be dismissed from the clinic at the providers discretion.     Again, thank you for choosing [facility].  Our hope is that these requests will decrease the amount of time that you wait before being seen by our physicians.       _____________________________________________________________  Should you have questions after your visit to [facility], please contact our office at [phone] and follow the prompts.  Our office hours are 8:00 a.m. and 4:30 p.m. Monday - Friday.  Please note that voicemails left after 4:00 p.m. may not be returned until the following business day.  We are closed weekends and  major holidays.  You do have access to a nurse 24-7, just call the main number to the clinic [phone] and do not press any options, hold on the line and a nurse will answer the phone.    For prescription refill requests, have your pharmacy contact our office and allow 72 hours.    Due to Covid, you will need to wear a mask upon entering the hospital. If you do not have a mask, a mask will be given to you at the Main Entrance upon arrival. For doctor visits, patients may have 1 support person age 49 or older with them. For treatment visits, patients can not have anyone with them due to social distancing guidelines and our immunocompromised population.

## 2022-06-05 NOTE — Patient Instructions (Signed)
[facility]  Discharge Instructions: Thank you for choosing [facility] to provide your oncology and hematology care.  If you have a lab appointment with the [facility], please come in thru the Main Entrance and check in at the main information desk.  Wear comfortable clothing and clothing appropriate for easy access to any Portacath or PICC line.   We strive to give you quality time with your provider. You may need to reschedule your appointment if you arrive late (15 or more minutes).  Arriving late affects you and other patients whose appointments are after yours.  Also, if you miss three or more appointments without notifying the office, you may be dismissed from the clinic at the provider's discretion.      For prescription refill requests, have your pharmacy contact our office and allow 72 hours for refills to be completed.    Today you received the following chemotherapy and/or immunotherapy agents tecentriq.  Atezolizumab injection What is this medication? ATEZOLIZUMAB (a te zoe LIZ ue mab) is a monoclonal antibody. It is used to treat bladder cancer (urothelial cancer), liver cancer, lung cancer, and melanoma. This medicine may be used for other purposes; ask your health care provider or pharmacist if you have questions. COMMON BRAND NAME(S): Tecentriq What should I tell my care team before I take this medication? They need to know if you have any of these conditions: autoimmune diseases like Crohn's disease, ulcerative colitis, or lupus have had or planning to have an allogeneic stem cell transplant (uses someone else's stem cells) history of organ transplant history of radiation to the chest nervous system problems like myasthenia gravis or Guillain-Barre syndrome an unusual or allergic reaction to atezolizumab, other medicines, foods, dyes, or preservatives pregnant or trying to get pregnant breast-feeding How should I use this medication? This  medicine is for infusion into a vein. It is given by a health care professional in a hospital or clinic setting. A special MedGuide will be given to you before each treatment. Be sure to read this information carefully each time. Talk to your pediatrician regarding the use of this medicine in children. Special care may be needed. Overdosage: If you think you have taken too much of this medicine contact a poison control center or emergency room at once. NOTE: This medicine is only for you. Do not share this medicine with others. What if I miss a dose? It is important not to miss your dose. Call your doctor or health care professional if you are unable to keep an appointment. What may interact with this medication? Interactions have not been studied. This list may not describe all possible interactions. Give your health care provider a list of all the medicines, herbs, non-prescription drugs, or dietary supplements you use. Also tell them if you smoke, drink alcohol, or use illegal drugs. Some items may interact with your medicine. What should I watch for while using this medication? Your condition will be monitored carefully while you are receiving this medicine. You may need blood work done while you are taking this medicine. Do not become pregnant while taking this medicine or for at least 5 months after stopping it. Women should inform their doctor if they wish to become pregnant or think they might be pregnant. There is a potential for serious side effects to an unborn child. Talk to your health care professional or pharmacist for more information. Do not breast-feed an infant while taking this medicine or for at least 5  months after the last dose. What side effects may I notice from receiving this medication? Side effects that you should report to your doctor or health care professional as soon as possible: allergic reactions like skin rash, itching or hives, swelling of the face, lips, or  tongue black, tarry stools bloody or watery diarrhea breathing problems changes in vision chest pain or chest tightness chills facial flushing fever headache signs and symptoms of high blood sugar such as dizziness; dry mouth; dry skin; fruity breath; nausea; stomach pain; increased hunger or thirst; increased urination signs and symptoms of liver injury like dark yellow or brown urine; general ill feeling or flu-like symptoms; light-colored stools; loss of appetite; nausea; right upper belly pain; unusually weak or tired; yellowing of the eyes or skin stomach pain trouble passing urine or change in the amount of urine Side effects that usually do not require medical attention (report to your doctor or health care professional if they continue or are bothersome): bone pain cough diarrhea joint pain muscle pain muscle weakness swelling of arms or legs tiredness weight loss This list may not describe all possible side effects. Call your doctor for medical advice about side effects. You may report side effects to FDA at 1-800-FDA-1088. Where should I keep my medication? This drug is given in a hospital or clinic and will not be stored at home. NOTE: This sheet is a summary. It may not cover all possible information. If you have questions about this medicine, talk to your doctor, pharmacist, or health care provider.  2023 Elsevier/Gold Standard (2021-10-14 00:00:00)       To help prevent nausea and vomiting after your treatment, we encourage you to take your nausea medication as directed.  BELOW ARE SYMPTOMS THAT SHOULD BE REPORTED IMMEDIATELY: *FEVER GREATER THAN 100.4 F (38 C) OR HIGHER *CHILLS OR SWEATING *NAUSEA AND VOMITING THAT IS NOT CONTROLLED WITH YOUR NAUSEA MEDICATION *UNUSUAL SHORTNESS OF BREATH *UNUSUAL BRUISING OR BLEEDING *URINARY PROBLEMS (pain or burning when urinating, or frequent urination) *BOWEL PROBLEMS (unusual diarrhea, constipation, pain near the  anus) TENDERNESS IN MOUTH AND THROAT WITH OR WITHOUT PRESENCE OF ULCERS (sore throat, sores in mouth, or a toothache) UNUSUAL RASH, SWELLING OR PAIN  UNUSUAL VAGINAL DISCHARGE OR ITCHING   Items with * indicate a potential emergency and should be followed up as soon as possible or go to the Emergency Department if any problems should occur.  Please show the CHEMOTHERAPY ALERT CARD or IMMUNOTHERAPY ALERT CARD at check-in to the Emergency Department and triage nurse.  Should you have questions after your visit or need to cancel or reschedule your appointment, please contact [facility] [phone]  and follow the prompts.  Office hours are 8:00 a.m. to 4:30 p.m. Monday - Friday. Please note that voicemails left after 4:00 p.m. may not be returned until the following business day.  We are closed weekends and major holidays. You have access to a nurse at all times for urgent questions. Please call the main number to the clinic [phone] and follow the prompts.  For any non-urgent questions, you may also contact your provider using MyChart. We now offer e-Visits for anyone 33 and older to request care online for non-urgent symptoms. For details visit [email or web address].   Also download the MyChart app! Go to the app store, search "MyChart", open the app, select , and log in with your MyChart username and password.  Masks are optional in the cancer centers. If you would like for your  care team to wear a mask while they are taking care of you, please let them know. For doctor visits, patients may have with them one support person who is at least 69 years old. At this time, visitors are not allowed in the infusion area.

## 2022-06-05 NOTE — Progress Notes (Signed)
Patient tolerated chemotherapy with no complaints voiced.  Side effects with management reviewed with understanding verbalized.  Port site clean and dry with no bruising or swelling noted at site.  Good blood return noted before and after administration of chemotherapy.  Band aid applied.  Patient left in satisfactory condition with VSS and no s/s of distress noted.   

## 2022-06-12 ENCOUNTER — Other Ambulatory Visit (HOSPITAL_COMMUNITY): Payer: Self-pay

## 2022-06-12 ENCOUNTER — Encounter (HOSPITAL_COMMUNITY): Payer: Self-pay

## 2022-06-12 MED ORDER — MORPHINE SULFATE ER 30 MG PO TBCR: 30.0000 mg | EXTENDED_RELEASE_TABLET | Freq: Two times a day (BID) | ORAL | 0 refills | Status: DC

## 2022-06-12 MED ORDER — MEGESTROL ACETATE 400 MG/10ML PO SUSP: 400.0000 mg | Freq: Two times a day (BID) | ORAL | 4 refills | Status: AC

## 2022-06-12 NOTE — Progress Notes (Signed)
Call received from patient's wife reporting that he is having trouble with lack of appetite. Order placed for Megace '400mg'$  BID per Dr. Delton Coombes. Wife also mentions that several of their family friends have mentioned Palliative Care to them. I discussed palliative care and referral offered to palliative care. Referral placed to [facility] for Palliative Care.

## 2022-06-13 ENCOUNTER — Other Ambulatory Visit (HOSPITAL_COMMUNITY): Payer: No Typology Code available for payment source

## 2022-06-13 ENCOUNTER — Ambulatory Visit (HOSPITAL_COMMUNITY)
Admission: RE | Admit: 2022-06-13 | Discharge: 2022-06-13 | Disposition: A | Discharge status: Home / Self Care | Payer: No Typology Code available for payment source | Source: Ambulatory Visit | Attending: Hematology | Admitting: Hematology

## 2022-06-13 ENCOUNTER — Ambulatory Visit (HOSPITAL_COMMUNITY): Payer: No Typology Code available for payment source | Admitting: Hematology

## 2022-06-13 ENCOUNTER — Encounter (HOSPITAL_COMMUNITY): Payer: Self-pay | Admitting: Speech Pathology

## 2022-06-13 ENCOUNTER — Ambulatory Visit (HOSPITAL_COMMUNITY): Payer: No Typology Code available for payment source | Attending: Hematology | Admitting: Speech Pathology

## 2022-06-13 DIAGNOSIS — R479 Unspecified speech disturbances: Secondary | ICD-10-CM | POA: Insufficient documentation

## 2022-06-13 DIAGNOSIS — C220 Liver cell carcinoma: Secondary | ICD-10-CM | POA: Insufficient documentation

## 2022-06-13 DIAGNOSIS — R1310 Dysphagia, unspecified: Secondary | ICD-10-CM | POA: Insufficient documentation

## 2022-06-13 DIAGNOSIS — R1312 Dysphagia, oropharyngeal phase: Secondary | ICD-10-CM | POA: Diagnosis not present

## 2022-06-13 NOTE — Therapy (Signed)
Bryce Adams [address] [place], [place], [address] Phone: [phone]   Fax:  [phone]  Speech Language Pathology - Outpatient Modified Barium Swallow Study Evaluation  Patient Details  Name: Bryce Adams MRN: [id number] Date of Birth: May 06, 1951 No data recorded  Encounter Date: 06/13/2022   End of Session - 06/13/22 1115     Visit Number 1    Number of Visits 1    SLP Start Time 8841    SLP Stop Time  1028    SLP Time Calculation (min) 20 min    Activity Tolerance Patient tolerated treatment well               There were no vitals filed for this visit.    Past Medical History:  Past Medical History:  Diagnosis Date   Diabetes mellitus without complication ([place])    Hyperlipemia    Hypertension    Kidney disease    Liver cancer ([place])    Port-A-Cath in place 05/11/2022   Renal disorder    Stroke [facility])    Thyroid disease    Past Surgical History:  Past Surgical History:  Procedure Laterality Date   CARDIAC CATHETERIZATION     CARPAL TUNNEL RELEASE     HAND TENDON SURGERY     PORTACATH PLACEMENT Left 05/29/2022   Procedure: INSERTION PORT-A-CATH;  Surgeon: Aviva Signs, MD;  Location: [facility];  Service: General;  Laterality: Left;   HPI: Dayden Viverette is a 71 y.o. male with PMH of DM type II, HLD, HTN, CVA and thyroid disease. 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  Pt has been complaining of getting "strangled" in the night and throat discomfort. Pt denies dysphagia with PO, however he does report some difficulty swallowing pills sometimes. Modified barium swallow test requested.   No data recorded   Recommendations for follow up therapy are one component of a multi-disciplinary discharge planning process, led by the attending physician.  Recommendations may be updated based on patient status, additional functional criteria and insurance  authorization.  Assessment / Plan / Recommendation     06/13/2022   11:00 AM  Clinical Impressions  Clinical Impression Pt presents with oropharyngeal swallowing to be essentially within functional limits. Note adequate oral phase with timely AP transit and swallowing trigger, good laryngeal vestibule closure and airway protection. Slightly diminished pharyngeal squeeze and BOT retraction results in mild valleculae residue with thin liquids, cued throat clear and repeat dry swallow is effective in clearing majority of residue. Barium tablet was consumed with thin liquids without incident. Recommend continue with regular diet and thin liquids. Recommen occasional throat clear and dry swallows throughout meal. Meds are ok whole with liquids. There are no further ST needs noted at this time.  SLP Visit Diagnosis Dysphagia, unspecified (R13.10)  Impact on safety and function Mild aspiration risk         06/13/2022   11:00 AM  Treatment Recommendations  Treatment Recommendations No treatment recommended at this time         No data to display             06/13/2022   11:00 AM  Diet Recommendations  SLP Diet Recommendations Regular solids;Thin liquid  Liquid Administration via Cup;Straw  Medication Administration Whole meds with liquid  Compensations Clear throat intermittently;Multiple dry swallows after each bite/sip         06/13/2022   11:00 AM  Other Recommendations  Oral Care Recommendations Oral care BID  Follow Up Recommendations No SLP follow up        No data to display               06/13/2022   11:00 AM  Oral Phase  Oral Phase [facility]       06/13/2022   11:00 AM  Pharyngeal Phase  Pharyngeal Phase Impaired  Pharyngeal- Thin [place] Pharyngeal residue - valleculae;Reduced tongue base retraction;Reduced pharyngeal peristalsis  Pharyngeal- Thin Cup Pharyngeal residue - valleculae;Pharyngeal residue - pyriform;Reduced pharyngeal peristalsis;Reduced tongue base  retraction  Pharyngeal- Thin Straw Reduced tongue base retraction;Reduced pharyngeal peristalsis;Pharyngeal residue - valleculae;Pharyngeal residue - pyriform  Pharyngeal- Puree WFL  Pharyngeal- Mechanical Soft NT  Pharyngeal- Regular WFL  Pharyngeal- Multi-consistency NT  Pharyngeal- Pill [facility]        06/13/2022   11:00 AM  Cervical Esophageal Phase   Cervical Esophageal Phase Bryce Adams     Bryce Adams 06/13/2022, 11:17 AM                              Patient will benefit from skilled therapeutic intervention in order to improve the following deficits and impairments:   Oropharyngeal dysphagia    Problem List Patient Active Problem List   Diagnosis Date Noted   Port-A-Cath in place 05/11/2022   Hepatocellular carcinoma ([place]) 05/08/2022   Liver masses 04/13/2022   Left-sided weakness 09/20/2021   Cerebrovascular accident (CVA) due to occlusion of cerebral artery ([place]) 09/12/2021   Deviated nasal septum 09/12/2021   Hyperlipidemia, mixed 09/12/2021   Tobacco use disorder 09/12/2021   Anemia, deficiency 09/09/2021   Low magnesium level 09/09/2021   Adenomatous polyp of transverse colon 10/20/2019   Sigmoid diverticulosis 10/20/2019   Stage 3a chronic kidney disease ([place]) 04/18/2019   Pain in right knee 03/27/2019   Family history of colon cancer 01/15/2017   Acquired hypothyroidism 01/01/2015   DMII (diabetes mellitus, type 2) ([place]) 01/01/2015   Encounter for long-term (current) use of medications 06/15/2014   Vitamin D deficiency 12/10/2008   Nonspecific abnormal results of liver function study 12/08/2008   Obesity 12/19/2006   Bundle branch block, left 09/22/2005   Hypertension 01/09/2003   Lateral epicondylitis 07/02/2001    Kynzlie Hilleary H. Roddie Mc, CCC-SLP Speech Language Pathologist   Bryce Adams, [place] 06/13/2022, 11:16 AM  Bryce Adams [address] [place], [place],  [address] Phone: [phone]   Fax:  [phone]  Name: Bryce Adams MRN: [id number] Date of Birth: 07-03-1951

## 2022-06-14 ENCOUNTER — Other Ambulatory Visit: Payer: Self-pay | Admitting: Nephrology

## 2022-06-14 ENCOUNTER — Other Ambulatory Visit (HOSPITAL_COMMUNITY): Payer: Self-pay | Admitting: Nephrology

## 2022-06-14 DIAGNOSIS — E1122 Type 2 diabetes mellitus with diabetic chronic kidney disease: Secondary | ICD-10-CM

## 2022-06-14 DIAGNOSIS — R809 Proteinuria, unspecified: Secondary | ICD-10-CM

## 2022-06-15 ENCOUNTER — Other Ambulatory Visit (HOSPITAL_COMMUNITY)
Admission: RE | Admit: 2022-06-15 | Discharge: 2022-06-15 | Disposition: A | Discharge status: Home / Self Care | Payer: No Typology Code available for payment source | Source: Ambulatory Visit | Attending: Nephrology | Admitting: Nephrology

## 2022-06-15 ENCOUNTER — Encounter (HOSPITAL_COMMUNITY): Payer: Self-pay

## 2022-06-15 ENCOUNTER — Other Ambulatory Visit: Payer: Self-pay

## 2022-06-15 ENCOUNTER — Emergency Department (HOSPITAL_COMMUNITY): Payer: No Typology Code available for payment source

## 2022-06-15 ENCOUNTER — Emergency Department (HOSPITAL_COMMUNITY)
Admission: EM | Admit: 2022-06-15 | Discharge: 2022-06-15 | Disposition: A | Discharge status: Home / Self Care | Payer: No Typology Code available for payment source | Attending: Emergency Medicine | Admitting: Emergency Medicine

## 2022-06-15 DIAGNOSIS — Z794 Long term (current) use of insulin: Secondary | ICD-10-CM | POA: Diagnosis not present

## 2022-06-15 DIAGNOSIS — N189 Chronic kidney disease, unspecified: Secondary | ICD-10-CM | POA: Insufficient documentation

## 2022-06-15 DIAGNOSIS — R0602 Shortness of breath: Secondary | ICD-10-CM | POA: Insufficient documentation

## 2022-06-15 DIAGNOSIS — Z8505 Personal history of malignant neoplasm of liver: Secondary | ICD-10-CM | POA: Insufficient documentation

## 2022-06-15 LAB — URINALYSIS, ROUTINE W REFLEX MICROSCOPIC
Bacteria, UA: NONE SEEN
Bilirubin Urine: NEGATIVE
Glucose, UA: NEGATIVE mg/dL
Hgb urine dipstick: NEGATIVE
Ketones, ur: NEGATIVE mg/dL
Leukocytes,Ua: NEGATIVE
Nitrite: NEGATIVE
Protein, ur: 100 mg/dL — AB
Specific Gravity, Urine: 1.018 (ref 1.005–1.030)
pH: 6 (ref 5.0–8.0)

## 2022-06-15 LAB — CBC WITH DIFFERENTIAL/PLATELET
Abs Immature Granulocytes: 0.39 10*3/uL — ABNORMAL HIGH (ref 0.00–0.07)
Basophils Absolute: 0.1 10*3/uL (ref 0.0–0.1)
Basophils Relative: 1 %
Eosinophils Absolute: 0.1 10*3/uL (ref 0.0–0.5)
Eosinophils Relative: 1 %
HCT: 40.2 % (ref 39.0–52.0)
Hemoglobin: 12.6 g/dL — ABNORMAL LOW (ref 13.0–17.0)
Immature Granulocytes: 3 %
Lymphocytes Relative: 6 %
Lymphs Abs: 0.8 10*3/uL (ref 0.7–4.0)
MCH: 27.3 pg (ref 26.0–34.0)
MCHC: 31.3 g/dL (ref 30.0–36.0)
MCV: 87 fL (ref 80.0–100.0)
Monocytes Absolute: 1.4 10*3/uL — ABNORMAL HIGH (ref 0.1–1.0)
Monocytes Relative: 10 %
Neutro Abs: 11.4 10*3/uL — ABNORMAL HIGH (ref 1.7–7.7)
Neutrophils Relative %: 79 %
Platelets: 412 10*3/uL — ABNORMAL HIGH (ref 150–400)
RBC: 4.62 MIL/uL (ref 4.22–5.81)
RDW: 15.6 % — ABNORMAL HIGH (ref 11.5–15.5)
WBC: 14.2 10*3/uL — ABNORMAL HIGH (ref 4.0–10.5)
nRBC: 0 % (ref 0.0–0.2)

## 2022-06-15 LAB — COMPREHENSIVE METABOLIC PANEL
ALT: 34 U/L (ref 0–44)
AST: 56 U/L — ABNORMAL HIGH (ref 15–41)
Albumin: 3.3 g/dL — ABNORMAL LOW (ref 3.5–5.0)
Alkaline Phosphatase: 423 U/L — ABNORMAL HIGH (ref 38–126)
Anion gap: 10 (ref 5–15)
BUN: 43 mg/dL — ABNORMAL HIGH (ref 8–23)
CO2: 24 mmol/L (ref 22–32)
Calcium: 9.9 mg/dL (ref 8.9–10.3)
Chloride: 97 mmol/L — ABNORMAL LOW (ref 98–111)
Creatinine, Ser: 1.27 mg/dL — ABNORMAL HIGH (ref 0.61–1.24)
GFR, Estimated: >60 mL/min (ref >60)
Glucose, Bld: 140 mg/dL — ABNORMAL HIGH (ref 70–99)
Potassium: 4.9 mmol/L (ref 3.5–5.1)
Sodium: 131 mmol/L — ABNORMAL LOW (ref 135–145)
Total Bilirubin: 0.9 mg/dL (ref 0.3–1.2)
Total Protein: 8.3 g/dL — ABNORMAL HIGH (ref 6.5–8.1)

## 2022-06-15 LAB — PHOSPHORUS: Phosphorus: 3 mg/dL (ref 2.5–4.6)

## 2022-06-15 LAB — PROTEIN / CREATININE RATIO, URINE
Creatinine, Urine: 91.51 mg/dL
Protein Creatinine Ratio: 1.16 mg/mg{Cre} — ABNORMAL HIGH (ref 0.00–0.15)
Total Protein, Urine: 106 mg/dL

## 2022-06-15 LAB — FERRITIN: Ferritin: 593 ng/mL — ABNORMAL HIGH (ref 24–336)

## 2022-06-15 LAB — HEMOGLOBIN A1C
Hgb A1c MFr Bld: 6 % — ABNORMAL HIGH (ref 4.8–5.6)
Mean Plasma Glucose: 125.5 mg/dL

## 2022-06-15 LAB — HIV ANTIBODY (ROUTINE TESTING W REFLEX): HIV Screen 4th Generation wRfx: NONREACTIVE

## 2022-06-15 LAB — MAGNESIUM: Magnesium: 2 mg/dL (ref 1.7–2.4)

## 2022-06-15 LAB — IRON AND TIBC
Iron: 35 ug/dL — ABNORMAL LOW (ref 45–182)
Saturation Ratios: 13 % — ABNORMAL LOW (ref 17.9–39.5)
TIBC: 261 ug/dL (ref 250–450)
UIBC: 226 ug/dL

## 2022-06-15 LAB — VITAMIN B12: Vitamin B-12: 2544 pg/mL — ABNORMAL HIGH (ref 180–914)

## 2022-06-15 LAB — CREATININE, URINE, 24 HOUR
Collection Interval-UCRE24: 24 hours
Creatinine, 24H Ur: 961 mg/d (ref 800–2000)
Creatinine, Urine: 96.05 mg/dL
Urine Total Volume-UCRE24: 1000 mL

## 2022-06-15 LAB — VITAMIN D 25 HYDROXY (VIT D DEFICIENCY, FRACTURES): Vit D, 25-Hydroxy: 38.55 ng/mL (ref 30–100)

## 2022-06-15 LAB — PROTEIN, URINE, 24 HOUR
Collection Interval-UPROT: 24 hours
Protein, Urine: 105 mg/dL
Urine Total Volume-UPROT: 1000 mL

## 2022-06-15 LAB — URIC ACID: Uric Acid, Serum: 8.1 mg/dL (ref 3.7–8.6)

## 2022-06-15 NOTE — Discharge Instructions (Addendum)
Call your primary care doctor or specialist as discussed in the next 2-3 days.   Return immediately back to the ER if:  Your symptoms worsen within the next 12-24 hours. You develop new symptoms such as new fevers, persistent vomiting, new pain, shortness of breath, or new weakness or numbness, or if you have any other concerns.  

## 2022-06-15 NOTE — ED Triage Notes (Signed)
Hx of liver CA. Wife reports pt having SOB, lower leg swelling, urinating less than normal.   Pt states he normally goes a lot at night. But today has been unusual for urinating.   Pt reports lower back pain.

## 2022-06-15 NOTE — ED Notes (Signed)
Bladder scan =58m  Pt is using the urinal at this time

## 2022-06-15 NOTE — ED Provider Notes (Signed)
[facility] EMERGENCY DEPARTMENT Provider Note   CSN: [id number] Arrival date & time: 06/15/22  1655     History  Chief Complaint  Patient presents with   Leg Swelling   Shortness of Breath    Bryce Adams is a 71 y.o. male.  Patient presents ER chief complaint of decreased urine output.  He states been going on for a day now.  He has a history of liver cancer and sees a kidney specialist.  He had multiple blood test done this morning and does not want any repeated.  He says he is gotten a mild sensation of shortness of breath but no chest pain.  No fever no cough no vomiting.  Triage notation says back pain, however the patient states it is not unusual for him given his cancer to have back pain and that it is unchanged today.       Home Medications Prior to Admission medications   Medication Sig Start Date End Date Taking? Authorizing Provider  Atezolizumab (TECENTRIQ IV) Inject into the vein every 21 ( twenty-one) days. 05/15/22   [provider]  atorvastatin (LIPITOR) 20 MG tablet Take 20 mg by mouth every morning.    [provider]  Bevacizumab (AVASTIN IV) Inject into the vein every 21 ( twenty-one) days. 05/15/22   [provider]  calcium carbonate (OSCAL) 1500 (600 Ca) MG TABS tablet Take 600 mg of elemental calcium by mouth daily.    [provider]  clopidogrel (PLAVIX) 75 MG tablet Take 1 tablet (75 mg total) by mouth daily. 09/14/21 09/14/22  Manuella Ghazi, Pratik D, DO  Continuous Blood Gluc Sensor (DEXCOM G6 SENSOR) MISC Apply topically daily. 11/05/21   [provider]  famotidine (PEPCID) 20 MG tablet Take 20 mg by mouth 2 (two) times daily. Once in A.m. and Once in P.M.    [provider]  furosemide (LASIX) 20 MG tablet Take 1 tablet (20 mg total) by mouth daily as needed. 06/05/22   Derek Jack, MD  hydrochlorothiazide (HYDRODIURIL) 25 MG tablet Take 25 mg by mouth every morning.    [provider]   HYDROcodone-acetaminophen (NORCO) 10-325 MG tablet Take 1 tablet by mouth every 4 (four) hours as needed. 05/09/22   Derek Jack, MD  Insulin Pen Needle (B-D UF III MINI PEN NEEDLES) 31G X 5 MM MISC USE EVERY DAY 12/23/21   [provider]  irbesartan (AVAPRO) 300 MG tablet Take 300 mg by mouth daily. 02/11/21   [provider]  Lactulose 20 GM/30ML SOLN Take 30 mLs (20 g total) by mouth every 3 (three) hours as needed. 05/04/22   Derek Jack, MD  levothyroxine (SYNTHROID, LEVOTHROID) 50 MCG tablet Take 50 mcg by mouth every morning.    [provider]  lidocaine-prilocaine (EMLA) cream Apply a small amount to port a cath site and cover with plastic wrap 1 hour prior to infusion appointments 05/11/22   Derek Jack, MD  Magnesium 250 MG TABS Take 250 mg by mouth in the morning and at bedtime.    [provider]  megestrol (MEGACE) 400 MG/10ML suspension Take 10 mLs (400 mg total) by mouth 2 (two) times daily. 06/12/22   Derek Jack, MD  metFORMIN (GLUCOPHAGE) 1000 MG tablet Take 2,000 mg by mouth 2 (two) times daily with a meal.    [provider]  morphine (MS CONTIN) 30 MG 12 hr tablet Take 1 tablet (30 mg total) by mouth every 12 (twelve) hours. 06/12/22   Delton Coombes,  Dirk Dress, MD  Multiple Vitamins-Minerals (CENTRUM SILVER 50+MEN) TABS Take 1 tablet by mouth daily.    [provider]  Omega-3 Fatty Acids (FISH OIL) 1200 MG CAPS Take 1,200 mg by mouth in the morning and at bedtime.    [provider]  ondansetron (ZOFRAN) 8 MG tablet Take 1 tablet (8 mg total) by mouth every 8 (eight) hours as needed for nausea or vomiting. 05/15/22   Derek Jack, MD  [facility] VERIO test strip daily. 01/24/22   [provider]  sitaGLIPtin (JANUVIA) 100 MG tablet Take 100 mg by mouth daily.    [provider]  VICTOZA 18 MG/3ML SOPN Inject 1.8 mg into the skin every evening. 02/27/17   [provider]      Allergies    Fentanyl and Compazine [prochlorperazine]    Review of Systems   Review of Systems  Constitutional:  Negative for fever.  HENT:  Negative for ear pain and sore throat.   Eyes:  Negative for pain.  Respiratory:  Positive for shortness of breath. Negative for cough.   Cardiovascular:  Negative for chest pain.  Gastrointestinal:  Negative for abdominal pain.  Genitourinary:  Negative for flank pain.  Skin:  Negative for color change and rash.  Neurological:  Negative for syncope.  All other systems reviewed and are negative.   Physical Exam Updated Vital Signs BP 133/71 (BP Location: Right Arm)   Pulse 88   Temp (!) 97.5 F (36.4 C) (Oral)   Resp 18   Ht 5' 7.5" (1.715 m)   Wt 73.9 kg   SpO2 99%   BMI 25.15 kg/m  Physical Exam Constitutional:      Appearance: He is well-developed.  HENT:     Head: Normocephalic.     Nose: Nose normal.  Eyes:     Extraocular Movements: Extraocular movements intact.  Cardiovascular:     Rate and Rhythm: Normal rate.  Pulmonary:     Effort: Pulmonary effort is normal.  Abdominal:     General: There is no distension.     Tenderness: There is no abdominal tenderness. There is no guarding.  Skin:    Coloration: Skin is not jaundiced.  Neurological:     Mental Status: He is alert. Mental status is at baseline.     ED Results / Procedures / Treatments   Labs (all labs ordered are listed, but only abnormal results are displayed) Labs Reviewed - No data to display  EKG None  Radiology DG Chest [facility] 1 View  Result Date: 06/15/2022 CLINICAL DATA:  History of liver cancer, shortness of breath, lower leg swelling EXAM: PORTABLE CHEST 1 VIEW COMPARISON:  05/29/2022 FINDINGS: 7 mm right lower lobe pulmonary nodule. No focal consolidation. No pleural effusion or pneumothorax. Heart and mediastinal contours are unremarkable. Left-sided Port-A-Cath with the tip projecting over the SVC. No acute osseous  abnormality. IMPRESSION: 1. No acute cardiopulmonary disease. 2. 7 mm right lower lobe pulmonary nodule concerning for metastatic disease given the patient's history. Recommend further evaluation with a nonemergent CT of the chest. Electronically Signed   By: Kathreen Devoid M.D.   On: 06/15/2022 19:12    Procedures Procedures    Medications Ordered in ED Medications - No data to display  ED Course/ Medical Decision Making/ A&P                           Medical Decision Making Amount and/or Complexity of Data  Reviewed Radiology: ordered.   Chart review shows office visit June 13, 2022 for diabetes.  History from family bedside.  Sinuses were sent chest x-ray is unremarkable.  Patient's vital signs are within normal limits exam is benign no abdominal guarding or tenderness mild distention noted but no discomfort on exam.  Bladder scan shows about 90 cc of urine.  Patient subsequently able to urinate about 75 cc per nursing staff.  States he feels fine at this time and prefers to go home.  I advised return if he develops chest pain fevers difficulty breathing or any additional concerns return back to the ER otherwise discharged home in stable condition to follow-up with his oncology specialist this week.  For        Final Clinical Impression(s) / ED Diagnoses Final diagnoses:  Shortness of breath    Rx / DC Orders ED Discharge Orders     None         Luna Fuse, MD 06/15/22 2027

## 2022-06-16 ENCOUNTER — Other Ambulatory Visit: Payer: Self-pay

## 2022-06-16 DIAGNOSIS — C220 Liver cell carcinoma: Secondary | ICD-10-CM

## 2022-06-16 DIAGNOSIS — K7460 Unspecified cirrhosis of liver: Secondary | ICD-10-CM

## 2022-06-16 LAB — MISC LABCORP TEST (SEND OUT): Labcorp test code: 141330

## 2022-06-16 LAB — ANTINUCLEAR ANTIBODIES, IFA: ANA Ab, IFA: NEGATIVE

## 2022-06-16 LAB — ANCA TITERS
Atypical P-ANCA titer: <1:20 {titer}
C-ANCA: <1:20 {titer}
P-ANCA: <1:20 {titer}

## 2022-06-16 LAB — PTH, INTACT AND CALCIUM
Calcium, Total (PTH): 9.7 mg/dL (ref 8.6–10.2)
PTH: 17 pg/mL (ref 15–65)

## 2022-06-16 LAB — C3 COMPLEMENT: C3 Complement: 228 mg/dL — ABNORMAL HIGH (ref 82–167)

## 2022-06-16 LAB — KAPPA/LAMBDA LIGHT CHAINS
Kappa free light chain: 44.3 mg/L — ABNORMAL HIGH (ref 3.3–19.4)
Kappa, lambda light chain ratio: 1.62 (ref 0.26–1.65)
Lambda free light chains: 27.3 mg/L — ABNORMAL HIGH (ref 5.7–26.3)

## 2022-06-16 LAB — C4 COMPLEMENT: Complement C4, Body Fluid: 50 mg/dL — ABNORMAL HIGH (ref 12–38)

## 2022-06-16 LAB — GLOMERULAR BASEMENT MEMBRANE ANTIBODIES: GBM Ab: <0.2 units (ref 0.0–0.9)

## 2022-06-16 NOTE — Progress Notes (Signed)
EGD at [facility] with [place]

## 2022-06-18 ENCOUNTER — Telehealth: Payer: Self-pay | Admitting: Gastroenterology

## 2022-06-18 DIAGNOSIS — R1310 Dysphagia, unspecified: Secondary | ICD-10-CM

## 2022-06-18 NOTE — Telephone Encounter (Signed)
Patient unfortunately not a candidate for EGD at the [facility] because of his ASA IV disease.  He warrants an EGD at the hospital to screen for varices and evaluate his dysphagia given his liver disease / malignancy and at risk for bleeding given Plavix use and his chemotherapy. Fortunately his platelet count is normal which could argue against significant portal hypertension. I noted his modified barium study did not show a clear cause for his dysphagia.  Spots for outpatient endoscopy at the hospital are quite limited. I offered him spots in August and he could not make it. His EGD is scheduled for early Sept.   [place] can you make sure the patient schedules a barium swallow with tablet to assess for any high grade dysphagia. My plan was initially to do his EGD ON Plavix given I would not perform any dilation as he is already on Bevacizumab which increases risk for bleeding. If he has no significant stricturing on barium swallow then that will still be the case. If he has a significant stricture on his barium study then will need to discuss with his Oncologist how we are going to proceed with Bevacizumab on board. If he has varices that would also complicate this if he would potentially need a dilation, again hopefully that is not the case.   Jan, just an FYI, I need the barium study back first but assuming no high grade stenosis will likely not need to hold Plavix for this.  [place] can you please contact the patient and make sure he schedules barium swallow with tablet for planning purposes. Thanks

## 2022-06-19 ENCOUNTER — Telehealth (HOSPITAL_COMMUNITY): Payer: Self-pay | Admitting: *Deleted

## 2022-06-19 ENCOUNTER — Other Ambulatory Visit (HOSPITAL_COMMUNITY): Payer: Self-pay

## 2022-06-19 ENCOUNTER — Other Ambulatory Visit (HOSPITAL_COMMUNITY): Payer: Self-pay | Admitting: *Deleted

## 2022-06-19 DIAGNOSIS — C220 Liver cell carcinoma: Secondary | ICD-10-CM

## 2022-06-19 LAB — PROTEIN ELECTROPHORESIS, SERUM
A/G Ratio: 0.7 (ref 0.7–1.7)
Albumin ELP: 2.8 g/dL — ABNORMAL LOW (ref 2.9–4.4)
Alpha-1-Globulin: 0.5 g/dL — ABNORMAL HIGH (ref 0.0–0.4)
Alpha-2-Globulin: 1.6 g/dL — ABNORMAL HIGH (ref 0.4–1.0)
Beta Globulin: 1 g/dL (ref 0.7–1.3)
Gamma Globulin: 1 g/dL (ref 0.4–1.8)
Globulin, Total: 4.2 g/dL — ABNORMAL HIGH (ref 2.2–3.9)
Total Protein ELP: 7 g/dL (ref 6.0–8.5)

## 2022-06-19 NOTE — Addendum Note (Signed)
Addended by: Roetta Sessions on: 06/19/2022 09:46 AM   Modules accepted: Orders

## 2022-06-19 NOTE — Telephone Encounter (Signed)
Received telephone call from wife to advise that he had been in the ER for urinary retention over the weekend.  Wife states that they have stopped taking the hydrochlorothiazide, as this has not been effective in is now taking the lasix 20 mg exclusively with positive results.

## 2022-06-19 NOTE — Telephone Encounter (Signed)
Placed order for Barium Swallow w/ tablet at [facility]. Scheduled for Wed, 8-9 at 10:00 am to arrive at 9:45 am, NPO 3 hours.MyChart message to patient to confirm appointment.

## 2022-06-19 NOTE — Telephone Encounter (Signed)
Patient confirmed appointment and has been scheduled at [facility] on 8-9 for Barium Swallow w/ Tab

## 2022-06-21 ENCOUNTER — Telehealth: Payer: Self-pay | Admitting: Orthopedic Surgery

## 2022-06-21 ENCOUNTER — Other Ambulatory Visit (HOSPITAL_COMMUNITY): Payer: Self-pay

## 2022-06-21 ENCOUNTER — Ambulatory Visit (HOSPITAL_COMMUNITY)
Admission: RE | Admit: 2022-06-21 | Discharge: 2022-06-21 | Disposition: A | Discharge status: Home / Self Care | Payer: No Typology Code available for payment source | Source: Ambulatory Visit | Attending: Nephrology | Admitting: Nephrology

## 2022-06-21 DIAGNOSIS — E1122 Type 2 diabetes mellitus with diabetic chronic kidney disease: Secondary | ICD-10-CM | POA: Insufficient documentation

## 2022-06-21 DIAGNOSIS — E1129 Type 2 diabetes mellitus with other diabetic kidney complication: Secondary | ICD-10-CM | POA: Insufficient documentation

## 2022-06-21 DIAGNOSIS — R809 Proteinuria, unspecified: Secondary | ICD-10-CM | POA: Insufficient documentation

## 2022-06-21 MED ORDER — HYDROCODONE-ACETAMINOPHEN 10-325 MG PO TABS
1.0000 | ORAL_TABLET | ORAL | 0 refills | Status: AC | PRN
Start: 2022-06-21 — End: ?

## 2022-06-21 NOTE — Telephone Encounter (Signed)
Returned call to patient/wife, per voice message received this afternoon, and was told that they have it taken care of. Thanked [place] for calling back.

## 2022-06-22 LAB — IMMUNOFIXATION ELECTROPHORESIS
IgA: 158 mg/dL (ref 61–437)
IgG (Immunoglobin G), Serum: 1247 mg/dL (ref 603–1613)
IgM (Immunoglobulin M), Srm: 52 mg/dL (ref 20–172)
Total Protein ELP: 6.7 g/dL (ref 6.0–8.5)

## 2022-06-26 ENCOUNTER — Inpatient Hospital Stay (HOSPITAL_COMMUNITY): Payer: No Typology Code available for payment source

## 2022-06-26 ENCOUNTER — Inpatient Hospital Stay (HOSPITAL_BASED_OUTPATIENT_CLINIC_OR_DEPARTMENT_OTHER): Payer: No Typology Code available for payment source | Admitting: Hematology

## 2022-06-26 VITALS — BP 128/77 | HR 98 | Temp 97.4°F | Resp 20 | Ht 67.5 in | Wt 159.2 lb

## 2022-06-26 VITALS — BP 131/87 | HR 90 | Temp 97.2°F | Resp 18

## 2022-06-26 DIAGNOSIS — Z5112 Encounter for antineoplastic immunotherapy: Secondary | ICD-10-CM | POA: Diagnosis not present

## 2022-06-26 DIAGNOSIS — E875 Hyperkalemia: Secondary | ICD-10-CM

## 2022-06-26 DIAGNOSIS — C220 Liver cell carcinoma: Secondary | ICD-10-CM

## 2022-06-26 DIAGNOSIS — Z95828 Presence of other vascular implants and grafts: Secondary | ICD-10-CM

## 2022-06-26 LAB — CBC WITH DIFFERENTIAL/PLATELET
Abs Immature Granulocytes: 0.53 10*3/uL — ABNORMAL HIGH (ref 0.00–0.07)
Basophils Absolute: 0.2 10*3/uL — ABNORMAL HIGH (ref 0.0–0.1)
Basophils Relative: 1 %
Eosinophils Absolute: 0.1 10*3/uL (ref 0.0–0.5)
Eosinophils Relative: 1 %
HCT: 37.3 % — ABNORMAL LOW (ref 39.0–52.0)
Hemoglobin: 11.7 g/dL — ABNORMAL LOW (ref 13.0–17.0)
Immature Granulocytes: 4 %
Lymphocytes Relative: 5 %
Lymphs Abs: 0.8 10*3/uL (ref 0.7–4.0)
MCH: 27.3 pg (ref 26.0–34.0)
MCHC: 31.4 g/dL (ref 30.0–36.0)
MCV: 87.1 fL (ref 80.0–100.0)
Monocytes Absolute: 1.5 10*3/uL — ABNORMAL HIGH (ref 0.1–1.0)
Monocytes Relative: 11 %
Neutro Abs: 11.3 10*3/uL — ABNORMAL HIGH (ref 1.7–7.7)
Neutrophils Relative %: 78 %
Platelets: 313 10*3/uL (ref 150–400)
RBC: 4.28 MIL/uL (ref 4.22–5.81)
RDW: 16.7 % — ABNORMAL HIGH (ref 11.5–15.5)
WBC: 14.4 10*3/uL — ABNORMAL HIGH (ref 4.0–10.5)
nRBC: 0 % (ref 0.0–0.2)

## 2022-06-26 LAB — COMPREHENSIVE METABOLIC PANEL
ALT: 44 U/L (ref 0–44)
AST: 60 U/L — ABNORMAL HIGH (ref 15–41)
Albumin: 2.7 g/dL — ABNORMAL LOW (ref 3.5–5.0)
Alkaline Phosphatase: 521 U/L — ABNORMAL HIGH (ref 38–126)
Anion gap: 8 (ref 5–15)
BUN: 36 mg/dL — ABNORMAL HIGH (ref 8–23)
CO2: 24 mmol/L (ref 22–32)
Calcium: 9.8 mg/dL (ref 8.9–10.3)
Chloride: 99 mmol/L (ref 98–111)
Creatinine, Ser: 1 mg/dL (ref 0.61–1.24)
GFR, Estimated: >60 mL/min (ref >60)
Glucose, Bld: 118 mg/dL — ABNORMAL HIGH (ref 70–99)
Potassium: 5.4 mmol/L — ABNORMAL HIGH (ref 3.5–5.1)
Sodium: 131 mmol/L — ABNORMAL LOW (ref 135–145)
Total Bilirubin: 1.2 mg/dL (ref 0.3–1.2)
Total Protein: 7.2 g/dL (ref 6.5–8.1)

## 2022-06-26 LAB — URINALYSIS, DIPSTICK ONLY
Bilirubin Urine: NEGATIVE
Glucose, UA: 150 mg/dL — AB
Hgb urine dipstick: NEGATIVE
Ketones, ur: 5 mg/dL — AB
Leukocytes,Ua: NEGATIVE
Nitrite: NEGATIVE
Protein, ur: 100 mg/dL — AB
Specific Gravity, Urine: 1.023 (ref 1.005–1.030)
pH: 5 (ref 5.0–8.0)

## 2022-06-26 LAB — MAGNESIUM: Magnesium: 1.8 mg/dL (ref 1.7–2.4)

## 2022-06-26 MED ORDER — SODIUM CHLORIDE 0.9 % IV SOLN: Freq: Once | INTRAVENOUS | Status: AC

## 2022-06-26 MED ORDER — HEPARIN SOD (PORK) LOCK FLUSH 100 UNIT/ML IV SOLN
500.0000 [IU] | Freq: Once | INTRAVENOUS | Status: AC | PRN
  Administered 2022-06-26: 500 [IU]

## 2022-06-26 MED ORDER — SODIUM CHLORIDE 0.9 % IV SOLN
15.0000 mg/kg | Freq: Once | INTRAVENOUS | Status: AC
  Administered 2022-06-26: 1200 mg via INTRAVENOUS
  Filled 2022-06-26: qty 48

## 2022-06-26 MED ORDER — SODIUM CHLORIDE 0.9% FLUSH
10.0000 mL | INTRAVENOUS | Status: DC | PRN
  Administered 2022-06-26: 10 mL

## 2022-06-26 MED ORDER — SODIUM POLYSTYRENE SULFONATE 15 GM/60ML PO SUSP
60.0000 g | Freq: Once | ORAL | Status: AC
  Administered 2022-06-26: 60 g via ORAL
  Filled 2022-06-26: qty 240

## 2022-06-26 MED ORDER — SODIUM CHLORIDE 0.9 % IV SOLN
1200.0000 mg | Freq: Once | INTRAVENOUS | Status: AC
  Administered 2022-06-26: 1200 mg via INTRAVENOUS
  Filled 2022-06-26: qty 20

## 2022-06-26 NOTE — Progress Notes (Signed)
Patients port flushed without difficulty.  Good blood return noted with no bruising or swelling noted at site.  Stable during access and blood draw.  Patient to remain accessed for tretment.

## 2022-06-26 NOTE — Progress Notes (Signed)
[place], [place] [address]   CLINIC:  Medical Oncology/Hematology  PCP:  Eber Hong, MD [id number] / [place] [address] [phone]   REASON FOR VISIT:  Follow-up for right hepatic lobe mass  PRIOR THERAPY: none  NGS Results: not done  CURRENT THERAPY: Atezolizumab + Bevacizumab q21d Maintenance  BRIEF ONCOLOGIC HISTORY:  Oncology History  Hepatocellular carcinoma ([place])  05/08/2022 Initial Diagnosis   Hepatocellular carcinoma ([place])   05/15/2022 -  Chemotherapy   Patient is on Treatment Plan : LIVER Atezolizumab + Bevacizumab q21d Maintenance       CANCER STAGING:  Cancer Staging  Hepatocellular carcinoma ([place]) Staging form: Liver, AJCC 8th Edition - Clinical stage from 05/08/2022: Stage IVB (cT4, cN1, pM1) - Unsigned   INTERVAL HISTORY:  [name], a 71 y.o. male, returns for routine follow-up and consideration for next cycle of chemotherapy. Bryce Adams was last seen on 06/05/2022.  Due for cycle #3 of Atezolizumab + Bevacizumab today.   Overall, he tells me he has been feeling pretty well. His right side and lower back pain are stable. He has mild swelling in his legs. He continues to take MS contin and Hydrocodone. He continues to take constipation for which he uses Colace and Lactulose. He denies diarrhea. He reports coughing at night. He reports fatigue for 2 days after last treatment. He is walking with a cane. He denies falls. He has lost 3 lbs since his last visit.   Overall, he feels ready for next cycle of chemo today.    REVIEW OF SYSTEMS:  Review of Systems  Constitutional:  Positive for fatigue and unexpected weight change (-3 lbs). Negative for appetite change.  Respiratory:  Positive for cough.   Cardiovascular:  Positive for leg swelling (mild).  Gastrointestinal:  Positive for abdominal pain (6/10 R sid - stable'), constipation and nausea. Negative for diarrhea.  Musculoskeletal:  Positive for  back pain (stable).  Psychiatric/Behavioral:  Positive for depression and sleep disturbance. The patient is nervous/anxious.   All other systems reviewed and are negative.   PAST MEDICAL/SURGICAL HISTORY:  Past Medical History:  Diagnosis Date   Diabetes mellitus without complication ([place])    Hyperlipemia    Hypertension    Kidney disease    Liver cancer ([place])    Port-A-Cath in place 05/11/2022   Renal disorder    Stroke [facility])    Thyroid disease    Past Surgical History:  Procedure Laterality Date   CARDIAC CATHETERIZATION     CARPAL TUNNEL RELEASE     HAND TENDON SURGERY     PORTACATH PLACEMENT Left 05/29/2022   Procedure: INSERTION PORT-A-CATH;  Surgeon: Aviva Signs, MD;  Location: [facility];  Service: General;  Laterality: Left;    SOCIAL HISTORY:  Social History   Socioeconomic History   Marital status: Married    Spouse name: Not on file   Number of children: 3   Years of education: Not on file   Highest education level: Not on file  Occupational History   Occupation: retired  Tobacco Use   Smoking status: Every Day    Packs/day: 2.00    Types: Cigarettes   Smokeless tobacco: Never  Vaping Use   Vaping Use: Never used  Substance and Sexual Activity   Alcohol use: Yes    Alcohol/week: 1.0 standard drink of alcohol    Types: 1 Cans of beer per week    Comment: occasionally    Drug use: No  Sexual activity: Not on file  Other Topics Concern   Not on file  Social History Narrative   Not on file   Social Determinants of Health   Financial Resource Strain: Not on file  Food Insecurity: Not on file  Transportation Needs: Not on file  Physical Activity: Not on file  Stress: Not on file  Social Connections: Not on file  Intimate Partner Violence: Not on file    FAMILY HISTORY:  Family History  Problem Relation Age of Onset   Colon polyps Mother    Colon cancer Mother    Heart disease Father    Diabetes Father    Heart disease Paternal Grandmother     Heart disease Paternal Grandfather    Emphysema Paternal Grandfather    Diabetes Daughter    Esophageal cancer Neg Hx    Rectal cancer Neg Hx    Stomach cancer Neg Hx     CURRENT MEDICATIONS:  Current Outpatient Medications  Medication Sig Dispense Refill   Atezolizumab (TECENTRIQ IV) Inject into the vein every 21 ( twenty-one) days.     atorvastatin (LIPITOR) 20 MG tablet Take 20 mg by mouth every morning.     Bevacizumab (AVASTIN IV) Inject into the vein every 21 ( twenty-one) days.     calcium carbonate (OSCAL) 1500 (600 Ca) MG TABS tablet Take 600 mg of elemental calcium by mouth daily.     clopidogrel (PLAVIX) 75 MG tablet Take 1 tablet (75 mg total) by mouth daily. 30 tablet 11   Continuous Blood Gluc Sensor (DEXCOM G6 SENSOR) MISC Apply topically daily.     famotidine (PEPCID) 20 MG tablet Take 20 mg by mouth 2 (two) times daily. Once in A.m. and Once in P.M.     furosemide (LASIX) 20 MG tablet Take 1 tablet (20 mg total) by mouth daily as needed. 30 tablet 3   HYDROcodone-acetaminophen (NORCO) 10-325 MG tablet Take 1 tablet by mouth every 4 (four) hours as needed. 180 tablet 0   Insulin Pen Needle (B-D UF III MINI PEN NEEDLES) 31G X 5 MM MISC USE EVERY DAY     irbesartan (AVAPRO) 300 MG tablet Take 300 mg by mouth daily.     Lactulose 20 GM/30ML SOLN Take 30 mLs (20 g total) by mouth every 3 (three) hours as needed. 450 mL 1   levothyroxine (SYNTHROID, LEVOTHROID) 50 MCG tablet Take 50 mcg by mouth every morning.     lidocaine-prilocaine (EMLA) cream Apply a small amount to port a cath site and cover with plastic wrap 1 hour prior to infusion appointments 30 g 3   Magnesium 250 MG TABS Take 250 mg by mouth in the morning and at bedtime.     megestrol (MEGACE) 400 MG/10ML suspension Take 10 mLs (400 mg total) by mouth 2 (two) times daily. 480 mL 4   metFORMIN (GLUCOPHAGE) 1000 MG tablet Take 2,000 mg by mouth 2 (two) times daily with a meal.     morphine (MS CONTIN) 30 MG 12 hr  tablet Take 1 tablet (30 mg total) by mouth every 12 (twelve) hours. 60 tablet 0   Multiple Vitamins-Minerals (CENTRUM SILVER 50+MEN) TABS Take 1 tablet by mouth daily.     Omega-3 Fatty Acids (FISH OIL) 1200 MG CAPS Take 1,200 mg by mouth in the morning and at bedtime.     ondansetron (ZOFRAN) 8 MG tablet Take 1 tablet (8 mg total) by mouth every 8 (eight) hours as needed for nausea or vomiting. 90 tablet  2   ONETOUCH VERIO test strip daily.     sitaGLIPtin (JANUVIA) 100 MG tablet Take 100 mg by mouth daily.     VICTOZA 18 MG/3ML SOPN Inject 1.8 mg into the skin every evening.  4   No current facility-administered medications for this visit.    ALLERGIES:  Allergies  Allergen Reactions   Fentanyl Shortness Of Breath   Compazine [Prochlorperazine]     Hallucinations, vivid dreams    PHYSICAL EXAM:  Performance status (ECOG): 1 - Symptomatic but completely ambulatory  There were no vitals filed for this visit. Wt Readings from Last 3 Encounters:  06/15/22 163 lb (73.9 kg)  06/05/22 162 lb 3.2 oz (73.6 kg)  06/01/22 168 lb 8 oz (76.4 kg)   Physical Exam Vitals reviewed.  Constitutional:      Appearance: Normal appearance.     Comments: In wheelchair  Cardiovascular:     Rate and Rhythm: Normal rate and regular rhythm.     Pulses: Normal pulses.     Heart sounds: Normal heart sounds.  Pulmonary:     Effort: Pulmonary effort is normal.     Breath sounds: Normal breath sounds.  Neurological:     General: No focal deficit present.     Mental Status: He is alert and oriented to person, place, and time.  Psychiatric:        Mood and Affect: Mood normal.        Behavior: Behavior normal.     LABORATORY DATA:  I have reviewed the labs as listed.     Latest Ref Rng & Units 06/15/2022   10:04 AM 06/05/2022    9:14 AM 05/15/2022    9:02 AM  CBC  WBC 4.0 - 10.5 K/uL 14.2  10.3  13.3   Hemoglobin 13.0 - 17.0 g/dL 12.6  10.8  11.8   Hematocrit 39.0 - 52.0 % 40.2  33.7  35.8    Platelets 150 - 400 K/uL 412  221  307       Latest Ref Rng & Units 06/15/2022   10:04 AM 06/05/2022    9:14 AM 05/15/2022    9:02 AM  CMP  Glucose 70 - 99 mg/dL 140  152  197   BUN 8 - 23 mg/dL 43  35  27   Creatinine 0.61 - 1.24 mg/dL 1.27  0.91  1.06   Sodium 135 - 145 mmol/L 131  131  131   Potassium 3.5 - 5.1 mmol/L 4.9  4.8  5.3   Chloride 98 - 111 mmol/L 97  97  95   CO2 22 - 32 mmol/L _0 Calcium 8.9 - 10.3 mg/dL 8.6 - 10.2 mg/dL 9.9    9.7  8.8  9.6   Total Protein 6.5 - 8.1 g/dL 8.3  6.8  7.6   Total Bilirubin 0.3 - 1.2 mg/dL 0.9  1.0  0.5   Alkaline Phos 38 - 126 U/L 423  470  308   AST 15 - 41 U/L 56  57  50   ALT 0 - 44 U/L 34  42  29     DIAGNOSTIC IMAGING:  I have independently reviewed the scans and discussed with the patient. US RENAL  Result Date: 06/21/2022 CLINICAL DATA:  Chronic renal disease.  Proteinuria. EXAM: RENAL / URINARY TRACT ULTRASOUND COMPLETE COMPARISON:  None Available. FINDINGS: Right Kidney: Renal measurements: 13.4 x 6.8 x 7.3 cm = volume: 347 mL. Echogenicity within normal limits.  No mass or hydronephrosis visualized. Left Kidney: Renal measurements: 12.8 x 7.2 x 6.8 cm = volume: 327 mL. Contains a 1.1 cm cyst. No follow-up imaging recommended for the cyst. Bladder: Appears normal for degree of bladder distention. Other: Ascites in the pelvis. IMPRESSION: 1. The kidneys are unremarkable. Small postvoid residual in the bladder. 2. Ascites in the pelvis. Electronically Signed   By: Dorise Bullion III M.D.   On: 06/21/2022 13:03   DG Chest Port 1 View  Result Date: 06/15/2022 CLINICAL DATA:  History of liver cancer, shortness of breath, lower leg swelling EXAM: PORTABLE CHEST 1 VIEW COMPARISON:  05/29/2022 FINDINGS: 7 mm right lower lobe pulmonary nodule. No focal consolidation. No pleural effusion or pneumothorax. Heart and mediastinal contours are unremarkable. Left-sided Port-A-Cath with the tip projecting over the SVC. No acute osseous  abnormality. IMPRESSION: 1. No acute cardiopulmonary disease. 2. 7 mm right lower lobe pulmonary nodule concerning for metastatic disease given the patient's history. Recommend further evaluation with a nonemergent CT of the chest. Electronically Signed   By: Kathreen Devoid M.D.   On: 06/15/2022 19:12   DG Swallowing Func-Speech Pathology  Result Date: 06/13/2022 Table formatting from the original result was not included. Images from the original result were not included. Objective Swallowing Evaluation: Type of Study: MBS-Modified Barium Swallow Study  Patient Details Name: Bryce Adams MRN: [id number] Date of Birth: 04-27-1951 Today's Date: 06/13/2022 Time: No data recorded-No data recorded No data recorded Past Medical History: Past Medical History: Diagnosis Date  Diabetes mellitus without complication ([place])   Hyperlipemia   Hypertension   Kidney disease   Liver cancer ([place])   Port-A-Cath in place 05/11/2022  Renal disorder   Stroke [facility])   Thyroid disease  Past Surgical History: Past Surgical History: Procedure Laterality Date  CARDIAC CATHETERIZATION    CARPAL TUNNEL RELEASE    HAND TENDON SURGERY    PORTACATH PLACEMENT Left 05/29/2022  Procedure: INSERTION PORT-A-CATH;  Surgeon: Aviva Signs, MD;  Location: [facility];  Service: General;  Laterality: Left; HPI: Aimee Timmons is a 71 y.o. male with PMH of DM type II, HLD, HTN, CVA and thyroid disease. 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  Pt has been complaining of getting "strangled" in the night and throat discomfort. Pt denies dysphagia with PO, however he does report some difficulty swallowing pills sometimes. Modified barium swallow test requested.  No data recorded  Recommendations for follow up therapy are one component of a multi-disciplinary discharge planning process, led by the attending physician.  Recommendations may be updated based on patient status, additional functional criteria and  insurance authorization. Assessment / Plan / Recommendation   06/13/2022  11:00 AM Clinical Impressions Clinical Impression Pt presents with oropharyngeal swallowing to be essentially within functional limits. Note adequate oral phase with timely AP transit and swallowing trigger, good laryngeal vestibule closure and airway protection. Slightly diminished pharyngeal squeeze and BOT retraction results in mild valleculae residue with thin liquids, cued throat clear and repeat dry swallow is effective in clearing majority of residue. Barium tablet was consumed with thin liquids without incident. Recommend continue with regular diet and thin liquids. Recommen occasional throat clear and dry swallows throughout meal. Meds are ok whole with liquids. There are no further ST needs noted at this time. SLP Visit Diagnosis Dysphagia, unspecified (R13.10) Impact on safety and function Mild aspiration risk     06/13/2022  11:00 AM Treatment Recommendations Treatment Recommendations No treatment recommended  at this time      No data to display      06/13/2022  11:00 AM Diet Recommendations SLP Diet Recommendations Regular solids;Thin liquid Liquid Administration via Cup;Straw Medication Administration Whole meds with liquid Compensations Clear throat intermittently;Multiple dry swallows after each bite/sip     06/13/2022  11:00 AM Other Recommendations Oral Care Recommendations Oral care BID Follow Up Recommendations No SLP follow up    No data to display        06/13/2022  11:00 AM Oral Phase Oral Phase [facility]    06/13/2022  11:00 AM Pharyngeal Phase Pharyngeal Phase Impaired Pharyngeal- Thin Teaspoon Pharyngeal residue - valleculae;Reduced tongue base retraction;Reduced pharyngeal peristalsis Pharyngeal- Thin Cup Pharyngeal residue - valleculae;Pharyngeal residue - pyriform;Reduced pharyngeal peristalsis;Reduced tongue base retraction Pharyngeal- Thin Straw Reduced tongue base retraction;Reduced pharyngeal peristalsis;Pharyngeal residue  - valleculae;Pharyngeal residue - pyriform Pharyngeal- Puree WFL Pharyngeal- Mechanical Soft NT Pharyngeal- Regular WFL Pharyngeal- Multi-consistency NT Pharyngeal- Pill [facility]    06/13/2022  11:00 AM Cervical Esophageal Phase  Cervical Esophageal Phase WFL Amelia H. Roddie Mc, CCC-SLP Speech Language Pathologist Wende Bushy 06/13/2022, 11:20 AM                     DG C-Arm 1-60 Min-No Report  Result Date: 05/29/2022 Fluoroscopy was utilized by the requesting physician.  No radiographic interpretation.   DG Chest Port 1 View  Result Date: 05/29/2022 CLINICAL DATA:  Port-A-Cath placement EXAM: PORTABLE CHEST 1 VIEW COMPARISON:  Radiograph 02/13/2013 FINDINGS: Chest port catheter tip overlies the mid superior vena cava. Unchanged cardiomediastinal silhouette. There is no focal airspace consolidation. Right basilar atelectasis. No pleural effusion. No pneumothorax. No acute osseous abnormality. IMPRESSION: Chest port catheter tip overlies the mid superior vena cava. No pneumothorax. Right basilar atelectasis. Electronically Signed   By: Maurine Simmering M.D.   On: 05/29/2022 10:04     ASSESSMENT:   Right hepatic lobe mass, portacaval lymphadenopathy and bone lesions: - Weight loss: 20 pounds in 6 months, decreased appetite - Reports helped going downhill since he had COVID in September 2022, 1 month later had stroke. - Elevated alkaline phosphatase noted by Dr. Brynda Greathouse - Ultrasound abdomen (03/20/2022): Right hepatic mass - MRI abdomen (04/06/2022): 11 x 12 x 8.5 cm large irregular poorly defined mass at the dome of the right hepatic lobe with numerous satellite tumors in the right hepatic lobe.  Associated tumor thrombus involving right portal vein and its branches.  Lymphadenopathy in the portacaval region.  2 enhancing bone lesions at L5 and T11 vertebral body.  Splenomegaly measuring 14 cm. - PET scan (04/21/2022): Hypermetabolic mass in the right hepatic lobe, hypermetabolic tumor extension into the  right portal vein, hypermetabolic nodal metastasis to porta hepatis and mediastinum and small supraclavicular nodes.  Hypermetabolic lesions at T2 and L5. - Pathology of liver biopsy: Poorly differentiated HCC.  Tumor is positive for glypican-3 3 and HepPar 1, negative for arginase 1.  Tumor is partially reactive for CK7 and has weak CDX2 reactivity.  Cells are negative for CK20 and MOC 31.  CT 34 shows aberrant sinusoidal reactivity in the tumor which supports diagnosis of [place] Pugh class A (6 points). - Cycle 1 of Atezolizumab and bevacizumab on 05/15/2022    Social/family history: - Lives at home with his wife.  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 - 2 pack/day smoker for last 50 years. - Mother died of colon cancer.   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 - He had severe fatigue lasting about 2 to 3 days which subsided. - Reviewed labs today which showed worsening of alk phos and AST. - UA showed protein 800. - Proceed with cycle 3 with Atezolizumab and bevacizumab. - I will obtain scans after today.  We will add AFP today. - We will give Kayexalate for mild hyperkalemia.  RTC 3 weeks for follow-up.  2.  Right-sided chest wall pain: - Continue MS Contin 30 mg twice daily and hydrocodone 10/325 every 5 hours as needed.  3.  Constipation: - Continue Colace and lactulose as needed.  4.  Proteinuria: - Patient evaluated by Dr. Theador Hawthorne. Wilder Glade was added.  Repeat 24-hour urine showed total protein 1.1 g, down from 1.7 g.  5.  Leg swelling: - Continue Lasix 20 mg daily in the mornings.  HCTZ was discontinued.  6.  Dysphagia: - He is having barium swallow with tablet later this week. - Dr. Havery Moros is planning on doing EGD in September as it has to be done in the hospital.   Orders placed this encounter:  No orders of the defined  types were placed in this encounter.    Derek Jack, MD [place] [phone]   I, Thana Ates, am acting as a scribe for Dr. Derek Jack.  I, Derek Jack MD, have reviewed the above documentation for accuracy and completeness, and I agree with the above.

## 2022-06-26 NOTE — Patient Instructions (Signed)
[place] at [facility] Discharge Instructions   You were seen and examined today by Dr. Delton Coombes.  He reviewed the results of your lab work which are normal/stable. Urine results are pending. If the total protein in the urine is 100 or less we will add Avastin to today's treatment.   Return as scheduled in 3 weeks.    Thank you for choosing [place] at [facility] to provide your oncology and hematology care.  To afford each patient quality time with our provider, please arrive at least 15 minutes before your scheduled appointment time.   If you have a lab appointment with the [facility] please come in thru the Main Entrance and check in at the main information desk.  You need to re-schedule your appointment should you arrive 10 or more minutes late.  We strive to give you quality time with our providers, and arriving late affects you and other patients whose appointments are after yours.  Also, if you no show three or more times for appointments you may be dismissed from the clinic at the providers discretion.     Again, thank you for choosing [facility].  Our hope is that these requests will decrease the amount of time that you wait before being seen by our physicians.       _____________________________________________________________  Should you have questions after your visit to [facility], please contact our office at [phone] and follow the prompts.  Our office hours are 8:00 a.m. and 4:30 p.m. Monday - Friday.  Please note that voicemails left after 4:00 p.m. may not be returned until the following business day.  We are closed weekends and major holidays.  You do have access to a nurse 24-7, just call the main number to the clinic [phone] and do not press any options, hold on the line and a nurse will answer the phone.    For prescription refill requests, have your pharmacy contact our office  and allow 72 hours.    Due to Covid, you will need to wear a mask upon entering the hospital. If you do not have a mask, a mask will be given to you at the Main Entrance upon arrival. For doctor visits, patients may have 1 support person age 69 or older with them. For treatment visits, patients can not have anyone with them due to social distancing guidelines and our immunocompromised population.

## 2022-06-26 NOTE — Patient Instructions (Signed)
[place]  Discharge Instructions: Thank you for choosing [facility] to provide your oncology and hematology care.  If you have a lab appointment with the [facility], please come in thru the Main Entrance and check in at the main information desk.  Wear comfortable clothing and clothing appropriate for easy access to any Portacath or PICC line.   We strive to give you quality time with your provider. You may need to reschedule your appointment if you arrive late (15 or more minutes).  Arriving late affects you and other patients whose appointments are after yours.  Also, if you miss three or more appointments without notifying the office, you may be dismissed from the clinic at the provider's discretion.      For prescription refill requests, have your pharmacy contact our office and allow 72 hours for refills to be completed.    Today you received the following chemotherapy and/or immunotherapy agents : Tecentriq, Bevacizumab      To help prevent nausea and vomiting after your treatment, we encourage you to take your nausea medication as directed.  BELOW ARE SYMPTOMS THAT SHOULD BE REPORTED IMMEDIATELY: *FEVER GREATER THAN 100.4 F (38 C) OR HIGHER *CHILLS OR SWEATING *NAUSEA AND VOMITING THAT IS NOT CONTROLLED WITH YOUR NAUSEA MEDICATION *UNUSUAL SHORTNESS OF BREATH *UNUSUAL BRUISING OR BLEEDING *URINARY PROBLEMS (pain or burning when urinating, or frequent urination) *BOWEL PROBLEMS (unusual diarrhea, constipation, pain near the anus) TENDERNESS IN MOUTH AND THROAT WITH OR WITHOUT PRESENCE OF ULCERS (sore throat, sores in mouth, or a toothache) UNUSUAL RASH, SWELLING OR PAIN  UNUSUAL VAGINAL DISCHARGE OR ITCHING   Items with * indicate a potential emergency and should be followed up as soon as possible or go to the Emergency Department if any problems should occur.  Please show the CHEMOTHERAPY ALERT CARD or IMMUNOTHERAPY ALERT CARD at check-in to the  Emergency Department and triage nurse.  Should you have questions after your visit or need to cancel or reschedule your appointment, please contact [facility] [phone]  and follow the prompts.  Office hours are 8:00 a.m. to 4:30 p.m. Monday - Friday. Please note that voicemails left after 4:00 p.m. may not be returned until the following business day.  We are closed weekends and major holidays. You have access to a nurse at all times for urgent questions. Please call the main number to the clinic [phone] and follow the prompts.  For any non-urgent questions, you may also contact your provider using MyChart. We now offer e-Visits for anyone 74 and older to request care online for non-urgent symptoms. For details visit [email or web address].   Also download the MyChart app! Go to the app store, search "MyChart", open the app, select [place], and log in with your MyChart username and password.  Masks are optional in the cancer centers. If you would like for your care team to wear a mask while they are taking care of you, please let them know. For doctor visits, patients may have with them one support person who is at least 69 years old. At this time, visitors are not allowed in the infusion area.

## 2022-06-27 LAB — CEA: CEA: 190 ng/mL — ABNORMAL HIGH (ref 0.0–4.7)

## 2022-06-28 ENCOUNTER — Other Ambulatory Visit: Payer: Self-pay | Admitting: *Deleted

## 2022-06-28 DIAGNOSIS — C220 Liver cell carcinoma: Secondary | ICD-10-CM

## 2022-06-30 ENCOUNTER — Encounter: Payer: No Typology Code available for payment source | Admitting: Gastroenterology

## 2022-06-30 ENCOUNTER — Encounter (HOSPITAL_COMMUNITY): Payer: Self-pay

## 2022-06-30 ENCOUNTER — Emergency Department (HOSPITAL_COMMUNITY)
Admission: EM | Admit: 2022-06-30 | Discharge: 2022-07-01 | Disposition: A | Discharge status: Home / Self Care | Payer: No Typology Code available for payment source | Attending: Emergency Medicine | Admitting: Emergency Medicine

## 2022-06-30 ENCOUNTER — Other Ambulatory Visit: Payer: Self-pay

## 2022-06-30 DIAGNOSIS — I10 Essential (primary) hypertension: Secondary | ICD-10-CM | POA: Diagnosis not present

## 2022-06-30 DIAGNOSIS — W19XXXA Unspecified fall, initial encounter: Secondary | ICD-10-CM | POA: Insufficient documentation

## 2022-06-30 DIAGNOSIS — Z7901 Long term (current) use of anticoagulants: Secondary | ICD-10-CM | POA: Diagnosis not present

## 2022-06-30 DIAGNOSIS — S0990XA Unspecified injury of head, initial encounter: Secondary | ICD-10-CM | POA: Insufficient documentation

## 2022-06-30 DIAGNOSIS — E119 Type 2 diabetes mellitus without complications: Secondary | ICD-10-CM | POA: Insufficient documentation

## 2022-06-30 DIAGNOSIS — Z8505 Personal history of malignant neoplasm of liver: Secondary | ICD-10-CM | POA: Insufficient documentation

## 2022-06-30 DIAGNOSIS — F17210 Nicotine dependence, cigarettes, uncomplicated: Secondary | ICD-10-CM | POA: Diagnosis not present

## 2022-06-30 NOTE — ED Provider Notes (Signed)
[facility] Emergency Department Provider Note MRN:  [id number]  Arrival date & time: 07/01/22     Chief Complaint   Fall   History of Present Illness   Bryce Adams is a 71 y.o. year-old male with a history of liver cancer presenting to the ED with chief complaint of fall.  Patient explains that he was being in the bushes outside this evening.  Normally he braces himself when he urinates but he did not have anything to hold onto and so he lost his balance and fell backwards.  Bump on the back of his head.  No loss of consciousness.  No neck or back pain, no chest pain or shortness of breath, no abdominal pain, chronic left hip pain that is better than normal at this time.  Review of Systems  A thorough review of systems was obtained and all systems are negative except as noted in the HPI and PMH.   Patient's Health History    Past Medical History:  Diagnosis Date   Diabetes mellitus without complication ([employer])    Hyperlipemia    Hypertension    Kidney disease    Liver cancer ([place])    Port-A-Cath in place 05/11/2022   Renal disorder    Stroke [facility])    Thyroid disease     Past Surgical History:  Procedure Laterality Date   CARDIAC CATHETERIZATION     CARPAL TUNNEL RELEASE     HAND TENDON SURGERY     PORTACATH PLACEMENT Left 05/29/2022   Procedure: INSERTION PORT-A-CATH;  Surgeon: Aviva Signs, MD;  Location: [facility];  Service: General;  Laterality: Left;    Family History  Problem Relation Age of Onset   Colon polyps Mother    Colon cancer Mother    Heart disease Father    Diabetes Father    Heart disease Paternal Grandmother    Heart disease Paternal Grandfather    Emphysema Paternal Grandfather    Diabetes Daughter    Esophageal cancer Neg Hx    Rectal cancer Neg Hx    Stomach cancer Neg Hx     Social History   Socioeconomic History   Marital status: Married    Spouse name: Not on file   Number of children: 3   Years of education: Not  on file   Highest education level: Not on file  Occupational History   Occupation: retired  Tobacco Use   Smoking status: Every Day    Packs/day: 2.00    Types: Cigarettes   Smokeless tobacco: Never  Vaping Use   Vaping Use: Never used  Substance and Sexual Activity   Alcohol use: Yes    Alcohol/week: 1.0 standard drink of alcohol    Types: 1 Cans of beer per week    Comment: occasionally    Drug use: No   Sexual activity: Not on file  Other Topics Concern   Not on file  Social History Narrative   Not on file   Social Determinants of Health   Financial Resource Strain: Not on file  Food Insecurity: Not on file  Transportation Needs: Not on file  Physical Activity: Not on file  Stress: Not on file  Social Connections: Not on file  Intimate Partner Violence: Not on file     Physical Exam   Vitals:   07/01/22 0030 07/01/22 0130  BP: (!) 145/79 (!) 142/86  Pulse: 90 92  Resp: (!) 21 18  Temp:    SpO2: 96% 96%  CONSTITUTIONAL: Well-appearing, NAD NEURO/PSYCH:  Alert and oriented x 3, no focal deficits EYES:  eyes equal and reactive ENT/NECK:  no LAD, no JVD CARDIO: Regular rate, well-perfused, normal S1 and S2 PULM:  CTAB no wheezing or rhonchi GI/GU:  non-distended, non-tender MSK/SPINE:  No gross deformities, no edema SKIN:  no rash, hematoma to occipital scalp with minor overlying abrasion   *Additional and/or pertinent findings included in MDM below  Diagnostic and Interventional Summary    EKG Interpretation  Date/Time:  Saturday July 01 2022 00:09:32 EDT Ventricular Rate:  91 PR Interval:  159 QRS Duration: 144 QT Interval:  402 QTC Calculation: 495 R Axis:   -13 Text Interpretation: Sinus rhythm Left bundle branch block Confirmed by Gerlene Fee [phone]) on 07/01/2022 2:14:37 AM       Labs Reviewed  CBG MONITORING, ED - Abnormal; Notable for the following components:      Result Value   Glucose-Capillary 169 (*)    All other components  within normal limits    CT HEAD WO CONTRAST (5MM)  Final Result    CT Cervical Spine Wo Contrast  Final Result      Medications - No data to display   Procedures  /  Critical Care Procedures  ED Course and Medical Decision Making  Initial Impression and Ddx We will need CT imaging to exclude intracranial bleeding given patient's anticoagulation use.  Having some dizziness on and off during the day today.  Had an infusion of chemo or immunotherapy few days ago and so suspect this is the cause.  Normal neurological exam at this time, doubt central vertigo.  Currently not dizzy.  Will obtain screening EKG and blood sugar and reassess.  Past medical/surgical history that increases complexity of ED encounter: Liver cancer, anticoagulated  Interpretation of Diagnostics I personally reviewed the EKG and my interpretation is as follows: Sinus rhythm, bundle branch block, unchanged from prior  CT imaging reassuring, no significant injuries  Patient Reassessment and Ultimate Disposition/Management     Discharge home  Patient management required discussion with the following services or consulting groups:  None  Complexity of Problems Addressed Acute illness or injury that poses threat of life of bodily function  Additional Data Reviewed and Analyzed Further history obtained from: Further history from spouse/family member  Additional Factors Impacting ED Encounter Risk Consideration of hospitalization  Barth Kirks. [place] Small, [place] [email or web address]  Final Clinical Impressions(s) / ED Diagnoses     ICD-10-CM   1. Fall, initial encounter  W19.XXXA     2. Injury of head, initial encounter  S09.90XA     3. Anticoagulated  Z79.01       ED Discharge Orders     None        Discharge Instructions Discussed with and Provided to Patient:     Discharge Instructions      You were evaluated in the Emergency Department  and after careful evaluation, we did not find any emergent condition requiring admission or further testing in the hospital.  Your exam/testing today is overall reassuring.  CT scans without signs of significant injury or bleeding.  Please return to the Emergency Department if you experience any worsening of your condition.   Thank you for allowing [place] to be a part of your care.       Maudie Flakes, MD 07/01/22 Reece Agar

## 2022-06-30 NOTE — ED Triage Notes (Addendum)
Pt brought in by [facility] for fall. Pt was walking in parking lot, tripped up and fell backwards, no LOC, pt has small abrasion to back of head and mid back- no active bleeding. Pt is currently being treated for liver CA. Pt takes Plavix. Pt c/o dizziness- c-collar in place from EMS

## 2022-07-01 ENCOUNTER — Emergency Department (HOSPITAL_COMMUNITY): Payer: No Typology Code available for payment source

## 2022-07-01 ENCOUNTER — Other Ambulatory Visit: Payer: Self-pay

## 2022-07-01 LAB — CBG MONITORING, ED: Glucose-Capillary: 169 mg/dL — ABNORMAL HIGH (ref 70–99)

## 2022-07-01 NOTE — Discharge Instructions (Signed)
You were evaluated in the Emergency Department and after careful evaluation, we did not find any emergent condition requiring admission or further testing in the hospital.  Your exam/testing today is overall reassuring.  CT scans without signs of significant injury or bleeding.  Please return to the Emergency Department if you experience any worsening of your condition.   Thank you for allowing [place] to be a part of your care.

## 2022-07-05 ENCOUNTER — Other Ambulatory Visit: Payer: Self-pay | Admitting: Gastroenterology

## 2022-07-05 ENCOUNTER — Ambulatory Visit (HOSPITAL_COMMUNITY)
Admission: RE | Admit: 2022-07-05 | Discharge: 2022-07-05 | Disposition: A | Discharge status: Home / Self Care | Payer: No Typology Code available for payment source | Source: Ambulatory Visit | Attending: Gastroenterology | Admitting: Gastroenterology

## 2022-07-05 DIAGNOSIS — R1310 Dysphagia, unspecified: Secondary | ICD-10-CM

## 2022-07-06 LAB — AFP TUMOR MARKER

## 2022-07-10 ENCOUNTER — Ambulatory Visit (HOSPITAL_COMMUNITY)
Admission: RE | Admit: 2022-07-10 | Discharge: 2022-07-10 | Disposition: A | Discharge status: Home / Self Care | Payer: No Typology Code available for payment source | Source: Ambulatory Visit | Attending: Hematology | Admitting: Hematology

## 2022-07-10 DIAGNOSIS — C220 Liver cell carcinoma: Secondary | ICD-10-CM | POA: Insufficient documentation

## 2022-07-10 MED ORDER — HEPARIN SOD (PORK) LOCK FLUSH 100 UNIT/ML IV SOLN
INTRAVENOUS | Status: AC
  Administered 2022-07-10: 500 [IU] via INTRAVENOUS
  Filled 2022-07-10: qty 5

## 2022-07-10 MED ORDER — IOHEXOL 300 MG/ML  SOLN
100.0000 mL | Freq: Once | INTRAMUSCULAR | Status: AC | PRN
  Administered 2022-07-10: 100 mL via INTRAVENOUS

## 2022-07-12 ENCOUNTER — Other Ambulatory Visit: Payer: Self-pay

## 2022-07-12 MED ORDER — DRONABINOL 5 MG PO CAPS: 5.0000 mg | ORAL_CAPSULE | Freq: Two times a day (BID) | ORAL | 2 refills | Status: AC

## 2022-07-12 MED ORDER — MORPHINE SULFATE ER 30 MG PO TBCR: 30.0000 mg | EXTENDED_RELEASE_TABLET | Freq: Two times a day (BID) | ORAL | 0 refills | Status: AC

## 2022-07-14 ENCOUNTER — Other Ambulatory Visit: Payer: Self-pay

## 2022-07-14 DIAGNOSIS — C220 Liver cell carcinoma: Secondary | ICD-10-CM

## 2022-07-17 ENCOUNTER — Inpatient Hospital Stay: Payer: No Typology Code available for payment source | Attending: Hematology

## 2022-07-17 ENCOUNTER — Inpatient Hospital Stay (HOSPITAL_BASED_OUTPATIENT_CLINIC_OR_DEPARTMENT_OTHER): Payer: No Typology Code available for payment source | Admitting: Hematology

## 2022-07-17 ENCOUNTER — Inpatient Hospital Stay: Payer: No Typology Code available for payment source

## 2022-07-17 VITALS — BP 108/62 | HR 94 | Temp 96.3°F | Resp 20

## 2022-07-17 DIAGNOSIS — C7951 Secondary malignant neoplasm of bone: Secondary | ICD-10-CM | POA: Insufficient documentation

## 2022-07-17 DIAGNOSIS — M7989 Other specified soft tissue disorders: Secondary | ICD-10-CM | POA: Diagnosis not present

## 2022-07-17 DIAGNOSIS — C220 Liver cell carcinoma: Secondary | ICD-10-CM | POA: Diagnosis present

## 2022-07-17 DIAGNOSIS — C778 Secondary and unspecified malignant neoplasm of lymph nodes of multiple regions: Secondary | ICD-10-CM | POA: Diagnosis not present

## 2022-07-17 DIAGNOSIS — Z95828 Presence of other vascular implants and grafts: Secondary | ICD-10-CM

## 2022-07-17 DIAGNOSIS — F17210 Nicotine dependence, cigarettes, uncomplicated: Secondary | ICD-10-CM | POA: Diagnosis not present

## 2022-07-17 DIAGNOSIS — E119 Type 2 diabetes mellitus without complications: Secondary | ICD-10-CM | POA: Insufficient documentation

## 2022-07-17 DIAGNOSIS — I10 Essential (primary) hypertension: Secondary | ICD-10-CM | POA: Diagnosis not present

## 2022-07-17 LAB — CBC WITH DIFFERENTIAL/PLATELET
Abs Immature Granulocytes: 0.53 10*3/uL — ABNORMAL HIGH (ref 0.00–0.07)
Basophils Absolute: 0.1 10*3/uL (ref 0.0–0.1)
Basophils Relative: 1 %
Eosinophils Absolute: 0 10*3/uL (ref 0.0–0.5)
Eosinophils Relative: 0 %
HCT: 43.5 % (ref 39.0–52.0)
Hemoglobin: 13.5 g/dL (ref 13.0–17.0)
Immature Granulocytes: 3 %
Lymphocytes Relative: 5 %
Lymphs Abs: 1.1 10*3/uL (ref 0.7–4.0)
MCH: 27.5 pg (ref 26.0–34.0)
MCHC: 31 g/dL (ref 30.0–36.0)
MCV: 88.6 fL (ref 80.0–100.0)
Monocytes Absolute: 1.8 10*3/uL — ABNORMAL HIGH (ref 0.1–1.0)
Monocytes Relative: 9 %
Neutro Abs: 16.5 10*3/uL — ABNORMAL HIGH (ref 1.7–7.7)
Neutrophils Relative %: 82 %
Platelets: 338 10*3/uL (ref 150–400)
RBC: 4.91 MIL/uL (ref 4.22–5.81)
RDW: 18.7 % — ABNORMAL HIGH (ref 11.5–15.5)
WBC: 20 10*3/uL — ABNORMAL HIGH (ref 4.0–10.5)
nRBC: 0 % (ref 0.0–0.2)

## 2022-07-17 LAB — COMPREHENSIVE METABOLIC PANEL
ALT: 66 U/L — ABNORMAL HIGH (ref 0–44)
AST: 93 U/L — ABNORMAL HIGH (ref 15–41)
Albumin: 2.6 g/dL — ABNORMAL LOW (ref 3.5–5.0)
Alkaline Phosphatase: 774 U/L — ABNORMAL HIGH (ref 38–126)
Anion gap: 13 (ref 5–15)
BUN: 54 mg/dL — ABNORMAL HIGH (ref 8–23)
CO2: 22 mmol/L (ref 22–32)
Calcium: 10.5 mg/dL — ABNORMAL HIGH (ref 8.9–10.3)
Chloride: 100 mmol/L (ref 98–111)
Creatinine, Ser: 1.17 mg/dL (ref 0.61–1.24)
GFR, Estimated: >60 mL/min (ref >60)
Glucose, Bld: 121 mg/dL — ABNORMAL HIGH (ref 70–99)
Potassium: 5.4 mmol/L — ABNORMAL HIGH (ref 3.5–5.1)
Sodium: 135 mmol/L (ref 135–145)
Total Bilirubin: 1.4 mg/dL — ABNORMAL HIGH (ref 0.3–1.2)
Total Protein: 6.9 g/dL (ref 6.5–8.1)

## 2022-07-17 LAB — TSH: TSH: 7.304 u[IU]/mL — ABNORMAL HIGH (ref 0.350–4.500)

## 2022-07-17 LAB — MAGNESIUM: Magnesium: 2.3 mg/dL (ref 1.7–2.4)

## 2022-07-17 MED ORDER — SODIUM CHLORIDE 0.9 % IV SOLN: Freq: Once | INTRAVENOUS | Status: AC

## 2022-07-17 MED ORDER — ZOLEDRONIC ACID 4 MG/100ML IV SOLN
4.0000 mg | Freq: Once | INTRAVENOUS | Status: AC
  Administered 2022-07-17: 4 mg via INTRAVENOUS
  Filled 2022-07-17: qty 100

## 2022-07-17 MED ORDER — HEPARIN SOD (PORK) LOCK FLUSH 100 UNIT/ML IV SOLN
500.0000 [IU] | Freq: Once | INTRAVENOUS | Status: AC
  Administered 2022-07-17: 500 [IU] via INTRAVENOUS

## 2022-07-17 MED ORDER — SODIUM CHLORIDE 0.9% FLUSH
10.0000 mL | Freq: Once | INTRAVENOUS | Status: AC
  Administered 2022-07-17: 10 mL via INTRAVENOUS

## 2022-07-17 NOTE — Patient Instructions (Signed)
[facility]  Discharge Instructions: Thank you for choosing [facility] to provide your oncology and hematology care.  If you have a lab appointment with the [facility], please come in thru the Main Entrance and check in at the main information desk.  Wear comfortable clothing and clothing appropriate for easy access to any Portacath or PICC line.   We strive to give you quality time with your provider. You may need to reschedule your appointment if you arrive late (15 or more minutes).  Arriving late affects you and other patients whose appointments are after yours.  Also, if you miss three or more appointments without notifying the office, you may be dismissed from the clinic at the provider's discretion.      For prescription refill requests, have your pharmacy contact our office and allow 72 hours for refills to be completed.    Today you received the following hydration fluids and zometa, return as scheduled.   To help prevent nausea and vomiting after your treatment, we encourage you to take your nausea medication as directed.  BELOW ARE SYMPTOMS THAT SHOULD BE REPORTED IMMEDIATELY: *FEVER GREATER THAN 100.4 F (38 C) OR HIGHER *CHILLS OR SWEATING *NAUSEA AND VOMITING THAT IS NOT CONTROLLED WITH YOUR NAUSEA MEDICATION *UNUSUAL SHORTNESS OF BREATH *UNUSUAL BRUISING OR BLEEDING *URINARY PROBLEMS (pain or burning when urinating, or frequent urination) *BOWEL PROBLEMS (unusual diarrhea, constipation, pain near the anus) TENDERNESS IN MOUTH AND THROAT WITH OR WITHOUT PRESENCE OF ULCERS (sore throat, sores in mouth, or a toothache) UNUSUAL RASH, SWELLING OR PAIN  UNUSUAL VAGINAL DISCHARGE OR ITCHING   Items with * indicate a potential emergency and should be followed up as soon as possible or go to the Emergency Department if any problems should occur.  Please show the CHEMOTHERAPY ALERT CARD or IMMUNOTHERAPY ALERT CARD at check-in to the Emergency  Department and triage nurse.  Should you have questions after your visit or need to cancel or reschedule your appointment, please contact [facility] [phone]  and follow the prompts.  Office hours are 8:00 a.m. to 4:30 p.m. Monday - Friday. Please note that voicemails left after 4:00 p.m. may not be returned until the following business day.  We are closed weekends and major holidays. You have access to a nurse at all times for urgent questions. Please call the main number to the clinic [phone] and follow the prompts.  For any non-urgent questions, you may also contact your provider using MyChart. We now offer e-Visits for anyone 39 and older to request care online for non-urgent symptoms. For details visit [email or web address].   Also download the MyChart app! Go to the app store, search "MyChart", open the app, select [facility], and log in with your MyChart username and password.  Masks are optional in the cancer centers. If you would like for your care team to wear a mask while they are taking care of you, please let them know. You may have one support person who is at least 69 years old accompany you for your appointments.

## 2022-07-17 NOTE — Progress Notes (Signed)
Patient presents to clinic today for office visit with Dr. Delton Coombes. Wife informed staff of new decubitus ulcer. Staff visualized approximately 1in x 1in reddened, scabbed ulcer on sacrum. No drainage noted. Dr. Delton Coombes made aware.

## 2022-07-17 NOTE — Progress Notes (Signed)
Patients port flushed without difficulty.  Good blood return noted with no bruising or swelling noted at site.  Patient remains accessed for chemotherapy treatment.  

## 2022-07-17 NOTE — Progress Notes (Signed)
Patient presents today for possible treatment.  No treatment today per Dr. Raliegh Ip patient is going to hospice. Patient is hypercalcemic 1 L of Normal Saline and Zometa ordered per Dr. Delton Coombes. Patient tolerated hydration therapy with no complaints voiced. Side effects with management reviewed with understanding verbalized. Port site clean and dry with no bruising or swelling noted at site. Good blood return noted before and after administration of therapy. Band aid applied. Patient left in satisfactory condition with VSS and no s/s of distress noted.

## 2022-07-17 NOTE — Progress Notes (Signed)
[place], [place] [address]   CLINIC:  Medical Oncology/Hematology  PCP:  Eber Hong, MD [id number] / [place] [address] [phone]   REASON FOR VISIT:  Follow-up for right hepatic lobe mass  PRIOR THERAPY: none  NGS Results: not done  CURRENT THERAPY: Atezolizumab + Bevacizumab q21d Maintenance  BRIEF ONCOLOGIC HISTORY:  Oncology History  Hepatocellular carcinoma ([place])  05/08/2022 Initial Diagnosis   Hepatocellular carcinoma ([place])   05/15/2022 -  Chemotherapy   Patient is on Treatment Plan : LIVER Atezolizumab + Bevacizumab q21d Maintenance       CANCER STAGING:  Cancer Staging  Hepatocellular carcinoma ([place]) Staging form: Liver, AJCC 8th Edition - Clinical stage from 05/08/2022: Stage IVB (cT4, cN1, pM1) - Unsigned   INTERVAL HISTORY:  Bryce Adams, a 71 y.o. male, seen for follow-up of poorly differentiated hepatocellular carcinoma.  He has completed 3 cycles of Atezolizumab and bevacizumab.  Wife and daughter are present today along with him.  He has clearly deteriorated in the last 2 to 3 weeks.   REVIEW OF SYSTEMS:  Review of Systems  Constitutional:  Positive for fatigue. Negative for appetite change and unexpected weight change.  Respiratory:  Positive for chest tightness and shortness of breath. Negative for cough.   Cardiovascular:  Positive for leg swelling (mild).  Gastrointestinal:  Positive for nausea. Negative for abdominal pain, constipation and diarrhea.  Musculoskeletal:  Positive for back pain (stable).  Psychiatric/Behavioral:  Positive for depression. Negative for sleep disturbance. The patient is nervous/anxious.   All other systems reviewed and are negative.   PAST MEDICAL/SURGICAL HISTORY:  Past Medical History:  Diagnosis Date   Diabetes mellitus without complication ([place])    Hyperlipemia    Hypertension    Kidney disease    Liver cancer ([place])    Port-A-Cath in place 05/11/2022    Renal disorder    Stroke [facility])    Thyroid disease    Past Surgical History:  Procedure Laterality Date   CARDIAC CATHETERIZATION     CARPAL TUNNEL RELEASE     HAND TENDON SURGERY     PORTACATH PLACEMENT Left 05/29/2022   Procedure: INSERTION PORT-A-CATH;  Surgeon: Aviva Signs, MD;  Location: [facility];  Service: General;  Laterality: Left;    SOCIAL HISTORY:  Social History   Socioeconomic History   Marital status: Married    Spouse name: Not on file   Number of children: 3   Years of education: Not on file   Highest education level: Not on file  Occupational History   Occupation: retired  Tobacco Use   Smoking status: Every Day    Packs/day: 2.00    Types: Cigarettes   Smokeless tobacco: Never  Vaping Use   Vaping Use: Never used  Substance and Sexual Activity   Alcohol use: Yes    Alcohol/week: 1.0 standard drink of alcohol    Types: 1 Cans of beer per week    Comment: occasionally    Drug use: No   Sexual activity: Not on file  Other Topics Concern   Not on file  Social History Narrative   Not on file   Social Determinants of Health   Financial Resource Strain: Not on file  Food Insecurity: Not on file  Transportation Needs: Not on file  Physical Activity: Not on file  Stress: Not on file  Social Connections: Not on file  Intimate Partner Violence: Not on file    FAMILY HISTORY:  Family History  Problem Relation Age of Onset   Colon polyps Mother    Colon cancer Mother    Heart disease Father    Diabetes Father    Heart disease Paternal Grandmother    Heart disease Paternal Grandfather    Emphysema Paternal Grandfather    Diabetes Daughter    Esophageal cancer Neg Hx    Rectal cancer Neg Hx    Stomach cancer Neg Hx     CURRENT MEDICATIONS:  Current Outpatient Medications  Medication Sig Dispense Refill   Atezolizumab (TECENTRIQ IV) Inject into the vein every 21 ( twenty-one) days.     atorvastatin (LIPITOR) 20 MG tablet Take 20 mg by mouth  every morning.     Bevacizumab (AVASTIN IV) Inject into the vein every 21 ( twenty-one) days.     calcium carbonate (OSCAL) 1500 (600 Ca) MG TABS tablet Take 600 mg of elemental calcium by mouth daily.     clopidogrel (PLAVIX) 75 MG tablet Take 1 tablet (75 mg total) by mouth daily. 30 tablet 11   Continuous Blood Gluc Sensor (DEXCOM G6 SENSOR) MISC Apply topically daily.     dronabinol (MARINOL) 5 MG capsule Take 1 capsule (5 mg total) by mouth 2 (two) times daily before a meal. 60 capsule 2   famotidine (PEPCID) 20 MG tablet Take 20 mg by mouth 2 (two) times daily. Once in A.m. and Once in P.M.     furosemide (LASIX) 20 MG tablet Take 1 tablet (20 mg total) by mouth daily as needed. 30 tablet 3   HYDROcodone-acetaminophen (NORCO) 10-325 MG tablet Take 1 tablet by mouth every 4 (four) hours as needed. 180 tablet 0   Insulin Pen Needle (B-D UF III MINI PEN NEEDLES) 31G X 5 MM MISC USE EVERY DAY     irbesartan (AVAPRO) 300 MG tablet Take 300 mg by mouth daily.     Lactulose 20 GM/30ML SOLN Take 30 mLs (20 g total) by mouth every 3 (three) hours as needed. 450 mL 1   levothyroxine (SYNTHROID, LEVOTHROID) 50 MCG tablet Take 50 mcg by mouth every morning.     lidocaine-prilocaine (EMLA) cream Apply a small amount to port a cath site and cover with plastic wrap 1 hour prior to infusion appointments (Patient not taking: Reported on 06/26/2022) 30 g 3   Magnesium 250 MG TABS Take 250 mg by mouth in the morning and at bedtime.     megestrol (MEGACE) 400 MG/10ML suspension Take 10 mLs (400 mg total) by mouth 2 (two) times daily. 480 mL 4   metFORMIN (GLUCOPHAGE) 1000 MG tablet Take 2,000 mg by mouth 2 (two) times daily with a meal.     morphine (MS CONTIN) 30 MG 12 hr tablet Take 1 tablet (30 mg total) by mouth every 12 (twelve) hours. 60 tablet 0   Multiple Vitamins-Minerals (CENTRUM SILVER 50+MEN) TABS Take 1 tablet by mouth daily.     Omega-3 Fatty Acids (FISH OIL) 1200 MG CAPS Take 1,200 mg by mouth in  the morning and at bedtime.     ondansetron (ZOFRAN) 8 MG tablet Take 1 tablet (8 mg total) by mouth every 8 (eight) hours as needed for nausea or vomiting. (Patient not taking: Reported on 06/26/2022) 90 tablet 2   ONETOUCH VERIO test strip daily.     sitaGLIPtin (JANUVIA) 100 MG tablet Take 100 mg by mouth daily.     VICTOZA 18 MG/3ML SOPN Inject 1.8 mg into the skin every evening.  4  No current facility-administered medications for this visit.    ALLERGIES:  Allergies  Allergen Reactions   Fentanyl Shortness Of Breath   Compazine [Prochlorperazine]     Hallucinations, vivid dreams    PHYSICAL EXAM:  Performance status (ECOG): 1 - Symptomatic but completely ambulatory  Vitals:   07/17/22 0936  BP: 108/62  Pulse: 94  Resp: 20  Temp: (!) 96.3 F (35.7 C)  SpO2: 94%   Wt Readings from Last 3 Encounters:  06/30/22 159 lb 3.2 oz (72.2 kg)  06/26/22 159 lb 3.2 oz (72.2 kg)  06/15/22 163 lb (73.9 kg)   Physical Exam Vitals reviewed.  Constitutional:      Appearance: Normal appearance.     Comments: In wheelchair  Cardiovascular:     Rate and Rhythm: Normal rate and regular rhythm.     Pulses: Normal pulses.     Heart sounds: Normal heart sounds.  Pulmonary:     Effort: Pulmonary effort is normal.     Breath sounds: Normal breath sounds.  Neurological:     General: No focal deficit present.     Mental Status: He is alert and oriented to person, place, and time.  Psychiatric:        Mood and Affect: Mood normal.        Behavior: Behavior normal.     LABORATORY DATA:  I have reviewed the labs as listed.     Latest Ref Rng & Units 07/17/2022    9:57 AM 06/26/2022    9:28 AM 06/15/2022   10:04 AM  CBC  WBC 4.0 - 10.5 K/uL 20.0  14.4  14.2   Hemoglobin 13.0 - 17.0 g/dL 13.5  11.7  12.6   Hematocrit 39.0 - 52.0 % 43.5  37.3  40.2   Platelets 150 - 400 K/uL 338  313  412       Latest Ref Rng & Units 07/17/2022    9:57 AM 06/26/2022    9:28 AM 06/15/2022   10:04  AM  CMP  Glucose 70 - 99 mg/dL 121  118  140   BUN 8 - 23 mg/dL 54  36  43   Creatinine 0.61 - 1.24 mg/dL 1.17  1.00  1.27   Sodium 135 - 145 mmol/L 135  131  131   Potassium 3.5 - 5.1 mmol/L 5.4  5.4  4.9   Chloride 98 - 111 mmol/L 100  99  97   CO2 22 - 32 mmol/L '22  24  24   '$ Calcium 8.9 - 10.3 mg/dL 10.5  9.8  9.9    9.7   Total Protein 6.5 - 8.1 g/dL 6.9  7.2  8.3   Total Bilirubin 0.3 - 1.2 mg/dL 1.4  1.2  0.9   Alkaline Phos 38 - 126 U/L 774  521  423   AST 15 - 41 U/L 93  60  56   ALT 0 - 44 U/L 66  44  34     DIAGNOSTIC IMAGING:  I have independently reviewed the scans and discussed with the patient. CT CHEST ABDOMEN PELVIS W CONTRAST  Result Date: 07/11/2022 CLINICAL DATA:  Hepatocellular carcinoma restaging, ongoing chemotherapy * Tracking Code: BO * EXAM: CT CHEST, ABDOMEN, AND PELVIS WITH CONTRAST TECHNIQUE: Multidetector CT imaging of the chest, abdomen and pelvis was performed following the standard protocol during bolus administration of intravenous contrast. RADIATION DOSE REDUCTION: This exam was performed according to the departmental dose-optimization program which includes automated exposure control, adjustment of  the mA and/or kV according to patient size and/or use of iterative reconstruction technique. CONTRAST:  179m OMNIPAQUE IOHEXOL 300 MG/ML SOLN additional oral enteric contrast COMPARISON:  PET-CT, 04/20/2022, MR abdomen, 04/06/2022 FINDINGS: CT CHEST FINDINGS Cardiovascular: Left chest port catheter. Aortic atherosclerosis. Aortic valve calcifications. Normal heart size Three-vessel coronary artery calcifications. No pericardial effusion. Mediastinum/Nodes: Significant interval increase in size of bulky mediastinal and right hilar lymph nodes, largest pretracheal node measuring 2.8 x 2.5 cm, previously 2.1 x 1.7 cm (series 7, image 60). Thyroid gland, trachea, and esophagus demonstrate no significant findings. Lungs/Pleura: Numerous new small bilateral pulmonary  nodules, for example a 0.5 cm nodule of the peripheral right lower lobe (series 9, image 95) and a 0.5 cm nodule of the dependent left lower lobe (series 9, image 90). New, moderate right pleural effusion. Musculoskeletal: No chest wall abnormality. No acute osseous findings. CT ABDOMEN PELVIS FINDINGS Hepatobiliary: Significant interval enlargement of a large, hypoenhancing mass occupying the majority of the right lobe of the liver, measuring at least 15.2 x 13.6 cm, previously 11.9 x 11.5 cm (series 7, image 121). Extensive tumor thrombus again seen in the right portal vein (series 7, image 149). Multiple new small hypoenhancing satellite lesions in the left lobe of the liver, for example in the tip of the left lobe of the liver, hepatic segment III measuring 1.7 x 1.6 cm (series 7, image 177). Small gallstones. Small volume pericholecystic fluid, nonspecific in the setting of ascites. No biliary ductal dilatation. Pancreas: Unremarkable. No pancreatic ductal dilatation or surrounding inflammatory changes. Spleen: Normal in size without significant abnormality. Adrenals/Urinary Tract: Adrenal glands are unremarkable. Kidneys are normal, without renal calculi, solid lesion, or hydronephrosis. Bladder is unremarkable. Stomach/Bowel: Stomach is within normal limits. Appendix appears normal. No evidence of bowel wall thickening, distention, or inflammatory changes. Vascular/Lymphatic: Aortic atherosclerosis. Bulky, hypoenhancing portacaval and porta hepatis lymphadenopathy, largest portacaval node conglomerate measuring 4.5 x 2.7 cm, previously 0.5 x 2.3 cm (series 7, image 158). Reproductive: No mass or other abnormality. Other: No abdominal wall hernia or abnormality. New small volume ascites throughout the abdomen pelvis. Musculoskeletal: No acute osseous findings. IMPRESSION: 1. Significant interval enlargement of a large, hypoenhancing mass occupying the majority of the right lobe of the liver. Multiple new small  hypoenhancing satellite lesions in the left lobe of the liver. 2. Significant interval increase in size of bulky mediastinal and right hilar lymph nodes, as well as bulky portacaval and porta hepatis lymphadenopathy. 3. Numerous new small bilateral pulmonary nodules, as well as a new, moderate right pleural effusion. 4. New small volume ascites throughout the abdomen and pelvis. 5. Constellation of findings is consistent with worsened hepatocellular carcinoma and metastatic disease. 6. No CT correlate of previously suspected hypermetabolic osseous metastatic lesions of T2 and L5. 7. Coronary artery disease. 8. Aortic valve calcifications. Correlate for echocardiographic evidence of aortic valve dysfunction. Aortic Atherosclerosis (ICD10-I70.0). Electronically Signed   By: ADelanna AhmadiM.D.   On: 07/11/2022 12:16   DG ESOPHAGUS W SINGLE CM (SOL OR THIN BA)  Result Date: 07/05/2022 CLINICAL DATA:  71year old male with complaints of difficulty swallowing. He has recently had a fall evaluated in the emergency department 07/01/2022 and has complaints of both unsteadiness and on going vertigo EXAM: ESOPHOGRAM/BARIUM SWALLOW TECHNIQUE: Single contrast examination was performed using thin barium as a limited study given his inability to stand independently. FLUOROSCOPY: Radiation Exposure Index (as provided by the fluoroscopic device): 50.2 mGy Kerma COMPARISON:  None FINDINGS: Study was performed as a  limited examination in the recumbent position given his inability to stand. He also has complaints of vertigo with change in position Swallowing: Appears normal. No vestibular penetration or aspiration seen. Pharynx: Unremarkable. Esophagus: No stricture or obstruction. Esophageal motility: Esophageal dysmotility identified with evidence of esophageal spasm and slow transit of material. In the recumbent position material within the esophagus was static, and only cleared in a 45 degree position with water ingestion.  Hiatal Hernia: None. Gastroesophageal reflux: None visualized. Ingested 62m barium tablet: The patient was unable to manipulate the tablet through the oral phase of swallowing and he chewed the tablet in order to swallow the remnants with water. Other: None. IMPRESSION: Limited single-contrast esophagram demonstrates no evidence of aspiration/penetration. No hiatal hernia Esophageal dysmotility with esophageal spasm He was unable to manipulate the barium tablet in 1 piece through the oral phase, and needed to chew the tablet in order to swallow the remnants. Electronically Signed   By: JCorrie MckusickD.O.   On: 07/05/2022 10:55   CT HEAD WO CONTRAST (5MM)  Result Date: 07/01/2022 CLINICAL DATA:  Fall, head and neck trauma. EXAM: CT HEAD WITHOUT CONTRAST CT CERVICAL SPINE WITHOUT CONTRAST TECHNIQUE: Multidetector CT imaging of the head and cervical spine was performed following the standard protocol without intravenous contrast. Multiplanar CT image reconstructions of the cervical spine were also generated. RADIATION DOSE REDUCTION: This exam was performed according to the departmental dose-optimization program which includes automated exposure control, adjustment of the mA and/or kV according to patient size and/or use of iterative reconstruction technique. COMPARISON:  09/12/2021. FINDINGS: CT HEAD FINDINGS Brain: No acute intracranial hemorrhage, midline shift or mass effect. No extra-axial fluid collection. Generalized atrophy is noted. Periventricular white matter hypodensities are present bilaterally. No hydrocephalus. Vascular: Atherosclerotic calcification of the carotid siphons. No hyperdense vessel. Skull: Normal. Negative for fracture or focal lesion. Sinuses/Orbits: No acute finding. Other: A few opacities are noted in the mastoid air cells on the right which are unchanged from the prior exam. CT CERVICAL SPINE FINDINGS Alignment: Normal. Skull base and vertebrae: No acute fracture. No primary bone  lesion or focal pathologic process. Soft tissues and spinal canal: No prevertebral fluid or swelling. No visible canal hematoma. Disc levels: Intervertebral disc space narrowing and degenerative endplate changes and facet arthropathy is noted. Upper chest: A layering pleural effusion is present on the right. Other: Carotid artery calcifications are noted. IMPRESSION: 1. No acute intracranial process. 2. Atrophy with chronic microvascular ischemic changes. 3. Degenerative changes in the cervical spine without evidence of acute fracture. Electronically Signed   By: LBrett FairyM.D.   On: 07/01/2022 01:37   CT Cervical Spine Wo Contrast  Result Date: 07/01/2022 CLINICAL DATA:  Fall, head and neck trauma. EXAM: CT HEAD WITHOUT CONTRAST CT CERVICAL SPINE WITHOUT CONTRAST TECHNIQUE: Multidetector CT imaging of the head and cervical spine was performed following the standard protocol without intravenous contrast. Multiplanar CT image reconstructions of the cervical spine were also generated. RADIATION DOSE REDUCTION: This exam was performed according to the departmental dose-optimization program which includes automated exposure control, adjustment of the mA and/or kV according to patient size and/or use of iterative reconstruction technique. COMPARISON:  09/12/2021. FINDINGS: CT HEAD FINDINGS Brain: No acute intracranial hemorrhage, midline shift or mass effect. No extra-axial fluid collection. Generalized atrophy is noted. Periventricular white matter hypodensities are present bilaterally. No hydrocephalus. Vascular: Atherosclerotic calcification of the carotid siphons. No hyperdense vessel. Skull: Normal. Negative for fracture or focal lesion. Sinuses/Orbits: No acute finding. Other: A  few opacities are noted in the mastoid air cells on the right which are unchanged from the prior exam. CT CERVICAL SPINE FINDINGS Alignment: Normal. Skull base and vertebrae: No acute fracture. No primary bone lesion or focal  pathologic process. Soft tissues and spinal canal: No prevertebral fluid or swelling. No visible canal hematoma. Disc levels: Intervertebral disc space narrowing and degenerative endplate changes and facet arthropathy is noted. Upper chest: A layering pleural effusion is present on the right. Other: Carotid artery calcifications are noted. IMPRESSION: 1. No acute intracranial process. 2. Atrophy with chronic microvascular ischemic changes. 3. Degenerative changes in the cervical spine without evidence of acute fracture. Electronically Signed   By: Brett Fairy M.D.   On: 07/01/2022 01:37   US RENAL  Result Date: 06/21/2022 CLINICAL DATA:  Chronic renal disease.  Proteinuria. EXAM: RENAL / URINARY TRACT ULTRASOUND COMPLETE COMPARISON:  None Available. FINDINGS: Right Kidney: Renal measurements: 13.4 x 6.8 x 7.3 cm = volume: 347 mL. Echogenicity within normal limits. No mass or hydronephrosis visualized. Left Kidney: Renal measurements: 12.8 x 7.2 x 6.8 cm = volume: 327 mL. Contains a 1.1 cm cyst. No follow-up imaging recommended for the cyst. Bladder: Appears normal for degree of bladder distention. Other: Ascites in the pelvis. IMPRESSION: 1. The kidneys are unremarkable. Small postvoid residual in the bladder. 2. Ascites in the pelvis. Electronically Signed   By: Dorise Bullion III M.D.   On: 06/21/2022 13:03     ASSESSMENT:   Right hepatic lobe mass, portacaval lymphadenopathy and bone lesions: - Weight loss: 20 pounds in 6 months, decreased appetite - Reports helped going downhill since he had COVID in September 2022, 1 month later had stroke. - Elevated alkaline phosphatase noted by Dr. Brynda Greathouse - Ultrasound abdomen (03/20/2022): Right hepatic mass - MRI abdomen (04/06/2022): 11 x 12 x 8.5 cm large irregular poorly defined mass at the dome of the right hepatic lobe with numerous satellite tumors in the right hepatic lobe.  Associated tumor thrombus involving right portal vein and its branches.   Lymphadenopathy in the portacaval region.  2 enhancing bone lesions at L5 and T11 vertebral body.  Splenomegaly measuring 14 cm. - PET scan (04/21/2022): Hypermetabolic mass in the right hepatic lobe, hypermetabolic tumor extension into the right portal vein, hypermetabolic nodal metastasis to porta hepatis and mediastinum and small supraclavicular nodes.  Hypermetabolic lesions at T2 and L5. - Pathology of liver biopsy: Poorly differentiated HCC.  Tumor is positive for glypican-3 3 and HepPar 1, negative for arginase 1.  Tumor is partially reactive for CK7 and has weak CDX2 reactivity.  Cells are negative for CK20 and MOC 31.  CT 34 shows aberrant sinusoidal reactivity in the tumor which supports diagnosis of [place] Pugh class A (6 points). - Cycle 1 of Atezolizumab and bevacizumab on 05/15/2022    Social/family history: - Lives at home with his wife.  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 - 2 pack/day smoker for last 50 years. - Mother died of colon cancer.   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  I have reviewed labs today which showed hypercalcemia with albumin 2.6.  Recommend Zometa and 1 L of fluid.       -Due to worsening of his performance status, I have recommended best supportive care in the form of hospice.  I will start deprescribing most of his medications.  He will stop Lipitor, Plavix, calcium carbonate, Megace, Avapro, Pepcid, omega-3 fatty acids.  He already cut back on diabetic medications due to decreased oral intake.  We will make a referral to [facility].  2.  Right-sided chest wall pain: - Continue MS Contin 30 mg twice daily and hydrocodone 10/325 every 6 hours as needed.  3.  Constipation: - Continue Colace and lactulose as needed.  4.  Leg swelling: - Continue Lasix 20 mg daily  as needed.  5.  Leg swelling: - Continue Lasix 20 mg daily in the mornings.  HCTZ was discontinued.   Orders placed this encounter:  No orders of the defined types were placed in this encounter.    Derek Jack, MD [place] [phone]

## 2022-07-20 ENCOUNTER — Telehealth: Payer: Self-pay | Admitting: Gastroenterology

## 2022-07-20 NOTE — Telephone Encounter (Signed)
Patients wife called and needs to cancel the appointment for her husband on 08/03/2022 in the hospital with [facility]. Patient has been placed on hospice care so he no longer needs the appointment. Thank you

## 2022-07-20 NOTE — Telephone Encounter (Signed)
EGD at [facility] on 08/03/22 has been cancelled.

## 2022-07-20 NOTE — Telephone Encounter (Signed)
Sorry to hear this. Thanks for letting me know. [place] can you please take him off the schedule and we can discuss who can use that spot. Thanks

## 2022-07-23 ENCOUNTER — Other Ambulatory Visit: Payer: Self-pay

## 2022-07-28 ENCOUNTER — Ambulatory Visit: Payer: No Typology Code available for payment source | Admitting: Gastroenterology

## 2022-08-03 ENCOUNTER — Ambulatory Visit (HOSPITAL_COMMUNITY)
Admission: RE | Admit: 2022-08-03 | Payer: No Typology Code available for payment source | Source: Home / Self Care | Admitting: Gastroenterology

## 2022-08-03 ENCOUNTER — Encounter (HOSPITAL_COMMUNITY): Admission: RE | Payer: Self-pay | Source: Home / Self Care

## 2022-08-03 SURGERY — ESOPHAGOGASTRODUODENOSCOPY (EGD) WITH PROPOFOL
Anesthesia: Monitor Anesthesia Care

## 2022-08-27 DEATH — deceased

## 2022-09-22 LAB — UIFE/LIGHT CHAINS/TP QN, 24-HR UR
FR KAPPA LT CH,24HR: 81.83 mg/24 hr
FR LAMBDA LT CH,24HR: 15.96 mg/24 hr
Free Kappa Lt Chains,Ur: 81.83 mg/L (ref 1.17–86.46)
Free Kappa/Lambda Ratio: 5.13 (ref 1.83–14.26)
Free Lambda Lt Chains,Ur: 15.96 mg/L — ABNORMAL HIGH (ref 0.27–15.21)
Total Protein, Urine-Ur/day: 1115 mg/24 hr — ABNORMAL HIGH (ref 30–150)
Total Protein, Urine: 111.5 mg/dL
